# Patient Record
Sex: Female | Born: 1960 | Race: Black or African American | Hispanic: No | Marital: Married | Smoking: Never smoker
Health system: Southern US, Community
[De-identification: ages and names within clinical notes are randomized; demographics above are authoritative.]

## PROBLEM LIST (undated history)

## (undated) DIAGNOSIS — L89309 Pressure ulcer of unspecified buttock, unspecified stage: Secondary | ICD-10-CM

## (undated) DIAGNOSIS — M069 Rheumatoid arthritis, unspecified: Secondary | ICD-10-CM

## (undated) DIAGNOSIS — L8990 Pressure ulcer of unspecified site, unspecified stage: Secondary | ICD-10-CM

## (undated) DIAGNOSIS — A492 Hemophilus influenzae infection, unspecified site: Secondary | ICD-10-CM

## (undated) DIAGNOSIS — D649 Anemia, unspecified: Secondary | ICD-10-CM

## (undated) DIAGNOSIS — K219 Gastro-esophageal reflux disease without esophagitis: Secondary | ICD-10-CM

## (undated) DIAGNOSIS — I7300 Raynaud's syndrome without gangrene: Secondary | ICD-10-CM

## (undated) DIAGNOSIS — I10 Essential (primary) hypertension: Secondary | ICD-10-CM

## (undated) DIAGNOSIS — J189 Pneumonia, unspecified organism: Secondary | ICD-10-CM

## (undated) DIAGNOSIS — F329 Major depressive disorder, single episode, unspecified: Secondary | ICD-10-CM

## (undated) DIAGNOSIS — I739 Peripheral vascular disease, unspecified: Secondary | ICD-10-CM

## (undated) DIAGNOSIS — R569 Unspecified convulsions: Secondary | ICD-10-CM

## (undated) DIAGNOSIS — M1990 Unspecified osteoarthritis, unspecified site: Secondary | ICD-10-CM

## (undated) DIAGNOSIS — M8580 Other specified disorders of bone density and structure, unspecified site: Secondary | ICD-10-CM

## (undated) DIAGNOSIS — J849 Interstitial pulmonary disease, unspecified: Secondary | ICD-10-CM

## (undated) DIAGNOSIS — F32A Depression, unspecified: Secondary | ICD-10-CM

## (undated) DIAGNOSIS — E119 Type 2 diabetes mellitus without complications: Secondary | ICD-10-CM

## (undated) DIAGNOSIS — K5900 Constipation, unspecified: Secondary | ICD-10-CM

## (undated) DIAGNOSIS — M797 Fibromyalgia: Secondary | ICD-10-CM

## (undated) DIAGNOSIS — R51 Headache: Secondary | ICD-10-CM

## (undated) DIAGNOSIS — A419 Sepsis, unspecified organism: Secondary | ICD-10-CM

## (undated) DIAGNOSIS — S31829A Unspecified open wound of left buttock, initial encounter: Secondary | ICD-10-CM

## (undated) DIAGNOSIS — R0602 Shortness of breath: Secondary | ICD-10-CM

## (undated) DIAGNOSIS — M3320 Polymyositis, organ involvement unspecified: Secondary | ICD-10-CM

## (undated) DIAGNOSIS — F419 Anxiety disorder, unspecified: Secondary | ICD-10-CM

## (undated) HISTORY — PX: TUBAL LIGATION: SHX77

## (undated) HISTORY — DX: Unspecified osteoarthritis, unspecified site: M19.90

## (undated) HISTORY — DX: Fibromyalgia: M79.7

## (undated) HISTORY — DX: Other specified disorders of bone density and structure, unspecified site: M85.80

## (undated) HISTORY — PX: HYSTEROSCOPY: SHX211

## (undated) HISTORY — PX: TONSILLECTOMY: SUR1361

## (undated) HISTORY — PX: COLONOSCOPY: SHX174

## (undated) HISTORY — DX: Polymyositis, organ involvement unspecified: M33.20

---

## 1999-01-20 ENCOUNTER — Encounter: Payer: Self-pay | Admitting: Family Medicine

## 1999-01-20 ENCOUNTER — Ambulatory Visit (HOSPITAL_COMMUNITY): Admission: RE | Admit: 1999-01-20 | Discharge: 1999-01-20 | Payer: Self-pay | Admitting: Family Medicine

## 2000-02-03 ENCOUNTER — Encounter: Admission: RE | Admit: 2000-02-03 | Discharge: 2000-02-03 | Payer: Self-pay | Admitting: Rheumatology

## 2000-02-03 ENCOUNTER — Encounter: Payer: Self-pay | Admitting: Rheumatology

## 2000-12-06 ENCOUNTER — Other Ambulatory Visit: Admission: RE | Admit: 2000-12-06 | Discharge: 2000-12-06 | Payer: Self-pay | Admitting: Obstetrics and Gynecology

## 2001-08-08 ENCOUNTER — Ambulatory Visit (HOSPITAL_COMMUNITY): Admission: RE | Admit: 2001-08-08 | Discharge: 2001-08-08 | Payer: Self-pay

## 2003-09-10 ENCOUNTER — Other Ambulatory Visit: Admission: RE | Admit: 2003-09-10 | Discharge: 2003-09-10 | Payer: Self-pay | Admitting: Obstetrics and Gynecology

## 2003-12-12 ENCOUNTER — Ambulatory Visit (HOSPITAL_BASED_OUTPATIENT_CLINIC_OR_DEPARTMENT_OTHER): Admission: RE | Admit: 2003-12-12 | Discharge: 2003-12-12 | Payer: Self-pay | Admitting: General Surgery

## 2003-12-12 ENCOUNTER — Encounter (INDEPENDENT_AMBULATORY_CARE_PROVIDER_SITE_OTHER): Payer: Self-pay | Admitting: Specialist

## 2004-09-13 ENCOUNTER — Other Ambulatory Visit: Admission: RE | Admit: 2004-09-13 | Discharge: 2004-09-13 | Payer: Self-pay | Admitting: Obstetrics and Gynecology

## 2004-09-27 ENCOUNTER — Ambulatory Visit (HOSPITAL_COMMUNITY): Admission: RE | Admit: 2004-09-27 | Discharge: 2004-09-27 | Payer: Self-pay | Admitting: Gynecology

## 2004-10-08 ENCOUNTER — Encounter: Admission: RE | Admit: 2004-10-08 | Discharge: 2004-10-08 | Payer: Self-pay | Admitting: Gynecology

## 2004-11-01 ENCOUNTER — Encounter: Admission: RE | Admit: 2004-11-01 | Discharge: 2004-11-01 | Payer: Self-pay | Admitting: Diagnostic Radiology

## 2004-12-06 ENCOUNTER — Encounter: Admission: RE | Admit: 2004-12-06 | Discharge: 2004-12-06 | Payer: Self-pay | Admitting: Internal Medicine

## 2005-02-01 ENCOUNTER — Encounter: Admission: RE | Admit: 2005-02-01 | Discharge: 2005-02-01 | Payer: Self-pay | Admitting: Internal Medicine

## 2005-04-20 ENCOUNTER — Encounter: Admission: RE | Admit: 2005-04-20 | Discharge: 2005-04-20 | Payer: Self-pay | Admitting: Internal Medicine

## 2005-12-02 ENCOUNTER — Other Ambulatory Visit: Admission: RE | Admit: 2005-12-02 | Discharge: 2005-12-02 | Payer: Self-pay | Admitting: Obstetrics and Gynecology

## 2006-01-23 ENCOUNTER — Ambulatory Visit: Payer: Self-pay | Admitting: Internal Medicine

## 2006-02-06 ENCOUNTER — Encounter (INDEPENDENT_AMBULATORY_CARE_PROVIDER_SITE_OTHER): Payer: Self-pay | Admitting: *Deleted

## 2006-02-06 ENCOUNTER — Ambulatory Visit: Payer: Self-pay | Admitting: Internal Medicine

## 2006-02-17 ENCOUNTER — Encounter: Admission: RE | Admit: 2006-02-17 | Discharge: 2006-02-17 | Payer: Self-pay | Admitting: Internal Medicine

## 2006-04-21 ENCOUNTER — Emergency Department (HOSPITAL_COMMUNITY): Admission: EM | Admit: 2006-04-21 | Discharge: 2006-04-21 | Payer: Self-pay | Admitting: *Deleted

## 2007-02-01 ENCOUNTER — Other Ambulatory Visit: Admission: RE | Admit: 2007-02-01 | Discharge: 2007-02-01 | Payer: Self-pay | Admitting: Obstetrics and Gynecology

## 2007-03-08 ENCOUNTER — Ambulatory Visit (HOSPITAL_BASED_OUTPATIENT_CLINIC_OR_DEPARTMENT_OTHER): Admission: RE | Admit: 2007-03-08 | Discharge: 2007-03-08 | Payer: Self-pay | Admitting: Obstetrics and Gynecology

## 2007-03-08 ENCOUNTER — Encounter (INDEPENDENT_AMBULATORY_CARE_PROVIDER_SITE_OTHER): Payer: Self-pay | Admitting: Specialist

## 2007-08-24 ENCOUNTER — Encounter: Admission: RE | Admit: 2007-08-24 | Discharge: 2007-08-24 | Payer: Self-pay | Admitting: Internal Medicine

## 2007-09-06 ENCOUNTER — Encounter: Admission: RE | Admit: 2007-09-06 | Discharge: 2007-09-06 | Payer: Self-pay | Admitting: Internal Medicine

## 2007-10-08 ENCOUNTER — Encounter: Admission: RE | Admit: 2007-10-08 | Discharge: 2007-10-23 | Payer: Self-pay | Admitting: Internal Medicine

## 2008-05-16 ENCOUNTER — Encounter: Admission: RE | Admit: 2008-05-16 | Discharge: 2008-05-16 | Payer: Self-pay | Admitting: Internal Medicine

## 2008-09-22 ENCOUNTER — Encounter: Admission: RE | Admit: 2008-09-22 | Discharge: 2008-09-22 | Payer: Self-pay | Admitting: Internal Medicine

## 2009-03-16 ENCOUNTER — Emergency Department (HOSPITAL_COMMUNITY): Admission: EM | Admit: 2009-03-16 | Discharge: 2009-03-16 | Payer: Self-pay | Admitting: Emergency Medicine

## 2009-04-09 ENCOUNTER — Encounter: Admission: RE | Admit: 2009-04-09 | Discharge: 2009-04-09 | Payer: Self-pay | Admitting: Internal Medicine

## 2009-12-16 ENCOUNTER — Ambulatory Visit: Payer: Self-pay | Admitting: Women's Health

## 2009-12-16 ENCOUNTER — Other Ambulatory Visit: Admission: RE | Admit: 2009-12-16 | Discharge: 2009-12-16 | Payer: Self-pay | Admitting: Obstetrics and Gynecology

## 2010-08-27 ENCOUNTER — Encounter: Admission: RE | Admit: 2010-08-27 | Discharge: 2010-08-27 | Payer: Self-pay | Admitting: Specialist

## 2010-11-14 ENCOUNTER — Encounter: Payer: Self-pay | Admitting: Internal Medicine

## 2010-11-23 ENCOUNTER — Encounter
Admission: RE | Admit: 2010-11-23 | Discharge: 2010-11-23 | Payer: Self-pay | Source: Home / Self Care | Attending: Internal Medicine | Admitting: Internal Medicine

## 2010-12-30 ENCOUNTER — Other Ambulatory Visit: Payer: Self-pay | Admitting: Women's Health

## 2010-12-30 ENCOUNTER — Encounter (INDEPENDENT_AMBULATORY_CARE_PROVIDER_SITE_OTHER): Payer: BC Managed Care – PPO | Admitting: Women's Health

## 2010-12-30 ENCOUNTER — Other Ambulatory Visit (HOSPITAL_COMMUNITY)
Admission: RE | Admit: 2010-12-30 | Discharge: 2010-12-30 | Disposition: A | Discharge status: Home / Self Care | Payer: BC Managed Care – PPO | Source: Ambulatory Visit | Attending: Obstetrics and Gynecology | Admitting: Obstetrics and Gynecology

## 2010-12-30 DIAGNOSIS — Z124 Encounter for screening for malignant neoplasm of cervix: Secondary | ICD-10-CM | POA: Insufficient documentation

## 2010-12-30 DIAGNOSIS — Z01419 Encounter for gynecological examination (general) (routine) without abnormal findings: Secondary | ICD-10-CM

## 2011-01-12 ENCOUNTER — Emergency Department (HOSPITAL_COMMUNITY)
Admission: EM | Admit: 2011-01-12 | Discharge: 2011-01-12 | Disposition: A | Discharge status: Home / Self Care | Payer: No Typology Code available for payment source | Attending: Emergency Medicine | Admitting: Emergency Medicine

## 2011-01-12 ENCOUNTER — Emergency Department (HOSPITAL_COMMUNITY): Payer: No Typology Code available for payment source

## 2011-01-12 DIAGNOSIS — Y92410 Unspecified street and highway as the place of occurrence of the external cause: Secondary | ICD-10-CM | POA: Insufficient documentation

## 2011-01-12 DIAGNOSIS — M549 Dorsalgia, unspecified: Secondary | ICD-10-CM | POA: Insufficient documentation

## 2011-01-12 DIAGNOSIS — M542 Cervicalgia: Secondary | ICD-10-CM | POA: Insufficient documentation

## 2011-01-12 DIAGNOSIS — E119 Type 2 diabetes mellitus without complications: Secondary | ICD-10-CM | POA: Insufficient documentation

## 2011-01-12 DIAGNOSIS — S139XXA Sprain of joints and ligaments of unspecified parts of neck, initial encounter: Secondary | ICD-10-CM | POA: Insufficient documentation

## 2011-02-01 LAB — BASIC METABOLIC PANEL
BUN: 16 mg/dL (ref 6–23)
Chloride: 97 mEq/L (ref 96–112)
Creatinine, Ser: 1.13 mg/dL (ref 0.4–1.2)
Glucose, Bld: 186 mg/dL — ABNORMAL HIGH (ref 70–99)
Potassium: 4.5 mEq/L (ref 3.5–5.1)

## 2011-02-01 LAB — URINALYSIS, ROUTINE W REFLEX MICROSCOPIC
Glucose, UA: NEGATIVE mg/dL
Hgb urine dipstick: NEGATIVE
Leukocytes, UA: NEGATIVE
Protein, ur: 100 mg/dL — AB
Specific Gravity, Urine: 1.038 — ABNORMAL HIGH (ref 1.005–1.030)
Urobilinogen, UA: 2 mg/dL — ABNORMAL HIGH (ref 0.0–1.0)

## 2011-02-01 LAB — DIFFERENTIAL
Basophils Absolute: 0 10*3/uL (ref 0.0–0.1)
Eosinophils Relative: 0 % (ref 0–5)
Lymphocytes Relative: 2 % — ABNORMAL LOW (ref 12–46)
Neutro Abs: 17.2 10*3/uL — ABNORMAL HIGH (ref 1.7–7.7)
Neutrophils Relative %: 96 % — ABNORMAL HIGH (ref 43–77)

## 2011-02-01 LAB — CBC
Platelets: 223 10*3/uL (ref 150–400)
RDW: 16.5 % — ABNORMAL HIGH (ref 11.5–15.5)

## 2011-02-01 LAB — URINE MICROSCOPIC-ADD ON

## 2011-03-11 NOTE — Op Note (Signed)
NAME:  JUDEEN, GERALDS                            ACCOUNT NO.:  [id number]   MEDICAL RECORD NO.:  [id number]                   PATIENT TYPE:  AMB   LOCATION:  DSC                                  FACILITY:  [facility]   PHYSICIAN:  Gita Kudo, M.D.              DATE OF BIRTH:  [birth year]   DATE OF PROCEDURE:  12/12/2003  DATE OF DISCHARGE:                                 OPERATIVE REPORT   OPERATION PERFORMED:  Right deltoid muscle biopsy.   SURGEON:  Gita Kudo, M.D.   ANESTHESIA:  1% Xylocaine.   INDICATIONS FOR PROCEDURE:  The patient is a 50 year old lady with history  of muscle pains and weakness on prednisone.  Needs muscle biopsy to  establish diagnosis.   OPERATIVE FINDINGS:  The right deltoid muscle looked normal.   DESCRIPTION OF PROCEDURE:  The patient was positioned, prepped and draped in  standard fashion.  1% Xylocaine was infiltrated and after good analgesia, a  vertical incision made, self-retaining retractor placed and the deltoid  fascia incised.  A clamp placed on the deltoid muscle and a 1.5 cm segment  removed.  A very good sample.  Placed in saline and sent for immediate  transport to pathology where it will be sent to [facility] in special medium for  muscle work-up.  The wound was closed in layers with 3-0 Vicryl, 4-0 nylon,  Steri-Strips.  Sterile absorbent dressing applied.   COMPLICATIONS:  None.                                               Gita Kudo, M.D.    MRL/MEDQ  D:  12/12/2003  T:  12/13/2003  Job:  [id number]

## 2011-03-11 NOTE — Op Note (Signed)
NAME:  Allison Beasley, Allison Beasley                  ACCOUNT NO.:  [id number]   MEDICAL RECORD NO.:  [id number]          PATIENT TYPE:  AMB   LOCATION:  [facility]                         FACILITY:  [facility]   PHYSICIAN:  Daniel L. Gottsegen, M.D.DATE OF BIRTH:  [birth year]   DATE OF PROCEDURE:  03/08/2007  DATE OF DISCHARGE:                               OPERATIVE REPORT   POSTOPERATIVE DIAGNOSIS:  Dysfunctional uterine bleeding with  endometrial polyps.   POSTOPERATIVE DIAGNOSIS:  Dysfunctional uterine bleeding with  endometrial polyps.   NAME OF OPERATION:  D&C, hysteroscopy and excision of endometrial  polyps.   SURGEON:  Dr. Eda Paschal.   ANESTHESIA:  General.   INDICATIONS:  The patient is a 50 year old female with severe  menorrhagia associated with anemia who enters the hospital now for [facility],  hysteroscopy.  Saline infusion histogram was done in the office and the  patient had multiple endometrial polyps and these will be removed along  with a D&C.   FINDINGS:  External is normal.  BUS is normal.  Vaginal is normal.  Cervix is clean.  Uterus is normal size and shape.  There is less than  first-degree descensus.  Adnexa failed to reveal masses.  At the time of  hysteroscopy, the patient had a large endometrial polyp coming off the  posterior wall of the fundus and several smaller ones coming off the  anterior wall of the fundus.  The largest one was almost 2 cm.  The  others were approximately 0.5 to 1 cm.  Once they had all been removed,  top of the fundus, tubal ostia, anterior and posterior walls of the  fundus, lower uterine segment, and endocervical canal were all free of  disease.   PROCEDURE:  After adequate general anesthesia the patient was placed in  the dorsal lithotomy position, prepped and draped in the usual sterile  manner.  A single-tooth tenaculum was placed on the anterior lip of the  cervix.  The cervix was dilated to a #31 Pratt dilator.  A hysteroscopic  resectoscope was  introduced using 3% sorbitol to expand the intrauterine  cavity and a camera for magnification.  A  9 degree wire loop with  appropriate settings was used.  The polyps were excised with the wire  loop.  A fractional curettage was done.  All specimens were sent to  pathology for tissue diagnosis.   At the termination of the procedure the patient had a normal  intrauterine cavity.  Blood loss was minimal.  Fluid deficit was less  than 100 mL.  The patient tolerated the procedure well and left the  operating room in satisfactory condition.      Daniel L. Eda Paschal, M.D.     Tonette Bihari  D:  03/08/2007  T:  03/08/2007  Job:  [id number]

## 2011-04-25 ENCOUNTER — Other Ambulatory Visit: Payer: Self-pay | Admitting: Internal Medicine

## 2011-04-25 DIAGNOSIS — R7989 Other specified abnormal findings of blood chemistry: Secondary | ICD-10-CM

## 2011-05-30 ENCOUNTER — Ambulatory Visit
Admission: RE | Admit: 2011-05-30 | Discharge: 2011-05-30 | Disposition: A | Discharge status: Home / Self Care | Payer: No Typology Code available for payment source | Source: Ambulatory Visit | Attending: Internal Medicine | Admitting: Internal Medicine

## 2011-05-30 ENCOUNTER — Ambulatory Visit
Admission: RE | Admit: 2011-05-30 | Discharge: 2011-05-30 | Disposition: A | Discharge status: Home / Self Care | Payer: Medicare Other | Source: Ambulatory Visit | Attending: Internal Medicine | Admitting: Internal Medicine

## 2011-05-30 DIAGNOSIS — R7989 Other specified abnormal findings of blood chemistry: Secondary | ICD-10-CM

## 2011-08-15 DIAGNOSIS — M3320 Polymyositis, organ involvement unspecified: Secondary | ICD-10-CM | POA: Insufficient documentation

## 2011-08-15 DIAGNOSIS — M359 Systemic involvement of connective tissue, unspecified: Secondary | ICD-10-CM | POA: Insufficient documentation

## 2011-10-25 HISTORY — PX: OTHER SURGICAL HISTORY: SHX169

## 2011-12-16 ENCOUNTER — Other Ambulatory Visit (HOSPITAL_COMMUNITY): Payer: Self-pay | Admitting: Internal Medicine

## 2011-12-16 DIAGNOSIS — Z1231 Encounter for screening mammogram for malignant neoplasm of breast: Secondary | ICD-10-CM

## 2011-12-20 ENCOUNTER — Ambulatory Visit (HOSPITAL_COMMUNITY)
Admission: RE | Admit: 2011-12-20 | Discharge: 2011-12-20 | Disposition: A | Discharge status: Home / Self Care | Payer: BC Managed Care – PPO | Source: Ambulatory Visit | Attending: Internal Medicine | Admitting: Internal Medicine

## 2011-12-20 DIAGNOSIS — Z1231 Encounter for screening mammogram for malignant neoplasm of breast: Secondary | ICD-10-CM | POA: Insufficient documentation

## 2011-12-23 ENCOUNTER — Other Ambulatory Visit: Payer: Self-pay | Admitting: Internal Medicine

## 2011-12-23 DIAGNOSIS — R928 Other abnormal and inconclusive findings on diagnostic imaging of breast: Secondary | ICD-10-CM

## 2012-01-04 ENCOUNTER — Ambulatory Visit
Admission: RE | Admit: 2012-01-04 | Discharge: 2012-01-04 | Disposition: A | Discharge status: Home / Self Care | Payer: BC Managed Care – PPO | Source: Ambulatory Visit | Attending: Internal Medicine | Admitting: Internal Medicine

## 2012-01-04 DIAGNOSIS — R928 Other abnormal and inconclusive findings on diagnostic imaging of breast: Secondary | ICD-10-CM

## 2012-03-24 DIAGNOSIS — J189 Pneumonia, unspecified organism: Secondary | ICD-10-CM

## 2012-03-24 HISTORY — DX: Pneumonia, unspecified organism: J18.9

## 2012-04-09 DIAGNOSIS — J189 Pneumonia, unspecified organism: Secondary | ICD-10-CM | POA: Insufficient documentation

## 2012-05-09 ENCOUNTER — Encounter: Payer: Self-pay | Admitting: *Deleted

## 2012-05-11 ENCOUNTER — Encounter: Payer: BC Managed Care – PPO | Admitting: Women's Health

## 2012-06-28 ENCOUNTER — Ambulatory Visit (INDEPENDENT_AMBULATORY_CARE_PROVIDER_SITE_OTHER): Payer: BC Managed Care – PPO | Admitting: Gynecology

## 2012-06-28 ENCOUNTER — Encounter: Payer: Self-pay | Admitting: Gynecology

## 2012-06-28 DIAGNOSIS — N920 Excessive and frequent menstruation with regular cycle: Secondary | ICD-10-CM

## 2012-06-28 DIAGNOSIS — N926 Irregular menstruation, unspecified: Secondary | ICD-10-CM

## 2012-06-28 LAB — CBC WITH DIFFERENTIAL/PLATELET
Basophils Absolute: 0 10*3/uL (ref 0.0–0.1)
HCT: 35.3 % — ABNORMAL LOW (ref 36.0–46.0)
Hemoglobin: 11.7 g/dL — ABNORMAL LOW (ref 12.0–15.0)
Lymphocytes Relative: 13 % (ref 12–46)
Monocytes Absolute: 0.2 10*3/uL (ref 0.1–1.0)
Neutro Abs: 6.7 10*3/uL (ref 1.7–7.7)
RDW: 15.9 % — ABNORMAL HIGH (ref 11.5–15.5)
WBC: 8 10*3/uL (ref 4.0–10.5)

## 2012-06-28 LAB — TSH: TSH: 1.017 u[IU]/mL (ref 0.350–4.500)

## 2012-06-28 NOTE — Patient Instructions (Signed)
Follow up for ultrasound as scheduled 

## 2012-06-28 NOTE — Progress Notes (Signed)
Patient presents complaining of irregular menses and menorrhagia. Patient notes regular menses up until approximately 3 years ago when her periods became more irregular. She would skip up to several months. She did have episodes of hot flushes and night sweats but they have resolved over the past year. Her menses occurred in November and then had a follow up menses in May both of which were heavy and then started last week with heavy bleeding over the past week and a lot of cramping. She feels tired and weak. Bleeding has appeared to diminish it the last day or 2. Is actively being followed by rheumatology for polymyositis fibromyalgia osteoarthritis osteopenia currently on prednisone and Fosamax. Does have history of small myoma on ultrasound and a small endometrial polyp that was removed in 2008.  Past medical history,surgical history, medications, allergies, family history and social history were all reviewed and documented in the EPIC chart. ROS:  Was performed and pertinent positives and negatives are included in the history.  Exam with Sherrilyn Rist assistant Spine straight no CVA tenderness Abdomen soft nontender without masses guarding rebound organomegaly Pelvic external BUS vagina with scant bleeding. Cervix normal scant bleeding. Uterus normal size midline mobile nontender. Adnexa without masses or tenderness.  Assessment and plan: Irregular menses heavy history of endometrial polyp and small myoma. History of prior hot flashes/night sweats but now resolved. We'll check baseline FSH TSH and CBC. Sonohysterogram rule out endometrial polyp/myomas. Various scenarios reviewed with the patient and she'll follow up for her lab work and ultrasound and then we'll go from there. She is overdue for her annual exam and also schedule that for later this month.

## 2012-07-04 ENCOUNTER — Other Ambulatory Visit: Payer: Self-pay | Admitting: Gynecology

## 2012-07-04 ENCOUNTER — Ambulatory Visit (INDEPENDENT_AMBULATORY_CARE_PROVIDER_SITE_OTHER): Payer: BC Managed Care – PPO

## 2012-07-04 ENCOUNTER — Ambulatory Visit (INDEPENDENT_AMBULATORY_CARE_PROVIDER_SITE_OTHER): Payer: BC Managed Care – PPO | Admitting: Gynecology

## 2012-07-04 ENCOUNTER — Encounter: Payer: Self-pay | Admitting: Gynecology

## 2012-07-04 DIAGNOSIS — N950 Postmenopausal bleeding: Secondary | ICD-10-CM

## 2012-07-04 DIAGNOSIS — N8000 Endometriosis of the uterus, unspecified: Secondary | ICD-10-CM

## 2012-07-04 DIAGNOSIS — Q505 Embryonic cyst of broad ligament: Secondary | ICD-10-CM

## 2012-07-04 DIAGNOSIS — N915 Oligomenorrhea, unspecified: Secondary | ICD-10-CM

## 2012-07-04 DIAGNOSIS — N926 Irregular menstruation, unspecified: Secondary | ICD-10-CM

## 2012-07-04 DIAGNOSIS — N83339 Acquired atrophy of ovary and fallopian tube, unspecified side: Secondary | ICD-10-CM

## 2012-07-04 DIAGNOSIS — N920 Excessive and frequent menstruation with regular cycle: Secondary | ICD-10-CM

## 2012-07-04 DIAGNOSIS — Q504 Embryonic cyst of fallopian tube: Secondary | ICD-10-CM

## 2012-07-04 NOTE — Patient Instructions (Signed)
Keep menstrual calendar. As long as less frequent but regular menses we will watch. If you go more than one year without bleeding and then bleed or have prolonged atypical bleeding you need to call the office. Follow up for your annual exam this coming month as you have scheduled.

## 2012-07-04 NOTE — Progress Notes (Signed)
Patient presents for sonohysterogram due to irregular menses and menorrhagia. Patient notes regular menses up until approximately 3 years ago when her periods became more irregular. She would skip up to several months. She did have episodes of hot flushes and night sweats but they have resolved over the past year. Her menses occurred in November and then had a follow up menses in May both of which were heavy and then started last week with heavy bleeding over the past week and a lot of cramping.  Recent lab work shows FSH 46 and hemoglobin 11.7  Ultrasound shows normal uterine size and echotexture. Endometrial echo of 5.5 mm. Right and left ovaries appear atrophic and normal. Cul-de-sac negative. Sonohysterogram performed, sterile technique, easy catheter introduction, good distention with no abnormalities. Endometrial sample taken. Patient tolerated well.  Assessment and plan: Perimenopausal irregular menses. Recommend menstrual calendar. As long as less frequent but "regular" menses we'll monitor. If prolonged or atypical bleeding or she goes more than one year without bleeding and then bleeds she knows to call us.

## 2012-07-06 ENCOUNTER — Encounter: Payer: Self-pay | Admitting: *Deleted

## 2012-07-06 NOTE — Progress Notes (Unsigned)
Patient ID: Allison Beasley, female   DOB: Mar 22, 1961, 51 y.o.   MRN: [id number] Lanice Schwab from [facility] called to verfiy BX site on 07/04/12 informed endometrium BX per office note.

## 2012-07-23 ENCOUNTER — Encounter: Payer: BC Managed Care – PPO | Admitting: Women's Health

## 2012-07-30 DIAGNOSIS — K219 Gastro-esophageal reflux disease without esophagitis: Secondary | ICD-10-CM | POA: Insufficient documentation

## 2012-07-30 DIAGNOSIS — J849 Interstitial pulmonary disease, unspecified: Secondary | ICD-10-CM | POA: Insufficient documentation

## 2012-09-27 ENCOUNTER — Other Ambulatory Visit: Payer: Self-pay | Admitting: Internal Medicine

## 2012-09-27 DIAGNOSIS — L0390 Cellulitis, unspecified: Secondary | ICD-10-CM

## 2012-10-02 ENCOUNTER — Ambulatory Visit
Admission: RE | Admit: 2012-10-02 | Discharge: 2012-10-02 | Disposition: A | Discharge status: Home / Self Care | Payer: BC Managed Care – PPO | Source: Ambulatory Visit | Attending: Internal Medicine | Admitting: Internal Medicine

## 2012-10-02 DIAGNOSIS — L0390 Cellulitis, unspecified: Secondary | ICD-10-CM

## 2012-10-02 MED ORDER — GADOBENATE DIMEGLUMINE 529 MG/ML IV SOLN
13.0000 mL | Freq: Once | INTRAVENOUS | Status: AC | PRN
  Administered 2012-10-02: 13 mL via INTRAVENOUS

## 2012-10-25 DIAGNOSIS — D509 Iron deficiency anemia, unspecified: Secondary | ICD-10-CM | POA: Insufficient documentation

## 2012-11-12 ENCOUNTER — Encounter (HOSPITAL_COMMUNITY): Payer: Self-pay | Admitting: *Deleted

## 2012-11-12 ENCOUNTER — Emergency Department (HOSPITAL_COMMUNITY): Payer: BC Managed Care – PPO

## 2012-11-12 ENCOUNTER — Emergency Department (HOSPITAL_COMMUNITY)
Admission: EM | Admit: 2012-11-12 | Discharge: 2012-11-12 | Disposition: A | Discharge status: Home / Self Care | Payer: BC Managed Care – PPO | Attending: Emergency Medicine | Admitting: Emergency Medicine

## 2012-11-12 DIAGNOSIS — L98499 Non-pressure chronic ulcer of skin of other sites with unspecified severity: Secondary | ICD-10-CM

## 2012-11-12 DIAGNOSIS — E119 Type 2 diabetes mellitus without complications: Secondary | ICD-10-CM | POA: Insufficient documentation

## 2012-11-12 DIAGNOSIS — M79676 Pain in unspecified toe(s): Secondary | ICD-10-CM

## 2012-11-12 DIAGNOSIS — M3320 Polymyositis, organ involvement unspecified: Secondary | ICD-10-CM | POA: Insufficient documentation

## 2012-11-12 DIAGNOSIS — Z79899 Other long term (current) drug therapy: Secondary | ICD-10-CM | POA: Insufficient documentation

## 2012-11-12 DIAGNOSIS — M1990 Unspecified osteoarthritis, unspecified site: Secondary | ICD-10-CM | POA: Insufficient documentation

## 2012-11-12 DIAGNOSIS — M79609 Pain in unspecified limb: Secondary | ICD-10-CM | POA: Insufficient documentation

## 2012-11-12 DIAGNOSIS — Z8679 Personal history of other diseases of the circulatory system: Secondary | ICD-10-CM | POA: Insufficient documentation

## 2012-11-12 DIAGNOSIS — L97509 Non-pressure chronic ulcer of other part of unspecified foot with unspecified severity: Secondary | ICD-10-CM | POA: Insufficient documentation

## 2012-11-12 DIAGNOSIS — Z8739 Personal history of other diseases of the musculoskeletal system and connective tissue: Secondary | ICD-10-CM | POA: Insufficient documentation

## 2012-11-12 DIAGNOSIS — R21 Rash and other nonspecific skin eruption: Secondary | ICD-10-CM | POA: Insufficient documentation

## 2012-11-12 HISTORY — DX: Raynaud's syndrome without gangrene: I73.00

## 2012-11-12 LAB — CBC WITH DIFFERENTIAL/PLATELET
Basophils Absolute: 0 10*3/uL (ref 0.0–0.1)
HCT: 35.2 % — ABNORMAL LOW (ref 36.0–46.0)
Lymphocytes Relative: 10 % — ABNORMAL LOW (ref 12–46)
Neutro Abs: 8.3 10*3/uL — ABNORMAL HIGH (ref 1.7–7.7)
Neutrophils Relative %: 87 % — ABNORMAL HIGH (ref 43–77)
Platelets: 328 10*3/uL (ref 150–400)
RDW: 16.5 % — ABNORMAL HIGH (ref 11.5–15.5)
WBC: 9.6 10*3/uL (ref 4.0–10.5)

## 2012-11-12 LAB — COMPREHENSIVE METABOLIC PANEL
ALT: 58 U/L — ABNORMAL HIGH (ref 0–35)
AST: 113 U/L — ABNORMAL HIGH (ref 0–37)
CO2: 21 mEq/L (ref 19–32)
Chloride: 96 mEq/L (ref 96–112)
GFR calc non Af Amer: >90 mL/min (ref >90)
Potassium: 3.6 mEq/L (ref 3.5–5.1)
Sodium: 133 mEq/L — ABNORMAL LOW (ref 135–145)
Total Bilirubin: 0.5 mg/dL (ref 0.3–1.2)

## 2012-11-12 MED ORDER — HYDROMORPHONE HCL PF 1 MG/ML IJ SOLN
1.0000 mg | Freq: Once | INTRAMUSCULAR | Status: AC
  Administered 2012-11-12: 1 mg via INTRAVENOUS
  Filled 2012-11-12: qty 1

## 2012-11-12 MED ORDER — DIPHENHYDRAMINE HCL 50 MG/ML IJ SOLN
25.0000 mg | Freq: Once | INTRAMUSCULAR | Status: AC
  Administered 2012-11-12: 25 mg via INTRAVENOUS
  Filled 2012-11-12: qty 1

## 2012-11-12 MED ORDER — ONDANSETRON HCL 4 MG/2ML IJ SOLN
4.0000 mg | Freq: Once | INTRAMUSCULAR | Status: AC
  Administered 2012-11-12: 4 mg via INTRAVENOUS
  Filled 2012-11-12: qty 2

## 2012-11-12 NOTE — ED Notes (Signed)
[id number]<NF> Expected date:<BR> Expected time:<BR> Means of arrival:<BR> Comments:<BR> Amputated toe this past week and has pain/ems

## 2012-11-12 NOTE — ED Provider Notes (Signed)
History     CSN: [id number]  Arrival date & time 11/12/12  4782   First MD Initiated Contact with Patient 11/12/12 1002      Chief Complaint  Patient presents with  . Toe Pain    (Consider location/radiation/quality/duration/timing/severity/associated sxs/prior treatment) HPI Allison Beasley is a 52 y.o. female who presents to ED with complaint of toe pain. Pt with complicating past medical hisotry which includes diabetes, polymyositis, calsinosis of unknow etiology, raynauds disease, here from her PCP's office for possible toe ischemia/infection. Pt states toe pain started 4months ago, after she got a pedicure. She was seen by a podiatrist a month later, who  was told it was ingrown, and then infected, and was partially excised. Pt has been on multiple different antibiotics since. States pain and infection worsening. States about a month ago, tip of the toe started turning black, and has been progressing since. She was seen by Dr. Lestine Box, orthopedics, and had her toe nail removed. After symptoms not improving, She was admitted 10 days ago, [facility] where she had multiple studies done (records requested) and treated with IV antibiotics. She was discharged home with diagnosis of chronic non healing toe wound, which they thought is most likely due to vascular problem. She is not on any antibiotics currently. She states toe pain and redness/discoloration is worsening. Today went for recheck at her PCP's office, sent here. Pt also areports rash all over her torso, upper arms, upper legs, that is spreading. Thinks started while hospitalized. Rash is red, not itchy. In addition, pt fell few days ago at home, after her legs "gave out," and injured left upper arm. States pain with palpation and movement.    Past Medical History  Diagnosis Date  . Osteoarthritis   . Polymyositis   . Diabetes mellitus   . Fibromyalgia   . Osteopenia   . Raynaud phenomenon   . Heart disease      Past Surgical History  Procedure Date  . Cesarean section     x3  . Tubal ligation   . Hysteroscopy     w/ D&C with excision of endometrial polyps    Family History  Problem Relation Age of Onset  . Diabetes Mother   . Hypertension Mother   . Diabetes Father   . Heart disease Father   . Hypertension Sister     History  Substance Use Topics  . Smoking status: Never Smoker   . Smokeless tobacco: Never Used  . Alcohol Use: Yes     Comment: occ    OB History    Grav Para Term Preterm Abortions TAB SAB Ect Mult Living   4 3   1     3       Review of Systems  Constitutional: Negative for fever and chills.  Respiratory: Negative.   Cardiovascular: Negative.   Musculoskeletal: Positive for myalgias, joint swelling and arthralgias.       Positive for toe pain and discoloration  Skin: Positive for rash.  Neurological: Positive for weakness. Negative for numbness.  All other systems reviewed and are negative.    Allergies  Review of patient's allergies indicates no known allergies.  Home Medications   Current Outpatient Rx  Name  Route  Sig  Dispense  Refill  . ALENDRONATE SODIUM 70 MG PO TABS   Oral   Take 70 mg by mouth every 7 (seven) days. Take with a full glass of water on an empty stomach.         Marland Kitchen  METHYLPREDNISOLONE 4 MG PO TABS   Oral   Take 4 mg by mouth daily.           BP 129/86  Pulse 78  Temp 98.5 F (36.9 C) (Oral)  Resp 18  SpO2 100%  Physical Exam  Nursing note and vitals reviewed. Constitutional: She appears well-developed and well-nourished.       Uncomfortable appearing  HENT:  Head: Normocephalic.  Eyes: Conjunctivae normal are normal.  Neck: Neck supple.  Cardiovascular: Normal rate, regular rhythm and normal heart sounds.   Pulmonary/Chest: Effort normal and breath sounds normal. No respiratory distress. She has no wheezes. She has no rales.  Abdominal: Soft. Bowel sounds are normal. She exhibits no distension. There  is no tenderness. There is no rebound.  Musculoskeletal:       Left great toe appears black from the tip through the IP joint. Toe nail is removed. The proximal toe is erythematous, warm to the touch. Entire toe is very painful to the touch. No drainage noted. No tenderness at MTP joint. Normal foot.   Neurological: She is alert.  Skin: Skin is warm and dry.       Erythematous, maculopapular, raised rash to the proximal bilateral upper and lower extremities, abdomen, chest, lower back.    ED Course  Procedures (including critical care time)  Pt with left toe worsening pain, discoloration. Recent admission. Awaiting records. Labs penidng.   Results for orders placed during the hospital encounter of 11/12/12  CBC WITH DIFFERENTIAL      Component Value Range   WBC 9.6  4.0 - 10.5 K/uL   RBC 3.93  3.87 - 5.11 MIL/uL   Hemoglobin 12.0  12.0 - 15.0 g/dL   HCT 45.4 (*) 09.8 - 11.9 %   MCV 89.6  78.0 - 100.0 fL   MCH 30.5  26.0 - 34.0 pg   MCHC 34.1  30.0 - 36.0 g/dL   RDW 14.7 (*) 82.9 - 56.2 %   Platelets 328  150 - 400 K/uL   Neutrophils Relative 87 (*) 43 - 77 %   Neutro Abs 8.3 (*) 1.7 - 7.7 K/uL   Lymphocytes Relative 10 (*) 12 - 46 %   Lymphs Abs 1.0  0.7 - 4.0 K/uL   Monocytes Relative 3  3 - 12 %   Monocytes Absolute 0.3  0.1 - 1.0 K/uL   Eosinophils Relative 0  0 - 5 %   Eosinophils Absolute 0.0  0.0 - 0.7 K/uL   Basophils Relative 0  0 - 1 %   Basophils Absolute 0.0  0.0 - 0.1 K/uL  COMPREHENSIVE METABOLIC PANEL      Component Value Range   Sodium 133 (*) 135 - 145 mEq/L   Potassium 3.6  3.5 - 5.1 mEq/L   Chloride 96  96 - 112 mEq/L   CO2 21  19 - 32 mEq/L   Glucose, Bld 98  70 - 99 mg/dL   BUN 7  6 - 23 mg/dL   Creatinine, Ser 1.30  0.50 - 1.10 mg/dL   Calcium 8.7  8.4 - 86.5 mg/dL   Total Protein 8.4 (*) 6.0 - 8.3 g/dL   Albumin 3.0 (*) 3.5 - 5.2 g/dL   AST 784 (*) 0 - 37 U/L   ALT 58 (*) 0 - 35 U/L   Alkaline Phosphatase 180 (*) 39 - 117 U/L   Total Bilirubin  0.5  0.3 - 1.2 mg/dL   GFR calc non Af Amer >90  >  90 mL/min   GFR calc Af Amer >90  >90 mL/min  GLUCOSE, CAPILLARY      Component Value Range   Glucose-Capillary 91  70 - 99 mg/dL   Dg Humerus Left  7/82/9562  *RADIOLOGY REPORT*  Clinical Data: Fall with distal humerus pain.  LEFT HUMERUS - 2+ VIEW  Comparison: None.  Findings: No acute osseous abnormality.  Degenerative changes are seen in the left acromioclavicular joint.  Scattered soft tissue calcifications are noted.  IMPRESSION: No acute osseous abnormality.   Original Report Authenticated By: Leanna Battles, M.D.    Dg Toe Great Left  11/12/2012  *RADIOLOGY REPORT*  Clinical Data: Toe pain  LEFT GREAT TOE  Comparison: None.  Findings: Three views of the left great toe submitted.  No acute fracture or subluxation.  No periosteal reaction or bony erosion. Atherosclerotic vascular calcifications are noted.  IMPRESSION: No acute fracture or subluxation.   Original Report Authenticated By: Natasha Mead, M.D.       Records reviewd including an MRI and vascular ABI studies, as well as angiograph. Pt with decreased blood supply and pressure to the toe which most likely is the cause of  Her non healing ulceration. Vascular surgeon did not think any revascularization was possible during angiography and in his report suggested wound care and still a change of toe healing. Will speak with Dr. Lestine Box.    4:10 PM spoke with Dr. Shon Baton. Pt has an apt with Dr. Lestine Box at 10am tomorrow morning.    1. Nonhealing skin ulcer   2. Pain in toe   3. Rash       MDM  Pt with non healing left toe ischemic skin changes. Recent work up revealed poor vascularization as the cause. She does not appear to septic here. Doubt toe infected. No elevated WBC. X-ray negative. Spoke with Dr. Shon Baton, pt to follow up with Dr. Lestine Box tomorrow at 10 for further evaluation. She has pain medications at home, will continue. Rash I believe is allergic, continue benadryl.     Filed Vitals:   11/12/12 0944  BP: 129/86  Pulse: 78  Temp: 98.5 F (36.9 C)  Resp: [address] [place], [place] 11/12/12 2023

## 2012-11-12 NOTE — ED Notes (Addendum)
Pt comes in to the ER from her PCP office. Pt states that she has been having toe pain. Pt has been treated at various different facilities and recently had her toe nail removed at [facility]. Pt still complaining of pain and possible infection.

## 2012-11-12 NOTE — ED Notes (Signed)
Oxycodone prior to seeing her PCP today and has taken Toradol at the PCP office today.

## 2012-11-13 NOTE — ED Provider Notes (Signed)
Medical screening examination/treatment/procedure(s) were conducted as a shared visit with non-physician practitioner(s) and myself.  I personally evaluated the patient during the encounter On my exam this patient w MMP, recent removal of L great toe nail was in pain, but not distress.  The foot had appreciable proximal pulses, and good motor capacity.  The toe was not overtly infected, and with no fever or leukocytosis and a documented Hx of PVD, the pain / superficial changes are likely 2/2 poor flow.  Follow up was arranged with her orthopedist the next morning.  Gerhard Munch, MD 11/13/12 [phone]

## 2012-11-14 ENCOUNTER — Encounter (HOSPITAL_BASED_OUTPATIENT_CLINIC_OR_DEPARTMENT_OTHER): Payer: BC Managed Care – PPO

## 2012-11-16 ENCOUNTER — Encounter: Payer: Self-pay | Admitting: Cardiothoracic Surgery

## 2012-11-16 ENCOUNTER — Encounter: Payer: Self-pay | Admitting: Nurse Practitioner

## 2012-11-24 ENCOUNTER — Encounter: Payer: Self-pay | Admitting: Cardiothoracic Surgery

## 2012-11-24 ENCOUNTER — Encounter: Payer: Self-pay | Admitting: Nurse Practitioner

## 2012-12-20 ENCOUNTER — Other Ambulatory Visit: Payer: Self-pay | Admitting: *Deleted

## 2012-12-20 ENCOUNTER — Ambulatory Visit (INDEPENDENT_AMBULATORY_CARE_PROVIDER_SITE_OTHER): Payer: BC Managed Care – PPO | Admitting: Vascular Surgery

## 2012-12-20 ENCOUNTER — Encounter (HOSPITAL_COMMUNITY): Payer: Self-pay | Admitting: Pharmacy Technician

## 2012-12-20 ENCOUNTER — Encounter: Payer: Self-pay | Admitting: Vascular Surgery

## 2012-12-20 VITALS — BP 118/70 | HR 118 | Resp 20 | Ht 61.0 in | Wt 125.0 lb

## 2012-12-20 DIAGNOSIS — I739 Peripheral vascular disease, unspecified: Secondary | ICD-10-CM

## 2012-12-21 ENCOUNTER — Encounter (HOSPITAL_COMMUNITY): Payer: Self-pay | Admitting: *Deleted

## 2012-12-21 NOTE — Progress Notes (Signed)
[facility] HISTORY AND PHYSICAL   History of Present Illness:  Patient is a 52 y.o. year old female who presents for evaluation of gangrene left first toe.  The patient developed an infection in her left first toe after having a pedicure in September 2013. She had this debrided by podiatrist in October. However the toe began to get worse. It has now become gangrenous. She had a lower extremity arteriogram performed Dr. Fleming in [place] in January. This showed patent left lower extremity arterial tree by report. The report was reviewed today but the images were not available for review. She states that the toe is chronically painful. She takes Vicodin for the pain. She was referred to someone in [place] for amputation of the toe. However she decided to come to [place] for an additional opinion. The patient has a long-standing history of multiple rheumatologic disorders. These include Raynaud's, arthritis, polymyositis. She also has a history of diabetes. All these are currently controlled. Other medical problems include fibromyalgia.  Past Medical History  Diagnosis Date  . Osteoarthritis   . Polymyositis   . Diabetes mellitus   . Fibromyalgia   . Osteopenia   . Raynaud phenomenon   . Heart disease   . Polymyositis     Past Surgical History  Procedure Laterality Date  . Cesarean section      x3  . Tubal ligation    . Hysteroscopy      w/ D&C with excision of endometrial polyps  . Left great toenail removal  Left 2013    done by [place] [facility]  . Tonsillectomy Bilateral      Social History History  Substance Use Topics  . Smoking status: Never Smoker   . Smokeless tobacco: Never Used  . Alcohol Use: Yes     Comment: occasional use only    Family History Family History  Problem Relation Age of Onset  . Diabetes Mother   . Hypertension Mother   . Diabetes Father   . Heart disease Father   . Hypertension Sister   . Heart  disease Sister     Allergies  No Known Allergies   Current Outpatient Prescriptions  Medication Sig Dispense Refill  . alendronate (FOSAMAX) 70 MG tablet Take 70 mg by mouth every 7 (seven) days. Take with a full glass of water on an empty stomach. Takes on Monday.      . buPROPion (WELLBUTRIN XL) 150 MG 24 hr tablet Take 150 mg by mouth daily.      . methylPREDNISolone (MEDROL) 4 MG tablet Take 4 mg by mouth daily.      . mupirocin ointment (BACTROBAN) 2 % Apply 1 application topically daily.      . omeprazole (PRILOSEC) 40 MG capsule Take 40 mg by mouth every morning.       . oxyCODONE (OXY IR/ROXICODONE) 5 MG immediate release tablet Take 5-10 mg by mouth every 4 (four) hours as needed. For pain      . aspirin EC 325 MG tablet Take 325 mg by mouth daily.       No current facility-administered medications for this visit.    ROS:   General:  No weight loss, Fever, chills  HEENT: No recent headaches, no nasal bleeding, no visual changes, no sore throat  Neurologic: No dizziness, blackouts, seizures. No recent symptoms of stroke or mini- stroke. No recent episodes of slurred speech, or temporary blindness.  Cardiac: No recent episodes of chest pain/pressure,   no shortness of breath at rest.  No shortness of breath with exertion.  Denies history of atrial fibrillation or irregular heartbeat  Vascular: No history of rest pain in feet.  No history of claudication.  No history of non-healing ulcer, No history of DVT   Pulmonary: No home oxygen, no productive cough, no hemoptysis,  No asthma or wheezing  Musculoskeletal:  [x ] Arthritis, [ ] Low back pain,  [ ] Joint pain  Hematologic:No history of hypercoagulable state.  No history of easy bleeding.  No history of anemia  Gastrointestinal: No hematochezia or melena,  No gastroesophageal reflux, no trouble swallowing  Urinary: [ ] chronic Kidney disease, [ ] on HD - [ ] MWF or [ ] TTHS, [ ] Burning with urination, [ ] Frequent  urination, [ ] Difficulty urinating;   Skin: No rashes  Psychological: No history of anxiety,  No history of depression   Physical Examination  Filed Vitals:   12/20/12 1215  BP: 118/70  Pulse: 118  Resp: 20  Height: 5' 1" (1.549 m)  Weight: 125 lb (56.7 kg)  SpO2: 100%    Body mass index is 23.63 kg/(m^2).  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 I will review these on Monday. If she appears to have adequate perfusion to the left foot. We will proceed with left first toe amputation. Risks benefits possible complications and procedure details were explained the patient today. She understands and agrees to proceed.  Charles Fields, MD [facility] Office: [phone] Pager: [phone]  

## 2012-12-23 MED ORDER — DEXTROSE 5 % IV SOLN
1.5000 g | INTRAVENOUS | Status: AC
  Administered 2012-12-24: 1.5 g via INTRAVENOUS
  Filled 2012-12-23: qty 1.5

## 2012-12-24 ENCOUNTER — Inpatient Hospital Stay (HOSPITAL_COMMUNITY)
Admission: RE | Admit: 2012-12-24 | Discharge: 2012-12-26 | DRG: 114 | Disposition: A | Discharge status: Home / Self Care | Payer: BC Managed Care – PPO | Source: Ambulatory Visit | Attending: Vascular Surgery | Admitting: Vascular Surgery

## 2012-12-24 ENCOUNTER — Encounter (HOSPITAL_COMMUNITY): Payer: Self-pay | Admitting: *Deleted

## 2012-12-24 ENCOUNTER — Encounter (HOSPITAL_COMMUNITY): Payer: Self-pay | Admitting: Certified Registered"

## 2012-12-24 ENCOUNTER — Encounter (HOSPITAL_COMMUNITY)
Admission: RE | Disposition: A | Discharge status: Home / Self Care | Payer: Self-pay | Source: Ambulatory Visit | Attending: Vascular Surgery

## 2012-12-24 ENCOUNTER — Ambulatory Visit (HOSPITAL_COMMUNITY): Payer: BC Managed Care – PPO | Admitting: Certified Registered"

## 2012-12-24 DIAGNOSIS — Z7982 Long term (current) use of aspirin: Secondary | ICD-10-CM

## 2012-12-24 DIAGNOSIS — K5900 Constipation, unspecified: Secondary | ICD-10-CM | POA: Diagnosis present

## 2012-12-24 DIAGNOSIS — E119 Type 2 diabetes mellitus without complications: Secondary | ICD-10-CM | POA: Diagnosis present

## 2012-12-24 DIAGNOSIS — M3320 Polymyositis, organ involvement unspecified: Secondary | ICD-10-CM | POA: Diagnosis present

## 2012-12-24 DIAGNOSIS — I7300 Raynaud's syndrome without gangrene: Secondary | ICD-10-CM | POA: Diagnosis present

## 2012-12-24 DIAGNOSIS — M949 Disorder of cartilage, unspecified: Secondary | ICD-10-CM | POA: Diagnosis present

## 2012-12-24 DIAGNOSIS — M899 Disorder of bone, unspecified: Secondary | ICD-10-CM | POA: Diagnosis present

## 2012-12-24 DIAGNOSIS — M1990 Unspecified osteoarthritis, unspecified site: Secondary | ICD-10-CM | POA: Diagnosis present

## 2012-12-24 DIAGNOSIS — I739 Peripheral vascular disease, unspecified: Secondary | ICD-10-CM | POA: Diagnosis present

## 2012-12-24 DIAGNOSIS — Z79899 Other long term (current) drug therapy: Secondary | ICD-10-CM

## 2012-12-24 DIAGNOSIS — Z9089 Acquired absence of other organs: Secondary | ICD-10-CM

## 2012-12-24 DIAGNOSIS — IMO0001 Reserved for inherently not codable concepts without codable children: Secondary | ICD-10-CM | POA: Diagnosis present

## 2012-12-24 DIAGNOSIS — I70269 Atherosclerosis of native arteries of extremities with gangrene, unspecified extremity: Secondary | ICD-10-CM

## 2012-12-24 DIAGNOSIS — I519 Heart disease, unspecified: Secondary | ICD-10-CM | POA: Diagnosis present

## 2012-12-24 DIAGNOSIS — I96 Gangrene, not elsewhere classified: Principal | ICD-10-CM | POA: Diagnosis present

## 2012-12-24 HISTORY — DX: Constipation, unspecified: K59.00

## 2012-12-24 HISTORY — PX: AMPUTATION: SHX166

## 2012-12-24 HISTORY — DX: Unspecified convulsions: R56.9

## 2012-12-24 LAB — COMPREHENSIVE METABOLIC PANEL
ALT: 82 U/L — ABNORMAL HIGH (ref 0–35)
AST: 155 U/L — ABNORMAL HIGH (ref 0–37)
CO2: 26 mEq/L (ref 19–32)
Calcium: 8.4 mg/dL (ref 8.4–10.5)
Sodium: 139 mEq/L (ref 135–145)
Total Protein: 8.4 g/dL — ABNORMAL HIGH (ref 6.0–8.3)

## 2012-12-24 LAB — URINALYSIS, ROUTINE W REFLEX MICROSCOPIC
Bilirubin Urine: NEGATIVE
Specific Gravity, Urine: 1.024 (ref 1.005–1.030)
pH: 6 (ref 5.0–8.0)

## 2012-12-24 LAB — SURGICAL PCR SCREEN
MRSA, PCR: NEGATIVE
Staphylococcus aureus: NEGATIVE

## 2012-12-24 LAB — CBC
MCH: 28.6 pg (ref 26.0–34.0)
MCHC: 31.2 g/dL (ref 30.0–36.0)
Platelets: 245 10*3/uL (ref 150–400)
RBC: 3.77 MIL/uL — ABNORMAL LOW (ref 3.87–5.11)

## 2012-12-24 LAB — URINE MICROSCOPIC-ADD ON

## 2012-12-24 SURGERY — AMPUTATION DIGIT
Anesthesia: General | Site: Toe | Laterality: Left | Wound class: Contaminated

## 2012-12-24 MED ORDER — PHENOL 1.4 % MT LIQD: 1.0000 | OROMUCOSAL | Status: DC | PRN

## 2012-12-24 MED ORDER — MUPIROCIN 2 % EX OINT
1.0000 | TOPICAL_OINTMENT | Freq: Every day | CUTANEOUS | Status: DC
Start: 2012-12-24 — End: 2012-12-26
  Administered 2012-12-24 – 2012-12-25 (×2): 1 via TOPICAL
  Filled 2012-12-24: qty 22

## 2012-12-24 MED ORDER — METOPROLOL TARTRATE 1 MG/ML IV SOLN
2.0000 mg | INTRAVENOUS | Status: DC | PRN
  Filled 2012-12-24: qty 5

## 2012-12-24 MED ORDER — MEPERIDINE HCL 25 MG/ML IJ SOLN: 6.2500 mg | INTRAMUSCULAR | Status: DC | PRN

## 2012-12-24 MED ORDER — HYDROMORPHONE HCL PF 1 MG/ML IJ SOLN
0.2500 mg | INTRAMUSCULAR | Status: DC | PRN
  Administered 2012-12-24: 0.5 mg via INTRAVENOUS

## 2012-12-24 MED ORDER — ONDANSETRON HCL 4 MG/2ML IJ SOLN
INTRAMUSCULAR | Status: DC | PRN
  Administered 2012-12-24: 4 mg via INTRAVENOUS

## 2012-12-24 MED ORDER — PROPOFOL 10 MG/ML IV BOLUS
INTRAVENOUS | Status: DC | PRN
  Administered 2012-12-24: 140 mg via INTRAVENOUS
  Administered 2012-12-24: 60 mg via INTRAVENOUS

## 2012-12-24 MED ORDER — LABETALOL HCL 5 MG/ML IV SOLN
10.0000 mg | INTRAVENOUS | Status: DC | PRN
  Filled 2012-12-24: qty 4

## 2012-12-24 MED ORDER — POTASSIUM CHLORIDE CRYS ER 20 MEQ PO TBCR
20.0000 meq | EXTENDED_RELEASE_TABLET | Freq: Once | ORAL | Status: AC | PRN
  Filled 2012-12-24: qty 2

## 2012-12-24 MED ORDER — ENOXAPARIN SODIUM 30 MG/0.3ML ~~LOC~~ SOLN
40.0000 mg | SUBCUTANEOUS | Status: DC
  Administered 2012-12-25: 40 mg via SUBCUTANEOUS
  Filled 2012-12-24 (×2): qty 0.4

## 2012-12-24 MED ORDER — HYDRALAZINE HCL 20 MG/ML IJ SOLN
10.0000 mg | INTRAMUSCULAR | Status: DC | PRN
  Filled 2012-12-24: qty 0.5

## 2012-12-24 MED ORDER — ONDANSETRON HCL 4 MG/2ML IJ SOLN
4.0000 mg | Freq: Four times a day (QID) | INTRAMUSCULAR | Status: DC | PRN
  Administered 2012-12-25: 4 mg via INTRAVENOUS
  Filled 2012-12-24: qty 2

## 2012-12-24 MED ORDER — SENNOSIDES-DOCUSATE SODIUM 8.6-50 MG PO TABS: 1.0000 | ORAL_TABLET | Freq: Every evening | ORAL | Status: DC | PRN

## 2012-12-24 MED ORDER — OXYCODONE HCL 5 MG/5ML PO SOLN: 5.0000 mg | Freq: Once | ORAL | Status: DC | PRN

## 2012-12-24 MED ORDER — LIDOCAINE HCL (CARDIAC) 20 MG/ML IV SOLN
INTRAVENOUS | Status: DC | PRN
  Administered 2012-12-24: 100 mg via INTRAVENOUS

## 2012-12-24 MED ORDER — PANTOPRAZOLE SODIUM 40 MG PO TBEC
40.0000 mg | DELAYED_RELEASE_TABLET | Freq: Every day | ORAL | Status: DC
  Administered 2012-12-25 – 2012-12-26 (×2): 40 mg via ORAL
  Filled 2012-12-24 (×2): qty 1

## 2012-12-24 MED ORDER — GUAIFENESIN-DM 100-10 MG/5ML PO SYRP
15.0000 mL | ORAL_SOLUTION | ORAL | Status: DC | PRN
  Filled 2012-12-24: qty 15

## 2012-12-24 MED ORDER — ALUM & MAG HYDROXIDE-SIMETH 200-200-20 MG/5ML PO SUSP: 15.0000 mL | ORAL | Status: DC | PRN

## 2012-12-24 MED ORDER — OXYCODONE HCL 5 MG PO TABS: 5.0000 mg | ORAL_TABLET | Freq: Once | ORAL | Status: DC | PRN

## 2012-12-24 MED ORDER — PHENYLEPHRINE HCL 10 MG/ML IJ SOLN
INTRAMUSCULAR | Status: DC | PRN
  Administered 2012-12-24 (×2): 80 ug via INTRAVENOUS

## 2012-12-24 MED ORDER — 0.9 % SODIUM CHLORIDE (POUR BTL) OPTIME
TOPICAL | Status: DC | PRN
  Administered 2012-12-24: 1000 mL

## 2012-12-24 MED ORDER — FENTANYL CITRATE 0.05 MG/ML IJ SOLN
INTRAMUSCULAR | Status: DC | PRN
  Administered 2012-12-24 (×3): 25 ug via INTRAVENOUS
  Administered 2012-12-24: 50 ug via INTRAVENOUS
  Administered 2012-12-24: 100 ug via INTRAVENOUS
  Administered 2012-12-24: 25 ug via INTRAVENOUS

## 2012-12-24 MED ORDER — HYDROMORPHONE HCL PF 1 MG/ML IJ SOLN
INTRAMUSCULAR | Status: AC
  Administered 2012-12-24: 0.5 mg via INTRAVENOUS
  Filled 2012-12-24: qty 1

## 2012-12-24 MED ORDER — MORPHINE SULFATE 2 MG/ML IJ SOLN
2.0000 mg | INTRAMUSCULAR | Status: DC | PRN
  Administered 2012-12-24 (×2): 2 mg via INTRAVENOUS
  Administered 2012-12-25: 5 mg via INTRAVENOUS
  Administered 2012-12-25 (×2): 4 mg via INTRAVENOUS
  Administered 2012-12-25: 2 mg via INTRAVENOUS
  Administered 2012-12-25: 4 mg via INTRAVENOUS
  Administered 2012-12-25: 2 mg via INTRAVENOUS
  Administered 2012-12-25 – 2012-12-26 (×5): 4 mg via INTRAVENOUS
  Filled 2012-12-24 (×2): qty 2
  Filled 2012-12-24: qty 1
  Filled 2012-12-24 (×3): qty 2
  Filled 2012-12-24: qty 3
  Filled 2012-12-24: qty 2
  Filled 2012-12-24: qty 1
  Filled 2012-12-24: qty 2
  Filled 2012-12-24: qty 1
  Filled 2012-12-24: qty 2
  Filled 2012-12-24: qty 1

## 2012-12-24 MED ORDER — MUPIROCIN 2 % EX OINT
TOPICAL_OINTMENT | CUTANEOUS | Status: AC
  Administered 2012-12-24: 1 via NASAL
  Filled 2012-12-24: qty 22

## 2012-12-24 MED ORDER — ACETAMINOPHEN 325 MG PO TABS
325.0000 mg | ORAL_TABLET | ORAL | Status: DC | PRN
  Administered 2012-12-24: 650 mg via ORAL
  Filled 2012-12-24: qty 2

## 2012-12-24 MED ORDER — DOCUSATE SODIUM 100 MG PO CAPS
100.0000 mg | ORAL_CAPSULE | Freq: Every day | ORAL | Status: DC
  Administered 2012-12-25 – 2012-12-26 (×2): 100 mg via ORAL
  Filled 2012-12-24 (×2): qty 1

## 2012-12-24 MED ORDER — HYDROCORTISONE SOD SUCCINATE 100 MG IJ SOLR
INTRAMUSCULAR | Status: DC | PRN
  Administered 2012-12-24: 100 mg via INTRAVENOUS

## 2012-12-24 MED ORDER — DEXTROSE 5 % IV SOLN
1.5000 g | Freq: Two times a day (BID) | INTRAVENOUS | Status: AC
  Administered 2012-12-24 – 2012-12-25 (×2): 1.5 g via INTRAVENOUS
  Filled 2012-12-24 (×2): qty 1.5

## 2012-12-24 MED ORDER — BUPROPION HCL ER (XL) 150 MG PO TB24
150.0000 mg | ORAL_TABLET | Freq: Every day | ORAL | Status: DC
  Administered 2012-12-24 – 2012-12-26 (×3): 150 mg via ORAL
  Filled 2012-12-24 (×3): qty 1

## 2012-12-24 MED ORDER — LACTATED RINGERS IV SOLN
INTRAVENOUS | Status: DC | PRN
  Administered 2012-12-24: 07:00:00 via INTRAVENOUS

## 2012-12-24 MED ORDER — OXYCODONE HCL 5 MG PO TABS
5.0000 mg | ORAL_TABLET | ORAL | Status: DC | PRN
Start: 2012-12-24 — End: 2012-12-25
  Administered 2012-12-24 – 2012-12-25 (×4): 10 mg via ORAL
  Filled 2012-12-24 (×4): qty 2

## 2012-12-24 MED ORDER — SODIUM CHLORIDE 0.9 % IV SOLN: INTRAVENOUS | Status: DC

## 2012-12-24 MED ORDER — MIDAZOLAM HCL 5 MG/5ML IJ SOLN
INTRAMUSCULAR | Status: DC | PRN
  Administered 2012-12-24: 2 mg via INTRAVENOUS

## 2012-12-24 MED ORDER — ASPIRIN EC 325 MG PO TBEC
325.0000 mg | DELAYED_RELEASE_TABLET | Freq: Every day | ORAL | Status: DC
  Administered 2012-12-25 – 2012-12-26 (×2): 325 mg via ORAL
  Filled 2012-12-24 (×3): qty 1

## 2012-12-24 MED ORDER — ACETAMINOPHEN 650 MG RE SUPP: 325.0000 mg | RECTAL | Status: DC | PRN

## 2012-12-24 MED ORDER — ONDANSETRON HCL 4 MG/2ML IJ SOLN: 4.0000 mg | Freq: Once | INTRAMUSCULAR | Status: DC | PRN

## 2012-12-24 MED ORDER — METHYLPREDNISOLONE 4 MG PO TABS
4.0000 mg | ORAL_TABLET | Freq: Every day | ORAL | Status: DC
  Administered 2012-12-25 – 2012-12-26 (×2): 4 mg via ORAL
  Filled 2012-12-24 (×2): qty 1

## 2012-12-24 MED ORDER — SODIUM CHLORIDE 0.9 % IV SOLN
INTRAVENOUS | Status: DC
  Administered 2012-12-24 – 2012-12-25 (×2): via INTRAVENOUS

## 2012-12-24 SURGICAL SUPPLY — 33 items
BANDAGE ELASTIC 4 VELCRO ST LF (GAUZE/BANDAGES/DRESSINGS) ×2 IMPLANT
BANDAGE GAUZE ELAST BULKY 4 IN (GAUZE/BANDAGES/DRESSINGS) ×2 IMPLANT
CANISTER SUCTION 2500CC (MISCELLANEOUS) ×2 IMPLANT
CLOTH BEACON ORANGE TIMEOUT ST (SAFETY) ×2 IMPLANT
COVER SURGICAL LIGHT HANDLE (MISCELLANEOUS) ×2 IMPLANT
DRAPE EXTREMITY T 121X128X90 (DRAPE) ×2 IMPLANT
DRSG EMULSION OIL 3X3 NADH (GAUZE/BANDAGES/DRESSINGS) ×2 IMPLANT
ELECT REM PT RETURN 9FT ADLT (ELECTROSURGICAL) ×2
ELECTRODE REM PT RTRN 9FT ADLT (ELECTROSURGICAL) ×1 IMPLANT
GLOVE BIO SURGEON STRL SZ7.5 (GLOVE) ×2 IMPLANT
GLOVE BIOGEL PI IND STRL 6.5 (GLOVE) IMPLANT
GLOVE BIOGEL PI IND STRL 7.5 (GLOVE) IMPLANT
GLOVE BIOGEL PI INDICATOR 6.5 (GLOVE) ×1
GLOVE BIOGEL PI INDICATOR 7.5 (GLOVE) ×2
GLOVE SURG SS PI 7.5 STRL IVOR (GLOVE) ×2 IMPLANT
GOWN PREVENTION PLUS XLARGE (GOWN DISPOSABLE) ×4 IMPLANT
GOWN STRL NON-REIN LRG LVL3 (GOWN DISPOSABLE) ×2 IMPLANT
KIT BASIN OR (CUSTOM PROCEDURE TRAY) ×2 IMPLANT
KIT ROOM TURNOVER OR (KITS) ×2 IMPLANT
NS IRRIG 1000ML POUR BTL (IV SOLUTION) ×2 IMPLANT
PACK GENERAL/GYN (CUSTOM PROCEDURE TRAY) ×2 IMPLANT
PAD ARMBOARD 7.5X6 YLW CONV (MISCELLANEOUS) ×4 IMPLANT
SPECIMEN JAR SMALL (MISCELLANEOUS) ×2 IMPLANT
SPONGE GAUZE 4X4 12PLY (GAUZE/BANDAGES/DRESSINGS) ×2 IMPLANT
SUT ETHILON 3 0 PS 1 (SUTURE) ×3 IMPLANT
SUT VIC AB 3-0 SH 27 (SUTURE)
SUT VIC AB 3-0 SH 27X BRD (SUTURE) IMPLANT
SWAB COLLECTION DEVICE MRSA (MISCELLANEOUS) IMPLANT
TOWEL OR 17X24 6PK STRL BLUE (TOWEL DISPOSABLE) ×2 IMPLANT
TOWEL OR 17X26 10 PK STRL BLUE (TOWEL DISPOSABLE) ×2 IMPLANT
TUBE ANAEROBIC SPECIMEN COL (MISCELLANEOUS) IMPLANT
UNDERPAD 30X30 INCONTINENT (UNDERPADS AND DIAPERS) ×2 IMPLANT
WATER STERILE IRR 1000ML POUR (IV SOLUTION) ×2 IMPLANT

## 2012-12-24 NOTE — Interval H&P Note (Signed)
History and Physical Interval Note:  12/24/2012 7:21 AM  Allison Beasley  has presented today for surgery, with the diagnosis of GANGRENE TOE  The various methods of treatment have been discussed with the patient and family. After consideration of risks, benefits and other options for treatment, the patient has consented to  Procedure(s) with comments: AMPUTATION DIGIT (Left) - GREAT as a surgical intervention .  The patient's history has been reviewed, patient examined, no change in status, stable for surgery.  I have reviewed the patient's chart and labs.  Questions were answered to the patient's satisfaction.     Ronell Boldin E

## 2012-12-24 NOTE — Anesthesia Procedure Notes (Signed)
Procedure Name: LMA Insertion Date/Time: 12/24/2012 7:45 AM Performed by: Jerilee Hoh Pre-anesthesia Checklist: Patient identified, Emergency Drugs available, Suction available and Patient being monitored Patient Re-evaluated:Patient Re-evaluated prior to inductionOxygen Delivery Method: Circle system utilized Preoxygenation: Pre-oxygenation with 100% oxygen Intubation Type: IV induction LMA: LMA inserted LMA Size: 4.0 Tube type: Oral Number of attempts: 1 Airway Equipment and Method: Stylet Placement Confirmation: positive ETCO2 and breath sounds checked- equal and bilateral Tube secured with: Tape Dental Injury: Teeth and Oropharynx as per pre-operative assessment

## 2012-12-24 NOTE — H&P (View-Only) (Signed)
[facility] [place] HISTORY AND PHYSICAL   History of Present Illness:  Patient is a 52 y.o. year old female who presents for evaluation of gangrene left first toe.  The patient developed an infection in her left first toe after having a pedicure in September 2013. She had this debrided by podiatrist in October. However the toe began to get worse. It has now become gangrenous. She had a lower extremity arteriogram performed Dr. Meredeth Ide in [place] in January. This showed patent left lower extremity arterial tree by report. The report was reviewed today but the images were not available for review. She states that the toe is chronically painful. She takes Vicodin for the pain. She was referred to someone in [place] for amputation of the toe. However she decided to come to [facility] for an additional opinion. The patient has a long-standing history of multiple rheumatologic disorders. These include Raynaud's, arthritis, polymyositis. She also has a history of diabetes. All these are currently controlled. Other medical problems include fibromyalgia.  Past Medical History  Diagnosis Date  . Osteoarthritis   . Polymyositis   . Diabetes mellitus   . Fibromyalgia   . Osteopenia   . Raynaud phenomenon   . Heart disease   . Polymyositis     Past Surgical History  Procedure Laterality Date  . Cesarean section      x3  . Tubal ligation    . Hysteroscopy      w/ D&C with excision of endometrial polyps  . Left great toenail removal  Left 2013    done by [place] [facility]  . Tonsillectomy Bilateral      Social History History  Substance Use Topics  . Smoking status: Never Smoker   . Smokeless tobacco: Never Used  . Alcohol Use: Yes     Comment: occasional use only    Family History Family History  Problem Relation Age of Onset  . Diabetes Mother   . Hypertension Mother   . Diabetes Father   . Heart disease Father   . Hypertension Sister   . Heart  disease Sister     Allergies  No Known Allergies   Current Outpatient Prescriptions  Medication Sig Dispense Refill  . alendronate (FOSAMAX) 70 MG tablet Take 70 mg by mouth every 7 (seven) days. Take with a full glass of water on an empty stomach. Takes on Monday.      Marland Kitchen buPROPion (WELLBUTRIN XL) 150 MG 24 hr tablet Take 150 mg by mouth daily.      . methylPREDNISolone (MEDROL) 4 MG tablet Take 4 mg by mouth daily.      . mupirocin ointment (BACTROBAN) 2 % Apply 1 application topically daily.      Marland Kitchen omeprazole (PRILOSEC) 40 MG capsule Take 40 mg by mouth every morning.       Marland Kitchen oxyCODONE (OXY IR/ROXICODONE) 5 MG immediate release tablet Take 5-10 mg by mouth every 4 (four) hours as needed. For pain      . aspirin EC 325 MG tablet Take 325 mg by mouth daily.       No current facility-administered medications for this visit.    ROS:   General:  No weight loss, Fever, chills  HEENT: No recent headaches, no nasal bleeding, no visual changes, no sore throat  Neurologic: No dizziness, blackouts, seizures. No recent symptoms of stroke or mini- stroke. No recent episodes of slurred speech, or temporary blindness.  Cardiac: No recent episodes of chest pain/pressure,  no shortness of breath at rest.  No shortness of breath with exertion.  Denies history of atrial fibrillation or irregular heartbeat  Vascular: No history of rest pain in feet.  No history of claudication.  No history of non-healing ulcer, No history of DVT   Pulmonary: No home oxygen, no productive cough, no hemoptysis,  No asthma or wheezing  Musculoskeletal:  [x ] Arthritis, [ ]  Low back pain,  [ ]  Joint pain  Hematologic:No history of hypercoagulable state.  No history of easy bleeding.  No history of anemia  Gastrointestinal: No hematochezia or melena,  No gastroesophageal reflux, no trouble swallowing  Urinary: [ ]  chronic Kidney disease, [ ]  on HD - [ ]  MWF or [ ]  TTHS, [ ]  Burning with urination, [ ]  Frequent  urination, [ ]  Difficulty urinating;   Skin: No rashes  Psychological: No history of anxiety,  No history of depression   Physical Examination  Filed Vitals:   12/20/12 1215  BP: 118/70  Pulse: 118  Resp: 20  Height: 5\' 1"  (1.549 m)  Weight: 125 lb (56.7 kg)  SpO2: 100%    Body mass index is 23.63 kg/(m^2).  General:  Alert and oriented, no acute distress HEENT: Normal Neck: No bruit or JVD Pulmonary: Clear to auscultation bilaterally Cardiac: Regular Rate and Rhythm without murmur Abdomen: Soft, non-tender, non-distended, no mass, no scars Skin: No rash, dry gangrene left first toe extending all the way to the proximal phalanx Extremity Pulses:  2+ radial, brachial, femoral, dorsalis pedis, posterior tibial pulses bilaterally Musculoskeletal: No deformity or edema  Neurologic: Upper and lower extremity motor 5/5 and symmetric   ASSESSMENT: Dry gangrene left first toe with pain with no significant large vessel arterial occlusive disease   PLAN:  We will obtain the arteriogram images from [facility]. I will review these on Monday. If she appears to have adequate perfusion to the left foot. We will proceed with left first toe amputation. Risks benefits possible complications and procedure details were explained the patient today. She understands and agrees to proceed.  Fabienne Bruns, MD [facility] [place] Office: [phone] Pager: [phone]

## 2012-12-24 NOTE — Progress Notes (Signed)
Left lower extremity arteriogram from [facility] hospital reviewed.  Aorto iliac system patent, femoral popliteal system patent, all three tibial vessels patent but irregular.  Diffuse irregularity and blunting of vessels in left foot.  Small vessel disease but should have reasonable perfusion to heal first toe amputation.  Will proceed.  Fabienne Bruns, MD [facility] [place] Office: [phone] Pager: [phone]

## 2012-12-24 NOTE — Progress Notes (Signed)
Arrived to 6n07 from PACU, a/ox4, family at bedside, moved self from stretcher to bed without difficulty,denies nausea/pain at this time.

## 2012-12-24 NOTE — Progress Notes (Signed)
Orthopedic Tech Progress Note Patient Details:  Allison Beasley March 24, 1961 [id number]  Ortho Devices Type of Ortho Device: Postop shoe/boot Ortho Device/Splint Location: Darco shoe Ortho Device/Splint Interventions: Application   [name], [name] 12/24/2012, 11:39 AM

## 2012-12-24 NOTE — Preoperative (Signed)
Beta Blockers   Reason not to administer Beta Blockers:Not Applicable 

## 2012-12-24 NOTE — Anesthesia Preprocedure Evaluation (Addendum)
Anesthesia Evaluation  Patient identified by MRN, date of birth, ID band Patient awake    Reviewed: Allergy & Precautions, H&P , NPO status , Patient's Chart, lab work & pertinent test results, reviewed documented beta blocker date and time   Airway Mallampati: II TM Distance: >3 FB Neck ROM: Full    Dental  (+) Teeth Intact and Dental Advisory Given   Pulmonary          Cardiovascular + Peripheral Vascular Disease Rhythm:Regular     Neuro/Psych    GI/Hepatic   Endo/Other  neg diabetes  Renal/GU      Musculoskeletal  (+) Fibromyalgia -  Abdominal   Peds  Hematology   Anesthesia Other Findings   Reproductive/Obstetrics                           Anesthesia Physical Anesthesia Plan  ASA: III  Anesthesia Plan: General   Post-op Pain Management:    Induction: Intravenous  Airway Management Planned: Oral ETT  Additional Equipment:   Intra-op Plan:   Post-operative Plan: Extubation in OR  Informed Consent: I have reviewed the patients History and Physical, chart, labs and discussed the procedure including the risks, benefits and alternatives for the proposed anesthesia with the patient or authorized representative who has indicated his/her understanding and acceptance.     Plan Discussed with: CRNA and Surgeon  Anesthesia Plan Comments:        Anesthesia Quick Evaluation

## 2012-12-24 NOTE — Anesthesia Postprocedure Evaluation (Signed)
Anesthesia Post Note  Patient: Allison Beasley  Procedure(s) Performed: Procedure(s) (LRB): AMPUTATION DIGIT left great toe (Left)  Anesthesia type: general  Patient location: PACU  Post pain: Pain level controlled  Post assessment: Patient's Cardiovascular Status Stable  Last Vitals:  Filed Vitals:   12/24/12 0930  BP: 118/82  Pulse: 96  Temp:   Resp: 12    Post vital signs: Reviewed and stable  Level of consciousness: sedated  Complications: No apparent anesthesia complications

## 2012-12-24 NOTE — Transfer of Care (Signed)
Immediate Anesthesia Transfer of Care Note  Patient: Allison Beasley  Procedure(s) Performed: Procedure(s) with comments: AMPUTATION DIGIT left great toe (Left) - GREAT  Patient Location: PACU  Anesthesia Type:General  Level of Consciousness: awake, alert , oriented and patient cooperative  Airway & Oxygen Therapy: Patient Spontanous Breathing and Patient connected to nasal cannula oxygen  Post-op Assessment: Report given to PACU RN, Post -op Vital signs reviewed and stable and Patient moving all extremities  Post vital signs: Reviewed and stable  Complications: No apparent anesthesia complications

## 2012-12-24 NOTE — Op Note (Signed)
Procedure: Amputation left first toe with resection of metatarsal head  Preoperative diagnosis: Gangrene left first toe  Postoperative diagnosis: Same  Anesthesia: Gen.  Specimens: Left first toe  Assistant: Nurse  Operative details: After obtaining informed consent, the patient was taken to the operating room. The patient was placed in supine position on the operating room table. After induction of general anesthesia the patient's entire left foot was prepped and draped in usual sterile fashion. A circumferential incision was made at the base of the left first toe. The incision was carried all the way down to the level of the bone. There was some skin edge bleeding. This was controlled with cautery. The bone was transected with a bone cutter in the midportion of the proximal phalanx. The remainder of the proximal phalanx was debrided away with rongeurs. The metatarsal head was removed with rongeurs. The wound was thoroughly irrigated with normal saline solution. Hemostasis was obtained. Skin edges were reapproximated using interrupted 3-0 vertical mattress and simple nylon sutures. A dry sterile dressing was applied. The patient tolerated procedure well and there were no complications. Instrument sponge and needle counts were correct at the end of the case. The patient was taken to recovery in stable condition.   Fabienne Bruns, MD  [facility] [place]  Office: [phone]  Pager: [phone]

## 2012-12-24 NOTE — Evaluation (Signed)
Physical Therapy Evaluation Patient Details Name: Allison Beasley MRN: [id number] DOB: 14-Aug-1961 Today's Date: 12/24/2012 Time: 4401-0272 PT Time Calculation (min): 20 min  PT Assessment / Plan / Recommendation Clinical Impression  Pt. is a 52 y/o female s/p L great toe amputation.  Mobility limited by pain at this time.     PT Assessment  Patient needs continued PT services    Follow Up Recommendations  No PT follow up;Supervision/Assistance - 24 hour    Does the patient have the potential to tolerate intense rehabilitation      Barriers to Discharge None      Equipment Recommendations  None recommended by PT;Other (comment) (pt. has RW at home)    Recommendations for Other Services     Frequency Min 5X/week    Precautions / Restrictions Precautions Precautions: None Required Braces or Orthoses: Other Brace/Splint (L DARCO shoe) Other Brace/Splint: L DARCO shoe Restrictions Weight Bearing Restrictions: Yes LLE Weight Bearing: Touchdown weight bearing (through heel only)   Pertinent Vitals/Pain L foot 10/10 pain; RN notified and pain medication requested      Mobility  Bed Mobility Bed Mobility: Supine to Sit;Sit to Supine Supine to Sit: 6: Modified independent (Device/Increase time);With rails;HOB elevated Sit to Supine: 6: Modified independent (Device/Increase time);With rail;HOB elevated Transfers Transfers: Sit to Stand;Stand to Sit;Stand Pivot Transfers Sit to Stand: 4: Min guard;From bed;With upper extremity assist Stand to Sit: 4: Min guard;To bed;To chair/3-in-1;With upper extremity assist Stand Pivot Transfers: 4: Min guard;Other (comment) (with RW) Details for Transfer Assistance: Min cues for technique, pt. maintained NWB Ambulation/Gait Ambulation/Gait Assistance: 4: Min guard Ambulation Distance (Feet): 2 Feet Assistive device: Rolling walker Ambulation/Gait Assistance Details: pivitol steps to [facility] Gait Pattern: Step-to pattern Stairs: No Wheelchair  Mobility Wheelchair Mobility: No    Exercises     PT Diagnosis: Difficulty walking;Abnormality of gait;Acute pain  PT Problem List: Decreased activity tolerance;Decreased balance;Decreased mobility;Decreased knowledge of use of DME;Decreased knowledge of precautions;Pain PT Treatment Interventions: DME instruction;Gait training;Stair training;Functional mobility training;Therapeutic activities;Therapeutic exercise;Balance training;Neuromuscular re-education;Patient/family education   PT Goals Acute Rehab PT Goals PT Goal Formulation: With patient Time For Goal Achievement: 12/31/12 Potential to Achieve Goals: Good Pt will go Supine/Side to Sit: Independently;with HOB 0 degrees PT Goal: Supine/Side to Sit - Progress: Goal set today Pt will go Sit to Supine/Side: Independently;with HOB 0 degrees PT Goal: Sit to Supine/Side - Progress: Goal set today Pt will go Sit to Stand: with modified independence;with upper extremity assist PT Goal: Sit to Stand - Progress: Goal set today Pt will go Stand to Sit: with modified independence;with upper extremity assist PT Goal: Stand to Sit - Progress: Goal set today Pt will Ambulate: 51 - 150 feet;with modified independence;with rolling walker PT Goal: Ambulate - Progress: Goal set today Pt will Go Up / Down Stairs: 3-5 stairs;with rolling walker;with min assist PT Goal: Up/Down Stairs - Progress: Goal set today  Visit Information  Last PT Received On: 12/24/12 Assistance Needed: +1    Subjective Data  Subjective: "I'm supposed to stay off it." Patient Stated Goal: to decrease pain and return home   Prior Functioning  Home Living Lives With: Daughter Available Help at Discharge: Family;Available 24 hours/day (daughter) Type of Home: House Home Access: Stairs to enter [employer] of Steps: 5 Entrance Stairs-Rails: Left;Right Home Layout: One level;Able to live on main level with bedroom/bathroom;Full bath on main level Home  Adaptive Equipment: Walker - rolling Prior Function Level of Independence: Independent Able to Take Stairs?: Yes  Driving: Yes Communication Communication: No difficulties    Cognition  Cognition Overall Cognitive Status: Appears within functional limits for tasks assessed/performed Arousal/Alertness: Awake/alert Orientation Level: Appears intact for tasks assessed Behavior During Session: [facility] for tasks performed    Extremity/Trunk Assessment Right Lower Extremity Assessment RLE ROM/Strength/Tone: Within functional levels RLE Sensation: WFL - Light Touch RLE Coordination: WFL - gross/fine motor Left Lower Extremity Assessment LLE ROM/Strength/Tone: Due to precautions;Unable to fully assess Trunk Assessment Trunk Assessment: Normal   Balance Balance Balance Assessed: No  End of Session PT - End of Session Equipment Utilized During Treatment: Gait belt;Other (comment) (DARCO shoe) Activity Tolerance: Patient limited by pain Patient left: in bed;with call bell/phone within reach;with family/visitor present Nurse Communication: Mobility status;Patient requests pain meds;Weight bearing status  GP     Feltis, Nicki Reaper 12/24/2012, 3:48 PM  Nicki Reaper. Feltis, PT, DPT [phone]

## 2012-12-25 ENCOUNTER — Encounter (HOSPITAL_COMMUNITY): Payer: Self-pay | Admitting: Vascular Surgery

## 2012-12-25 ENCOUNTER — Telehealth: Payer: Self-pay | Admitting: Vascular Surgery

## 2012-12-25 LAB — BASIC METABOLIC PANEL
BUN: 11 mg/dL (ref 6–23)
Creatinine, Ser: 0.61 mg/dL (ref 0.50–1.10)
GFR calc Af Amer: >90 mL/min (ref >90)
GFR calc non Af Amer: >90 mL/min (ref >90)
Glucose, Bld: 91 mg/dL (ref 70–99)

## 2012-12-25 LAB — CBC
HCT: 31.1 % — ABNORMAL LOW (ref 36.0–46.0)
MCH: 28.9 pg (ref 26.0–34.0)
MCHC: 31.8 g/dL (ref 30.0–36.0)
RDW: 16.6 % — ABNORMAL HIGH (ref 11.5–15.5)

## 2012-12-25 MED ORDER — HYDROCODONE-ACETAMINOPHEN 5-325 MG PO TABS
1.0000 | ORAL_TABLET | Freq: Four times a day (QID) | ORAL | Status: DC | PRN
  Administered 2012-12-25 – 2012-12-26 (×5): 2 via ORAL
  Filled 2012-12-25 (×6): qty 2

## 2012-12-25 MED ORDER — OXYCODONE-ACETAMINOPHEN 5-325 MG PO TABS
1.0000 | ORAL_TABLET | ORAL | Status: DC | PRN
  Administered 2012-12-25: 2 via ORAL
  Filled 2012-12-25: qty 2

## 2012-12-25 MED ORDER — OXYCODONE-ACETAMINOPHEN 5-325 MG PO TABS: 1.0000 | ORAL_TABLET | ORAL | Status: DC | PRN

## 2012-12-25 MED ORDER — CYCLOBENZAPRINE HCL 10 MG PO TABS
5.0000 mg | ORAL_TABLET | Freq: Three times a day (TID) | ORAL | Status: DC | PRN
  Administered 2012-12-25: 5 mg via ORAL
  Filled 2012-12-25: qty 1

## 2012-12-25 MED ORDER — ENOXAPARIN SODIUM 40 MG/0.4ML ~~LOC~~ SOLN
40.0000 mg | SUBCUTANEOUS | Status: DC
  Administered 2012-12-26: 40 mg via SUBCUTANEOUS
  Filled 2012-12-25 (×2): qty 0.4

## 2012-12-25 NOTE — Progress Notes (Signed)
Pt awake and alert, a lot of pain in right foot last night  Filed Vitals:   12/24/12 1900 12/24/12 2120 12/25/12 0130 12/25/12 0523  BP: 118/69 123/82 124/67 117/66  Pulse: 90 85 93 97  Temp: 97.3 F (36.3 C) 98 F (36.7 C) 98.8 F (37.1 C) 98.6 F (37 C)  TempSrc: Oral   Oral  Resp: 16 16 16 18   Weight:      SpO2: 98% 100% 98% 96%   Right foot- dressing dry minimal swelling  Assessement: s/p right first toe amputation post op day one Plan: Try to ambulate today Will switch PO pain med to percocet Will need to stay another day if pain not controlled with PO pain meds Will change dressing tomorrow  Fabienne Bruns, MD [facility] [place] Office: [phone] Pager: [phone]

## 2012-12-25 NOTE — Telephone Encounter (Addendum)
Message copied by Shari Prows on Tue Dec 25, 2012  4:14 PM ------      Message from: Marlowe Shores      Created: Tue Dec 25, 2012 11:48 AM       4 weeks f/u toe amp- fields ------  I scheduled an appt for the above pt on 01/24/13 at 11am w. CE[facility]. I spoke to the pt regarding this appt/mailed an appt letter.awt

## 2012-12-25 NOTE — Progress Notes (Signed)
UR completed 

## 2012-12-25 NOTE — Progress Notes (Signed)
Physical Therapy Treatment Patient Details Name: Allison Beasley MRN: [id number] DOB: 26-Apr-1961 Today's Date: 12/25/2012 Time: 0215-0243 PT Time Calculation (min): 28 min  PT Assessment / Plan / Recommendation Comments on Treatment Session  Pt progressing with PT goals & mobility at this date.  Ambulated ~15' with RW.  Educated on TDWing but she performs with NWBing instead.  Pt reports sharp, shooting pain in Lt foot.   Pt will need to practice stairs before d/cing home (possibly tomorrow)    Follow Up Recommendations  No PT follow up;Supervision/Assistance - 24 hour     Does the patient have the potential to tolerate intense rehabilitation     Barriers to Discharge        Equipment Recommendations  None recommended by PT;Other (comment)    Recommendations for Other Services    Frequency Min 5X/week   Plan Discharge plan remains appropriate    Precautions / Restrictions Precautions Precautions: None Required Braces or Orthoses: Other Brace/Splint Other Brace/Splint: L DARCO shoe Restrictions Weight Bearing Restrictions: Yes LLE Weight Bearing: Touchdown weight bearing (through heel only)   Pertinent Vitals/Pain 6/10 Lt foot.      Mobility  Bed Mobility Bed Mobility: Supine to Sit;Sitting - Scoot to Edge of Bed;Sit to Supine Supine to Sit: 6: Modified independent (Device/Increase time);HOB flat Sitting - Scoot to Edge of Bed: 6: Modified independent (Device/Increase time) Sit to Supine: 6: Modified independent (Device/Increase time);HOB flat Transfers Transfers: Sit to Stand;Stand to Sit;Stand Pivot Transfers Sit to Stand: 4: Min guard;With upper extremity assist;From bed Stand to Sit: 4: Min guard;With upper extremity assist;With armrests;To bed;To chair/3-in-1 Stand Pivot Transfers: 4: Min guard Details for Transfer Assistance: Cues for hand placement & L LE positioning before sitting.  Guarding for safety.   Pt maintaining NWBing rather than PWBing.   Ambulation/Gait Ambulation/Gait Assistance: 4: Min guard Ambulation Distance (Feet): 15 Feet Assistive device: Rolling walker Ambulation/Gait Assistance Details: Cues for technique.  Pt maintaining NWBing Lt LE Gait Pattern: Step-to pattern General Gait Details: Pt has difficulty adjusting to use of darco shoe.  Maintains NWBing.   Stairs: No Wheelchair Mobility Wheelchair Mobility: No    Exercises General Exercises - Lower Extremity Ankle Circles/Pumps: AROM;Both;10 reps Gluteal Sets: AROM;10 reps Heel Slides: Both;10 reps Straight Leg Raises: Both;10 reps     PT Goals Acute Rehab PT Goals Time For Goal Achievement: 12/31/12 Potential to Achieve Goals: Good Pt will go Supine/Side to Sit: Independently;with HOB 0 degrees PT Goal: Supine/Side to Sit - Progress: Progressing toward goal Pt will go Sit to Supine/Side: Independently;with HOB 0 degrees PT Goal: Sit to Supine/Side - Progress: Progressing toward goal Pt will go Sit to Stand: with modified independence;with upper extremity assist PT Goal: Sit to Stand - Progress: Progressing toward goal Pt will go Stand to Sit: with modified independence;with upper extremity assist PT Goal: Stand to Sit - Progress: Progressing toward goal Pt will Ambulate: 51 - 150 feet;with modified independence;with rolling walker PT Goal: Ambulate - Progress: Progressing toward goal Pt will Go Up / Down Stairs: 3-5 stairs;with rolling walker;with min assist  Visit Information  Last PT Received On: 12/25/12 Assistance Needed: +1    Subjective Data      Cognition  Cognition Overall Cognitive Status: Appears within functional limits for tasks assessed/performed Arousal/Alertness: Awake/alert Orientation Level: Appears intact for tasks assessed Behavior During Session: [facility] for tasks performed    Balance  Balance Balance Assessed: No  End of Session PT - End of Session Equipment Utilized During  Treatment: Gait belt;Other (comment) (darco  shoe) Activity Tolerance: Patient tolerated treatment well;Patient limited by pain Patient left: in chair;with call bell/phone within reach     [facility], [place] [phone] 12/25/2012

## 2012-12-25 NOTE — Progress Notes (Signed)
RN called stating pt's pain is not well controlled.  Pt is requesting flexiril as she has taken this in the past.  Also states  She would rather have hydrocodone instead of percocet.  Flexiril ordered, percocet discontinued and vicodin 5/325 1-2 tabs every 6 hrs ordered.  Doreatha Massed 12/25/2012 3:50 PM

## 2012-12-26 LAB — CBC
HCT: 30 % — ABNORMAL LOW (ref 36.0–46.0)
Hemoglobin: 9.7 g/dL — ABNORMAL LOW (ref 12.0–15.0)
MCH: 29 pg (ref 26.0–34.0)
MCHC: 32.3 g/dL (ref 30.0–36.0)
MCV: 89.8 fL (ref 78.0–100.0)
Platelets: 240 10*3/uL (ref 150–400)
RBC: 3.34 MIL/uL — ABNORMAL LOW (ref 3.87–5.11)
RDW: 16.9 % — ABNORMAL HIGH (ref 11.5–15.5)
WBC: 5.3 10*3/uL (ref 4.0–10.5)

## 2012-12-26 LAB — BASIC METABOLIC PANEL
BUN: 7 mg/dL (ref 6–23)
Calcium: 8.1 mg/dL — ABNORMAL LOW (ref 8.4–10.5)
GFR calc Af Amer: >90 mL/min (ref >90)
GFR calc non Af Amer: >90 mL/min (ref >90)
Glucose, Bld: 82 mg/dL (ref 70–99)
Potassium: 3.8 mEq/L (ref 3.5–5.1)
Sodium: 141 mEq/L (ref 135–145)

## 2012-12-26 MED ORDER — CYCLOBENZAPRINE HCL 5 MG PO TABS: 5.0000 mg | ORAL_TABLET | Freq: Three times a day (TID) | ORAL | Status: DC | PRN

## 2012-12-26 MED ORDER — HYDROCODONE-ACETAMINOPHEN 5-325 MG PO TABS: 1.0000 | ORAL_TABLET | Freq: Four times a day (QID) | ORAL | Status: DC | PRN

## 2012-12-26 NOTE — Discharge Summary (Signed)
[facility] Discharge Summary   Patient ID:  Allison Beasley MRN: [id number] DOB/AGE: Apr 05, 1961 52 y.o.  Admit date: 12/24/2012 Discharge date: 12/26/2012 Date of Surgery: 12/24/2012 Surgeon: Surgeon(s): Sherren Kerns, MD  Admission Diagnosis: GANGRENE TOE  Discharge Diagnoses:  GANGRENE TOE  Secondary Diagnoses: Past Medical History  Diagnosis Date  . Osteoarthritis   . Polymyositis   . Fibromyalgia   . Osteopenia   . Raynaud phenomenon   . Polymyositis   . Constipation     Procedure(s): AMPUTATION DIGIT left great toe  Discharged Condition: good  HPI: Patient is a 52 y.o. year old female who presents for evaluation of gangrene left first toe. The patient developed an infection in her left first toe after having a pedicure in September 2013. She had this debrided by podiatrist in October. However the toe began to get worse. It has now become gangrenous. She had a lower extremity arteriogram performed Dr. Meredeth Ide in [place] in January. This showed patent left lower extremity arterial tree by report. The report was reviewed today but the images were not available for review. She states that the toe is chronically painful. She takes Vicodin for the pain. She was referred to someone in [place] for amputation of the toe. However she decided to come to [facility] for an additional opinion. The patient has a long-standing history of multiple rheumatologic disorders. These include Raynaud's, arthritis, polymyositis. She also has a history of diabetes. All these are currently controlled. Other medical problems include fibromyalgia    Hospital Course:  Allison Beasley is a 52 y.o. female is S/P Right Procedure(s): AMPUTATION DIGIT left great toe Extubated: POD # 0 Physical exam: wound healing well, no active bleeding no sign of infection. Post-op wounds healing well Pt. Ambulating, voiding and taking PO diet without difficulty. Pt pain controlled with PO  pain meds. Labs as below Complications:Pain management issues resolved with PO hydrocodone prior to leaving the hospital.  Consults:     Significant Diagnostic Studies: CBC Lab Results  Component Value Date   WBC 5.3 12/26/2012   HGB 9.7* 12/26/2012   HCT 30.0* 12/26/2012   MCV 89.8 12/26/2012   PLT 240 12/26/2012    BMET    Component Value Date/Time   NA 141 12/26/2012 0525   K 3.8 12/26/2012 0525   CL 106 12/26/2012 0525   CO2 28 12/26/2012 0525   GLUCOSE 82 12/26/2012 0525   BUN 7 12/26/2012 0525   CREATININE 0.61 12/26/2012 0525   CALCIUM 8.1* 12/26/2012 0525   GFRNONAA >90 12/26/2012 0525   GFRAA >90 12/26/2012 0525   COAG No results found for this basename: INR, PROTIME     Disposition:  Discharge to :Home Discharge Orders   Future Appointments Provider Department Dept Phone   01/24/2013 11:15 AM Sherren Kerns, MD [facility] [phone]   Future Orders Complete By Expires     Call MD for:  redness, tenderness, or signs of infection (pain, swelling, bleeding, redness, odor or green/yellow discharge around incision site)  As directed     Call MD for:  severe or increased pain, loss or decreased feeling  in affected limb(s)  As directed     Call MD for:  temperature >100.5  As directed     Change dressing (specify)  As directed     Comments:      Dressing change: daily as needed.    Discharge patient  As directed  Comments:      Discharge pt to home    Driving Restrictions  As directed     Comments:      No driving    Increase activity slowly  As directed     Comments:      Walk with assistance use walker or cane as needed    May shower   As directed     Scheduling Instructions:      Wednesday    Remove dressing in 24 hours  As directed     Resume previous diet  As directed         Medication List    STOP taking these medications       oxyCODONE 5 MG immediate release tablet  Commonly known as:  Oxy IR/ROXICODONE      TAKE these  medications       aspirin EC 325 MG tablet  Take 325 mg by mouth daily.     buPROPion 150 MG 24 hr tablet  Commonly known as:  WELLBUTRIN XL  Take 150 mg by mouth daily.     cyclobenzaprine 5 MG tablet  Commonly known as:  FLEXERIL  Take 1 tablet (5 mg total) by mouth 3 (three) times daily as needed for muscle spasms.     FOSAMAX 70 MG tablet  Generic drug:  alendronate  Take 70 mg by mouth every 7 (seven) days. Take with a full glass of water on an empty stomach. Takes on Monday.     HYDROcodone-acetaminophen 5-325 MG per tablet  Commonly known as:  NORCO/VICODIN  Take 1-2 tablets by mouth every 6 (six) hours as needed.     methylPREDNISolone 4 MG tablet  Commonly known as:  MEDROL  Take 4 mg by mouth daily.     mupirocin ointment 2 %  Commonly known as:  BACTROBAN  Apply 1 application topically daily.     omeprazole 40 MG capsule  Commonly known as:  PRILOSEC  Take 40 mg by mouth every morning.     oxyCODONE-acetaminophen 5-325 MG per tablet  Commonly known as:  PERCOCET/ROXICET  Take 1-2 tablets by mouth every 4 (four) hours as needed.       Verbal and written Discharge instructions given to the patient. Wound care per Discharge AVS     Follow-up Information   Follow up with Sherren Kerns, MD In 1 month. (office will arrange-sent)    Contact information:   [address] [place] [address] [phone]      Percocet D/C'd and given Norco 5/325 #30 no refills prior to D/C home   Signed: Clinton Gallant [facility] 12/26/2012, 1:24 PM

## 2012-12-26 NOTE — Progress Notes (Signed)
HHPT/OT arranged with [facility] per pt choice and insurance network options. Pt declined the tub bench as it would not be covered by [facility] and would be paid for out of pocket.  Address and phone number in EPIC are correct.

## 2012-12-26 NOTE — Progress Notes (Signed)
Physical Therapy Treatment Patient Details Name: Allison Beasley MRN: [id number] DOB: 03-15-1961 Today's Date: 12/26/2012 Time:  -     PT Assessment / Plan / Recommendation Comments on Treatment Session  Pt moves fairly well despite pain.  Able to perform stair training-- Required min guard.   Pt does not tolerate TDWing LLE therefore maintains NWBing with mobility.      Follow Up Recommendations  Home health PT;Supervision/Assistance - 24 hour     Does the patient have the potential to tolerate intense rehabilitation     Barriers to Discharge        Equipment Recommendations       Recommendations for Other Services    Frequency Min 5X/week   Plan Discharge plan remains appropriate    Precautions / Restrictions Precautions Precautions: None Required Braces or Orthoses: Other Brace/Splint Other Brace/Splint: L DARCO shoe Restrictions Weight Bearing Restrictions: Yes LLE Weight Bearing: Touchdown weight bearing   Pertinent Vitals/Pain 6/10 Lt foot.  Premedicated.      Mobility  Bed Mobility Bed Mobility: Not assessed Transfers Transfers: Sit to Stand;Stand to Sit Sit to Stand: 4: Min guard;With upper extremity assist;With armrests;From chair/3-in-1 Stand to Sit: 4: Min guard;With upper extremity assist;With armrests;To chair/3-in-1 Details for Transfer Assistance: Cues for safest hand placement;  Pt requires increased time to extend Lt LE due to pain-- keeps hip/knee flexed Ambulation/Gait Ambulation/Gait Assistance: 4: Min guard Ambulation Distance (Feet): 30 Feet Assistive device: Rolling walker Ambulation/Gait Assistance Details: Cues for technique.  Pt still unable to tolerate TWBing therefore maintains NWBing Gait Pattern: Step-to pattern Stairs: Yes Stairs Assistance: 4: Min guard Stairs Assistance Details (indicate cue type and reason): Guarding for safety.  Pt performed going up backwards with bil rails to allow more leverage with UE's to hop up step.   Stair  Management Technique: Two rails;Backwards;Step to pattern Number of Stairs: 3 (2x's) Wheelchair Mobility Wheelchair Mobility: No     PT Goals Acute Rehab PT Goals Time For Goal Achievement: 12/31/12 Potential to Achieve Goals: Good Pt will go Supine/Side to Sit: Independently;with HOB 0 degrees Pt will go Sit to Supine/Side: Independently;with HOB 0 degrees Pt will go Sit to Stand: with modified independence;with upper extremity assist PT Goal: Sit to Stand - Progress: Progressing toward goal Pt will go Stand to Sit: with modified independence;with upper extremity assist PT Goal: Stand to Sit - Progress: Progressing toward goal Pt will Ambulate: 51 - 150 feet;with modified independence;with rolling walker PT Goal: Ambulate - Progress: Progressing toward goal Pt will Go Up / Down Stairs: 3-5 stairs;with rolling walker;with min assist PT Goal: Up/Down Stairs - Progress: Progressing toward goal  Visit Information  Last PT Received On: 12/26/12 Assistance Needed: +1    Subjective Data      Cognition  Cognition Overall Cognitive Status: Appears within functional limits for tasks assessed/performed Arousal/Alertness: Awake/alert Orientation Level: Appears intact for tasks assessed Behavior During Session: [facility] for tasks performed Cognition - Other Comments: intact    Balance  Balance Balance Assessed: No  End of Session PT - End of Session Equipment Utilized During Treatment: Gait belt;Other (comment) (darco shoe) Activity Tolerance: Patient tolerated treatment well;Patient limited by pain Patient left: in chair;with call bell/phone within reach (OT present) Nurse Communication: Mobility status     Verdell Face, [place] [phone] 12/26/2012

## 2012-12-26 NOTE — Progress Notes (Signed)
Physical Therapy Treatment Patient Details Name: Allison Beasley MRN: [id number] DOB: Feb 28, 1961 Today's Date: 12/26/2012 Time:  -     PT Assessment / Plan / Recommendation Comments on Treatment Session  Pt moves fairly well despite pain.  Able to perform stair training-- Required min guard.   Pt does not tolerate TDWing LLE therefore maintains NWBing with mobility.      Follow Up Recommendations  Home health PT;Supervision/Assistance - 24 hour     Does the patient have the potential to tolerate intense rehabilitation     Barriers to Discharge        Equipment Recommendations       Recommendations for Other Services    Frequency Min 5X/week   Plan Discharge plan remains appropriate    Precautions / Restrictions Precautions Precautions: None Required Braces or Orthoses: Other Brace/Splint Other Brace/Splint: L DARCO shoe Restrictions Weight Bearing Restrictions: Yes LLE Weight Bearing: Touchdown weight bearing   Pertinent Vitals/Pain 6/10 Lt foot.  Premedicated.      Mobility  Bed Mobility Bed Mobility: Not assessed Transfers Transfers: Sit to Stand;Stand to Sit Sit to Stand: 4: Min guard;With upper extremity assist;With armrests;From chair/3-in-1 Stand to Sit: 4: Min guard;With upper extremity assist;With armrests;To chair/3-in-1 Details for Transfer Assistance: Cues for safest hand placement;  Pt requires increased time to extend Lt LE due to pain-- keeps hip/knee flexed Ambulation/Gait Ambulation/Gait Assistance: 4: Min guard Ambulation Distance (Feet): 30 Feet Assistive device: Rolling walker Ambulation/Gait Assistance Details: Cues for technique.  Pt still unable to tolerate TWBing therefore maintains NWBing Gait Pattern: Step-to pattern Stairs: Yes Stairs Assistance: 4: Min guard Stairs Assistance Details (indicate cue type and reason): Guarding for safety.  Pt performed going up backwards with bil rails to allow more leverage with UE's to hop up step.   Stair  Management Technique: Two rails;Backwards;Step to pattern Number of Stairs: 3 (2x's) Wheelchair Mobility Wheelchair Mobility: No     PT Goals Acute Rehab PT Goals Time For Goal Achievement: 12/31/12 Potential to Achieve Goals: Good Pt will go Supine/Side to Sit: Independently;with HOB 0 degrees Pt will go Sit to Supine/Side: Independently;with HOB 0 degrees Pt will go Sit to Stand: with modified independence;with upper extremity assist PT Goal: Sit to Stand - Progress: Progressing toward goal Pt will go Stand to Sit: with modified independence;with upper extremity assist PT Goal: Stand to Sit - Progress: Progressing toward goal Pt will Ambulate: 51 - 150 feet;with modified independence;with rolling walker PT Goal: Ambulate - Progress: Progressing toward goal Pt will Go Up / Down Stairs: 3-5 stairs;with rolling walker;with min assist PT Goal: Up/Down Stairs - Progress: Progressing toward goal  Visit Information  Last PT Received On: 12/26/12 Assistance Needed: +1    Subjective Data      Cognition  Cognition Overall Cognitive Status: Appears within functional limits for tasks assessed/performed Arousal/Alertness: Awake/alert Orientation Level: Appears intact for tasks assessed Behavior During Session: [facility] for tasks performed Cognition - Other Comments: intact    Balance  Balance Balance Assessed: No  End of Session PT - End of Session Equipment Utilized During Treatment: Gait belt;Other (comment) (darco shoe) Activity Tolerance: Patient tolerated treatment well;Patient limited by pain Patient left: in chair;with call bell/phone within reach (OT present) Nurse Communication: Mobility status     Verdell Face, [place] [phone] 12/26/2012  Agree with above assessment and recommendations.  Lewis Shock, PT, DPT Pager #: [phone] Office #: [phone]

## 2012-12-26 NOTE — Progress Notes (Signed)
[facility] [place]  Subjective  -  Right foot pain is better with hydrocodone.  Objective 109/66 89 99 F (37.2 C) (Oral) 18 100%  Intake/Output Summary (Last 24 hours) at 12/26/12 1321 Last data filed at 12/26/12 0900  Gross per 24 hour  Intake 1266.25 ml  Output      4 ml  Net 1262.25 ml    Dressing changed. Wound is clean and dry, no active bleeding. No sign of infection, no erythema or edema.  Assessment/Planning: POD #2  Weight bearing as tolerates in darco shoe, elevate when at rest. D/C home F/U in 4 weeks with Dr. Ivar Drape, EMMA [facility] 12/26/2012 1:21 PM --  Laboratory Lab Results:  Recent Labs  12/25/12 0455 12/26/12 0525  WBC 4.7 5.3  HGB 9.9* 9.7*  HCT 31.1* 30.0*  PLT 243 240   BMET  Recent Labs  12/25/12 0455 12/26/12 0525  NA 139 141  K 3.5 3.8  CL 104 106  CO2 26 28  GLUCOSE 91 82  BUN 11 7  CREATININE 0.61 0.61  CALCIUM 8.2* 8.1*    COAG No results found for this basename: INR, PROTIME   No results found for this basename: PTT

## 2012-12-26 NOTE — Evaluation (Signed)
Occupational Therapy Evaluation Patient Details Name: Allison Beasley MRN: [id number] DOB: Sep 12, 1961 Today's Date: 12/26/2012 Time: 0930-1000 OT Time Calculation (min): 30 min  OT Assessment / Plan / Recommendation Clinical Impression  Pt is a 52 yo female admitted for toe amputation whose greatest limitation is pain.  Pt with deficits listed below and would benefit from cont OT to reach Mod I level of care before returning home.    OT Assessment  Patient needs continued OT Services    Follow Up Recommendations  Home health OT;Other (comment);Supervision/Assistance - 24 hour (vs nothing if continues to progress.)    Barriers to Discharge None daughter and husband to assist at home.   Equipment Recommendations  Tub/shower bench    Recommendations for Other Services    Frequency  Min 2X/week    Precautions / Restrictions Precautions Precautions: None Required Braces or Orthoses: Other Brace/Splint Other Brace/Splint: L DARCO shoe Restrictions Weight Bearing Restrictions: Yes LLE Weight Bearing: Touchdown weight bearing   Pertinent Vitals/Pain Pt with 6/10 pain.    ADL  Eating/Feeding: Performed;Independent Where Assessed - Eating/Feeding: Chair Grooming: Performed;Wash/dry hands;Supervision/safety Where Assessed - Grooming: Supported standing Upper Body Bathing: Performed;Set up Where Assessed - Upper Body Bathing: Unsupported sitting Lower Body Bathing: Performed;Supervision/safety Where Assessed - Lower Body Bathing: Supported sit to stand Upper Body Dressing: Performed;Set up Where Assessed - Upper Body Dressing: Supported sitting Lower Body Dressing: Performed;Minimal assistance Where Assessed - Lower Body Dressing: Supported sit to stand Toilet Transfer: Research scientist (life sciences) Method: Sit to Barista: Comfort height toilet;Grab bars Toileting - Architect and Hygiene: Performed;Supervision/safety Where Assessed  - Engineer, mining and Hygiene: Sit on 3-in-1 or toilet Tub/Shower Transfer: Performed;Min guard Tub/Shower Transfer Method: Stand pivot;Other (comment) (w tub bench) Tub/Shower Transfer Equipment: Counsellor Used: Rolling walker Transfers/Ambulation Related to ADLs: Pt has difficult time standing at first due to pain.  After she stands for 1-2 minutes the pain improves and she can tolerate standing.  pt cannot tolerate putting weight through the heels at this point. ADL Comments: Pt has difficulty with LE dressing more due to pain. Pt with terrible pain in foot at this point.      OT Diagnosis: Generalized weakness;Acute pain  OT Problem List: Impaired balance (sitting and/or standing);Pain OT Treatment Interventions: Self-care/ADL training;Therapeutic activities   OT Goals Acute Rehab OT Goals OT Goal Formulation: With patient Time For Goal Achievement: 01/02/13 Potential to Achieve Goals: Good ADL Goals Pt Will Perform Lower Body Dressing: with modified independence;Sit to stand from chair ADL Goal: Lower Body Dressing - Progress: Goal set today Pt Will Perform Tub/Shower Transfer: with modified independence;Transfer tub bench ADL Goal: Tub/Shower Transfer - Progress: Goal set today Additional ADL Goal #1: Pt will complete all aspects of toileting on comfort height commode without rails with mod I. ADL Goal: Additional Goal #1 - Progress: Goal set today  Visit Information  Last OT Received On: 12/26/12 Assistance Needed: +1 PT/OT Co-Evaluation/Treatment: Yes    Subjective Data  Subjective: "It just hurts so badly when I first stand up.  I cannot put weigh on it at all yet." Patient Stated Goal: to go home today.   Prior Functioning     Home Living Lives With: Daughter Available Help at Discharge: Family;Available 24 hours/day Type of Home: House Home Access: Stairs to enter [employer] of Steps: 5 Entrance Stairs-Rails:  Left;Right Home Layout: One level;Able to live on main level with bedroom/bathroom;Full bath on main level Bathroom Shower/Tub:  Tub/shower unit;Curtain Teacher, early years/pre: Yes How Accessible: Accessible via walker Home Adaptive Equipment: Walker - rolling;Other (comment) (raised toilet seat w/o rails.) Additional Comments: needs tub bench Prior Function Level of Independence: Independent Able to Take Stairs?: Yes Driving: Yes Communication Communication: No difficulties Dominant Hand: Right         Vision/Perception     Cognition  Cognition Overall Cognitive Status: Appears within functional limits for tasks assessed/performed Arousal/Alertness: Awake/alert Orientation Level: Appears intact for tasks assessed Behavior During Session: [facility] for tasks performed Cognition - Other Comments: intact    Extremity/Trunk Assessment Right Upper Extremity Assessment RUE ROM/Strength/Tone: Within functional levels RUE Sensation: WFL - Light Touch RUE Coordination: WFL - gross/fine motor Left Upper Extremity Assessment LUE ROM/Strength/Tone: Within functional levels LUE Sensation: WFL - Light Touch LUE Coordination: WFL - gross/fine motor Trunk Assessment Trunk Assessment: Normal     Mobility Transfers Transfers: Sit to Stand;Stand to Sit Sit to Stand: 4: Min guard;With upper extremity assist;From bed Stand to Sit: 4: Min guard;With upper extremity assist;With armrests;To bed;To chair/3-in-1 Details for Transfer Assistance: Cues for hand placement & L LE positioning before sitting.  Guarding for safety.   Pt maintaining NWBing rather than PWBing.      Exercise     Balance Balance Balance Assessed: No   End of Session OT - End of Session Activity Tolerance: Patient tolerated treatment well Patient left: with nursing in room;Other (comment) (on commode) Nurse Communication: Mobility status  GO     Hope Budds 12/26/2012, 10:32  AM [phone]

## 2013-01-01 ENCOUNTER — Telehealth: Payer: Self-pay | Admitting: *Deleted

## 2013-01-01 NOTE — Telephone Encounter (Signed)
Called patient after receiving call from [facility] Physical Therapist from [facility],requesting a verbal order for skilled nurse visit to monitor medications and to check toe wound. Patient states that she is fine with her meds, she takes them all at one time. Her daughter will be back in town Saturday and she monitors this.  She states her toe incision site is good, no pain at all, no drainage or warmth. I instructed patient to call if any changes were noted

## 2013-01-03 ENCOUNTER — Telehealth: Payer: Self-pay | Admitting: *Deleted

## 2013-01-03 ENCOUNTER — Other Ambulatory Visit: Payer: Self-pay | Admitting: *Deleted

## 2013-01-03 NOTE — Telephone Encounter (Signed)
Patient called requesting refill on oxycodone. I denied request after [facility] pharmacy explained that 12/21/12 patient had received #60 from another [facility].

## 2013-01-13 ENCOUNTER — Inpatient Hospital Stay (HOSPITAL_COMMUNITY)
Admission: AD | Admit: 2013-01-13 | Discharge: 2013-01-17 | DRG: 099 | Disposition: A | Discharge status: Home / Self Care | Payer: BC Managed Care – PPO | Source: Other Acute Inpatient Hospital | Attending: Family Medicine | Admitting: Family Medicine

## 2013-01-13 ENCOUNTER — Encounter (HOSPITAL_COMMUNITY): Payer: Self-pay

## 2013-01-13 DIAGNOSIS — Z79899 Other long term (current) drug therapy: Secondary | ICD-10-CM

## 2013-01-13 DIAGNOSIS — R778 Other specified abnormalities of plasma proteins: Secondary | ICD-10-CM | POA: Diagnosis present

## 2013-01-13 DIAGNOSIS — M3320 Polymyositis, organ involvement unspecified: Secondary | ICD-10-CM

## 2013-01-13 DIAGNOSIS — R7401 Elevation of levels of liver transaminase levels: Secondary | ICD-10-CM | POA: Diagnosis present

## 2013-01-13 DIAGNOSIS — R7402 Elevation of levels of lactic acid dehydrogenase (LDH): Secondary | ICD-10-CM | POA: Diagnosis present

## 2013-01-13 DIAGNOSIS — R0989 Other specified symptoms and signs involving the circulatory and respiratory systems: Principal | ICD-10-CM | POA: Diagnosis present

## 2013-01-13 DIAGNOSIS — M899 Disorder of bone, unspecified: Secondary | ICD-10-CM | POA: Diagnosis present

## 2013-01-13 DIAGNOSIS — M949 Disorder of cartilage, unspecified: Secondary | ICD-10-CM | POA: Diagnosis present

## 2013-01-13 DIAGNOSIS — IMO0002 Reserved for concepts with insufficient information to code with codable children: Secondary | ICD-10-CM

## 2013-01-13 DIAGNOSIS — M1990 Unspecified osteoarthritis, unspecified site: Secondary | ICD-10-CM | POA: Diagnosis present

## 2013-01-13 DIAGNOSIS — R0609 Other forms of dyspnea: Principal | ICD-10-CM | POA: Diagnosis present

## 2013-01-13 DIAGNOSIS — M797 Fibromyalgia: Secondary | ICD-10-CM

## 2013-01-13 DIAGNOSIS — R7989 Other specified abnormal findings of blood chemistry: Secondary | ICD-10-CM

## 2013-01-13 DIAGNOSIS — Z9089 Acquired absence of other organs: Secondary | ICD-10-CM

## 2013-01-13 DIAGNOSIS — S98119A Complete traumatic amputation of unspecified great toe, initial encounter: Secondary | ICD-10-CM

## 2013-01-13 DIAGNOSIS — IMO0001 Reserved for inherently not codable concepts without codable children: Secondary | ICD-10-CM | POA: Diagnosis present

## 2013-01-13 DIAGNOSIS — F329 Major depressive disorder, single episode, unspecified: Secondary | ICD-10-CM | POA: Diagnosis present

## 2013-01-13 DIAGNOSIS — R0600 Dyspnea, unspecified: Secondary | ICD-10-CM | POA: Diagnosis present

## 2013-01-13 DIAGNOSIS — I7300 Raynaud's syndrome without gangrene: Secondary | ICD-10-CM

## 2013-01-13 DIAGNOSIS — F3289 Other specified depressive episodes: Secondary | ICD-10-CM | POA: Diagnosis present

## 2013-01-13 DIAGNOSIS — Z7982 Long term (current) use of aspirin: Secondary | ICD-10-CM

## 2013-01-13 DIAGNOSIS — R748 Abnormal levels of other serum enzymes: Secondary | ICD-10-CM | POA: Diagnosis present

## 2013-01-13 HISTORY — DX: Gastro-esophageal reflux disease without esophagitis: K21.9

## 2013-01-13 HISTORY — DX: Shortness of breath: R06.02

## 2013-01-13 HISTORY — DX: Headache: R51

## 2013-01-13 HISTORY — DX: Anemia, unspecified: D64.9

## 2013-01-13 LAB — MRSA PCR SCREENING: MRSA by PCR: NEGATIVE

## 2013-01-13 LAB — TROPONIN I: Troponin I: <0.3 ng/mL (ref <0.30)

## 2013-01-13 LAB — CBC WITH DIFFERENTIAL/PLATELET
Basophils Absolute: 0 10*3/uL (ref 0.0–0.1)
Basophils Relative: 0 % (ref 0–1)
Eosinophils Absolute: 0 10*3/uL (ref 0.0–0.7)
Eosinophils Relative: 0 % (ref 0–5)
HCT: 36.2 % (ref 36.0–46.0)
Hemoglobin: 11.9 g/dL — ABNORMAL LOW (ref 12.0–15.0)
Lymphocytes Relative: 16 % (ref 12–46)
Lymphs Abs: 1.2 10*3/uL (ref 0.7–4.0)
MCH: 29.4 pg (ref 26.0–34.0)
MCHC: 32.9 g/dL (ref 30.0–36.0)
MCV: 89.4 fL (ref 78.0–100.0)
Monocytes Absolute: 0.3 10*3/uL (ref 0.1–1.0)
Monocytes Relative: 4 % (ref 3–12)
Neutro Abs: 5.8 10*3/uL (ref 1.7–7.7)
Neutrophils Relative %: 80 % — ABNORMAL HIGH (ref 43–77)
Platelets: 233 10*3/uL (ref 150–400)
RBC: 4.05 MIL/uL (ref 3.87–5.11)
RDW: 16.1 % — ABNORMAL HIGH (ref 11.5–15.5)
WBC: 7.3 10*3/uL (ref 4.0–10.5)

## 2013-01-13 LAB — COMPREHENSIVE METABOLIC PANEL
ALT: 86 U/L — ABNORMAL HIGH (ref 0–35)
AST: 158 U/L — ABNORMAL HIGH (ref 0–37)
Albumin: 2.9 g/dL — ABNORMAL LOW (ref 3.5–5.2)
Alkaline Phosphatase: 94 U/L (ref 39–117)
BUN: 13 mg/dL (ref 6–23)
CO2: 24 mEq/L (ref 19–32)
Calcium: 8.8 mg/dL (ref 8.4–10.5)
Chloride: 103 mEq/L (ref 96–112)
Creatinine, Ser: 0.7 mg/dL (ref 0.50–1.10)
GFR calc Af Amer: >90 mL/min (ref >90)
GFR calc non Af Amer: >90 mL/min (ref >90)
Glucose, Bld: 86 mg/dL (ref 70–99)
Potassium: 4.5 mEq/L (ref 3.5–5.1)
Sodium: 139 mEq/L (ref 135–145)
Total Bilirubin: 0.3 mg/dL (ref 0.3–1.2)
Total Protein: 8.5 g/dL — ABNORMAL HIGH (ref 6.0–8.3)

## 2013-01-13 LAB — PRO B NATRIURETIC PEPTIDE: Pro B Natriuretic peptide (BNP): 44.6 pg/mL (ref 0–125)

## 2013-01-13 LAB — PROTIME-INR
INR: 1.02 (ref 0.00–1.49)
Prothrombin Time: 13.3 seconds (ref 11.6–15.2)

## 2013-01-13 LAB — SEDIMENTATION RATE: Sed Rate: 134 mm/hr — ABNORMAL HIGH (ref 0–22)

## 2013-01-13 LAB — CK TOTAL AND CKMB (NOT AT ARMC)
CK, MB: 26.8 ng/mL (ref 0.3–4.0)
Relative Index: 1.6 (ref 0.0–2.5)
Total CK: 1690 U/L — ABNORMAL HIGH (ref 7–177)

## 2013-01-13 LAB — MAGNESIUM: Magnesium: 2.3 mg/dL (ref 1.5–2.5)

## 2013-01-13 LAB — APTT: aPTT: 127 seconds — ABNORMAL HIGH (ref 24–37)

## 2013-01-13 MED ORDER — NITROGLYCERIN 0.4 MG SL SUBL: 0.4000 mg | SUBLINGUAL_TABLET | SUBLINGUAL | Status: DC | PRN

## 2013-01-13 MED ORDER — NEBIVOLOL HCL 5 MG PO TABS
5.0000 mg | ORAL_TABLET | Freq: Every day | ORAL | Status: DC
  Administered 2013-01-13 – 2013-01-14 (×2): 5 mg via ORAL
  Filled 2013-01-13 (×2): qty 1

## 2013-01-13 MED ORDER — FERROUS SULFATE 325 (65 FE) MG PO TBEC
325.0000 mg | DELAYED_RELEASE_TABLET | Freq: Every day | ORAL | Status: DC
  Administered 2013-01-14 – 2013-01-17 (×3): 325 mg via ORAL
  Filled 2013-01-13 (×5): qty 1

## 2013-01-13 MED ORDER — ASPIRIN EC 325 MG PO TBEC
325.0000 mg | DELAYED_RELEASE_TABLET | Freq: Every day | ORAL | Status: DC
  Administered 2013-01-13 – 2013-01-17 (×5): 325 mg via ORAL
  Filled 2013-01-13 (×6): qty 1

## 2013-01-13 MED ORDER — ALENDRONATE SODIUM 70 MG PO TABS: 70.0000 mg | ORAL_TABLET | ORAL | Status: DC

## 2013-01-13 MED ORDER — ACETAMINOPHEN 325 MG PO TABS
650.0000 mg | ORAL_TABLET | ORAL | Status: DC | PRN
  Filled 2013-01-13: qty 2

## 2013-01-13 MED ORDER — METOPROLOL TARTRATE 25 MG PO TABS: 25.0000 mg | ORAL_TABLET | Freq: Two times a day (BID) | ORAL | Status: DC

## 2013-01-13 MED ORDER — PANTOPRAZOLE SODIUM 40 MG PO TBEC
40.0000 mg | DELAYED_RELEASE_TABLET | Freq: Every day | ORAL | Status: DC
  Administered 2013-01-13 – 2013-01-17 (×5): 40 mg via ORAL
  Filled 2013-01-13 (×5): qty 1

## 2013-01-13 MED ORDER — HYDROCODONE-ACETAMINOPHEN 5-325 MG PO TABS
1.0000 | ORAL_TABLET | Freq: Four times a day (QID) | ORAL | Status: DC | PRN
  Administered 2013-01-13 (×2): 1 via ORAL
  Administered 2013-01-14 – 2013-01-17 (×8): 2 via ORAL
  Filled 2013-01-13 (×7): qty 2
  Filled 2013-01-13: qty 1
  Filled 2013-01-13: qty 2
  Filled 2013-01-13: qty 1

## 2013-01-13 MED ORDER — ZOLPIDEM TARTRATE 5 MG PO TABS: 5.0000 mg | ORAL_TABLET | Freq: Every evening | ORAL | Status: DC | PRN

## 2013-01-13 MED ORDER — ENOXAPARIN SODIUM 40 MG/0.4ML ~~LOC~~ SOLN
40.0000 mg | Freq: Every day | SUBCUTANEOUS | Status: DC
  Administered 2013-01-13 – 2013-01-16 (×4): 40 mg via SUBCUTANEOUS
  Filled 2013-01-13 (×6): qty 0.4

## 2013-01-13 MED ORDER — DOCUSATE SODIUM 100 MG PO CAPS
100.0000 mg | ORAL_CAPSULE | Freq: Every day | ORAL | Status: DC
  Administered 2013-01-13 – 2013-01-15 (×3): 100 mg via ORAL
  Filled 2013-01-13 (×4): qty 1

## 2013-01-13 MED ORDER — ALPRAZOLAM 0.25 MG PO TABS
0.2500 mg | ORAL_TABLET | Freq: Three times a day (TID) | ORAL | Status: DC | PRN
  Administered 2013-01-17: 0.25 mg via ORAL
  Filled 2013-01-13: qty 1

## 2013-01-13 MED ORDER — METHYLPREDNISOLONE 4 MG PO TABS
4.0000 mg | ORAL_TABLET | Freq: Every day | ORAL | Status: DC
  Administered 2013-01-14 – 2013-01-16 (×3): 4 mg via ORAL
  Filled 2013-01-13 (×4): qty 1

## 2013-01-13 MED ORDER — ALUM & MAG HYDROXIDE-SIMETH 200-200-20 MG/5ML PO SUSP
30.0000 mL | ORAL | Status: DC | PRN
Start: 2013-01-13 — End: 2013-01-17
  Administered 2013-01-13 – 2013-01-14 (×2): 30 mL via ORAL
  Filled 2013-01-13 (×2): qty 30

## 2013-01-13 MED ORDER — NIFEDIPINE ER 30 MG PO TB24
30.0000 mg | ORAL_TABLET | Freq: Every day | ORAL | Status: DC
  Administered 2013-01-13 – 2013-01-17 (×5): 30 mg via ORAL
  Filled 2013-01-13 (×5): qty 1

## 2013-01-13 MED ORDER — MUPIROCIN 2 % EX OINT: 1.0000 "application " | TOPICAL_OINTMENT | Freq: Two times a day (BID) | CUTANEOUS | Status: DC | PRN

## 2013-01-13 MED ORDER — ONDANSETRON HCL 4 MG/2ML IJ SOLN
4.0000 mg | Freq: Four times a day (QID) | INTRAMUSCULAR | Status: DC | PRN
  Administered 2013-01-15 – 2013-01-17 (×2): 4 mg via INTRAVENOUS
  Filled 2013-01-13 (×3): qty 2

## 2013-01-13 MED ORDER — BUPROPION HCL ER (XL) 150 MG PO TB24
150.0000 mg | ORAL_TABLET | Freq: Every day | ORAL | Status: DC
  Administered 2013-01-13 – 2013-01-17 (×5): 150 mg via ORAL
  Filled 2013-01-13 (×5): qty 1

## 2013-01-13 NOTE — Progress Notes (Signed)
PHARMACIST - PHYSICIAN COMMUNICATION  CONCERNING: P&T Medication Policy Regarding Oral Bisphosphonates  RECOMMENDATION: Your order for alendronate (Fosamax), ibandronate (Boniva), or risedronate (Actonel) has been discontinued at this time.  If the patient's post-hospital medical condition warrants safe use of this class of drugs, please resume the pre-hospital regimen upon discharge.  DESCRIPTION:  Alendronate (Fosamax), ibandronate (Boniva), and risedronate (Actonel) can cause severe esophageal erosions in patients who are unable to remain upright at least 30 minutes after taking this medication.   Since brief interruptions in therapy are thought to have minimal impact on bone mineral density, the Pharmacy & Therapeutics Committee has established that bisphosphonate orders should be routinely discontinued during hospitalization.   To override this safety policy and permit administration of Boniva, Fosamax, or Actonel in the hospital, prescribers must write "DO NOT HOLD" in the comments section when placing the order for this class of medications.    Lavonn Maxcy D. Laney Potash, PharmD, BCPS Pager:  [phone] 01/13/2013, 6:19 PM

## 2013-01-13 NOTE — H&P (Signed)
Patient ID: Allison Beasley MRN: [id number], DOB/AGE: January 17, 1961   Admit date: 01/13/2013   Primary Physician: Juline Patch, MD Primary Cardiologist: Dr Tresa Endo  HPI:  52 y/o with a history of Raynauds, polymyositis, and fibromyalgia. 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 Today she went to an Urgent Care because of sinus issues. She reported significant SOB when walking from her car to the Urgent Care. In the Urgent Care they problems getting an O2 sat (Raynauds). She was sent to [facility] ER.  CT at [facility] was negative. Her Troponin was slightly elevated at 0.2, Her CK was 1800 with an MB of 32.  There are no EKG changes.    Problem List: Past Medical History  Diagnosis Date  . Osteoarthritis   . Polymyositis   . Fibromyalgia   . Osteopenia   . Raynaud phenomenon   . Polymyositis   . Constipation     Past Surgical History  Procedure Laterality Date  . Cesarean section      x3  . Tubal ligation    . Hysteroscopy      w/ D&C with excision of endometrial polyps  . Left great toenail removal  Left 2013    done by [place] [facility]  . Tonsillectomy Bilateral   . Amputation Left 12/24/2012    Procedure: AMPUTATION DIGIT left great toe;  Surgeon: Sherren Kerns, MD;  Location: [facility] OR;  Service: Vascular;  Laterality: Left;  GREAT     Allergies: No Known Allergies   Home Medications Prescriptions prior to admission  Medication Sig Dispense Refill  . HYDROcodone-acetaminophen (NORCO/VICODIN) 5-325 MG per tablet Take 1-2 tablets by mouth every 6 (six) hours as needed.  30 tablet  0  . alendronate (FOSAMAX) 70 MG tablet Take 70 mg by mouth every 7 (seven) days. Take with a full glass of water on an empty stomach. Takes on Monday.      Marland Kitchen aspirin EC 325 MG tablet Take 325 mg by mouth daily.      Marland Kitchen buPROPion (WELLBUTRIN XL) 150 MG 24 hr tablet Take 150 mg by mouth daily.      . cyclobenzaprine  (FLEXERIL) 5 MG tablet Take 1 tablet (5 mg total) by mouth 3 (three) times daily as needed for muscle spasms.  30 tablet  0  . methylPREDNISolone (MEDROL) 4 MG tablet Take 4 mg by mouth daily.      . mupirocin ointment (BACTROBAN) 2 % Apply 1 application topically daily.      Marland Kitchen omeprazole (PRILOSEC) 40 MG capsule Take 40 mg by mouth every morning.       Marland Kitchen oxyCODONE-acetaminophen (PERCOCET/ROXICET) 5-325 MG per tablet Take 1-2 tablets by mouth every 4 (four) hours as needed.  30 tablet  0     Family History  Problem Relation Age of Onset  . Diabetes Mother   . Hypertension Mother   . Diabetes Father   . Heart disease Father   . Hypertension Sister   . Heart disease Sister      History   Social History  . Marital Status: Married    Spouse Name: N/A    Number of Children: N/A  . Years of Education: N/A   Occupational History  . Not on file.   Social History Main Topics  . Smoking status: Never Smoker   . Smokeless tobacco: Never Used  . Alcohol Use: No     Comment: occasional  use only  . Drug Use: No  . Sexually Active: Not on file   Other Topics Concern  . Not on file   Social History Narrative  . No narrative on file     Review of Systems: General: negative for chills, fever, night sweats or weight changes.  Dermatological: negative for rash Respiratory: negative for cough or wheezing Urologic: negative for hematuria Abdominal: negative for nausea, vomiting, diarrhea, bright red blood per rectum, melena, or hematemesis Neurologic: negative for visual changes, syncope, or dizziness All other systems reviewed and are otherwise negative except as noted above.  Physical Exam: Last menstrual period 06/20/2012.   General appearance: alert, cooperative and no distress [place]/AT Neck: no carotid bruit and no JVD Lungs: clear to auscultation bilaterally Heart: tachycardic at 105, 1/6 SEM Abdomen: soft, non-tender; bowel sounds normal; no masses,  no  organomegaly Extremities: L great toe amputation; Heberden's nodes Pulses: diminnished Skin: cool and dry Neurologic: Grossly normal    Labs:  No results found for this or any previous visit (from the past 24 hour(s)).   Radiology/Studies: No results found.  EKG:NST, ST, no acute changes  ASSESSMENT AND PLAN:  Principal Problem:   Dyspnea Active Problems:   Elevated troponin   Raynaud's disease   Polymyositis   Fibromyalgia  Plan- Stop Heparin, check follow up Troponin, check echo, add beta blocker.  Deland Pretty, PA-C 01/13/2013, 5:43 PM   Patient seen and examined. Agree with assessment and plan. Very pleasant 52 yo AAF who has a history polymyositis, Raynaud's, fibromyalgia who is admitted from [facility] ER after she presented today with shortness of breath. Trop 0.26 and CPK 1875 with MB 32. Relative index however, is negative at .017. By history patient also reports probable calcinosis and at times has had dry mouth and difficulty swallowing. ? Crest syndrome. She is s/p left great toe secondary to gangrene ? Digital ischemia in etiology. ECG reveals ST at 109. Doubt MI. Suspect increased CPK due to polymyositis; will check CPK, ESR, aldolase and urine for myoglobin. Check echo, TSH, T4. Will give sq lovenox for DVT prophylaxis. Ca channel blockade. Will add bystolic as beta blocker with nitric oxide vasodilation benefit.    Lennette Bihari, MD, [facility] 01/13/2013 5:56 PM

## 2013-01-14 LAB — TROPONIN I: Troponin I: <0.3 ng/mL (ref <0.30)

## 2013-01-14 LAB — TSH: TSH: 1.551 u[IU]/mL (ref 0.350–4.500)

## 2013-01-14 MED ORDER — NEBIVOLOL HCL 2.5 MG PO TABS
2.5000 mg | ORAL_TABLET | Freq: Every day | ORAL | Status: DC
  Administered 2013-01-15 – 2013-01-17 (×3): 2.5 mg via ORAL
  Filled 2013-01-14 (×3): qty 1

## 2013-01-14 MED FILL — Heparin Sodium (Porcine) 100 Unt/ML in Sodium Chloride 0.45%: INTRAMUSCULAR | Qty: 250 | Status: AC

## 2013-01-14 NOTE — Progress Notes (Signed)
[facility]  Subjective: Denies CP. Hasn't had further SOB, because she has been bed-bound.  Objective: Vital signs in last 24 hours: Temp:  [98.5 F (36.9 C)-99.3 F (37.4 C)] 99.3 F (37.4 C) (03/24 0725) Pulse Rate:  [88-108] 99 (03/24 0700) Resp:  [10-20] 15 (03/24 0700) BP: (101-135)/(66-89) 101/67 mmHg (03/24 0700) SpO2:  [97 %-100 %] 100 % (03/24 0700) Weight:  [114 lb 6.7 oz (51.9 kg)] 114 lb 6.7 oz (51.9 kg) (03/23 1735) Last BM Date: 01/11/13  Intake/Output from previous day: 03/23 0701 - 03/24 0700 In: 60 [P.O.:60] Out: 125 [Urine:125] Intake/Output this shift:    Medications Current Facility-Administered Medications  Medication Dose Route Frequency Provider Last Rate Last Dose  . acetaminophen (TYLENOL) tablet 650 mg  650 mg Oral Q4H PRN Abelino Derrick, PA-C      . ALPRAZolam Prudy Feeler) tablet 0.25 mg  0.25 mg Oral TID PRN Abelino Derrick, PA-C      . alum & mag hydroxide-simeth (MAALOX/MYLANTA) 200-200-20 MG/5ML suspension 30 mL  30 mL Oral PRN Lennette Bihari, MD   30 mL at 01/14/13 0753  . aspirin EC tablet 325 mg  325 mg Oral Daily Abelino Derrick, [place]   325 mg at 01/13/13 2110  . buPROPion (WELLBUTRIN XL) 24 hr tablet 150 mg  150 mg Oral Daily Abelino Derrick, PA-C   150 mg at 01/13/13 2109  . docusate sodium (COLACE) capsule 100 mg  100 mg Oral Daily Abelino Derrick, PA-C   100 mg at 01/13/13 2111  . enoxaparin (LOVENOX) injection 40 mg  40 mg Subcutaneous QHS Eda Paschal Kilroy, PA-C   40 mg at 01/13/13 2340  . ferrous sulfate EC tablet 325 mg  325 mg Oral Q breakfast Abelino Derrick, PA-C   325 mg at 01/14/13 0754  . HYDROcodone-acetaminophen (NORCO/VICODIN) 5-325 MG per tablet 1-2 tablet  1-2 tablet Oral Q6H PRN Abelino Derrick, PA-C   2 tablet at 01/14/13 0753  . methylPREDNISolone (MEDROL) tablet 4 mg  4 mg Oral Q breakfast Abelino Derrick, PA-C   4 mg at 01/14/13 0754  . mupirocin ointment (BACTROBAN) 2 % 1 application  1 application Topical BID  PRN Abelino Derrick, PA-C      . nebivolol (BYSTOLIC) tablet 5 mg  5 mg Oral Daily Eda Paschal [place], PA-C   5 mg at 01/13/13 2112  . NIFEdipine (PROCARDIA-XL/ADALAT CC) 24 hr tablet 30 mg  30 mg Oral Daily Abelino Derrick, PA-C   30 mg at 01/13/13 2111  . nitroGLYCERIN (NITROSTAT) SL tablet 0.4 mg  0.4 mg Sublingual Q5 Min x 3 PRN Luke K Kilroy, PA-C      . ondansetron [facility]) injection 4 mg  4 mg Intravenous Q6H PRN Abelino Derrick, PA-C      . pantoprazole (PROTONIX) EC tablet 40 mg  40 mg Oral Q1200 Abelino Derrick, PA-C   40 mg at 01/13/13 2114  . zolpidem (AMBIEN) tablet 5 mg  5 mg Oral QHS PRN Abelino Derrick, PA-C        PE: General appearance: alert, cooperative and no distress Lungs: clear to auscultation bilaterally Heart: regular rate and rhythm Extremities: no LEE Pulses: 2+ radials, 1+ DP on the left. 2+ DP on the right Skin: warm and dry Neurologic: Grossly normal  Lab Results:   Recent Labs  01/13/13 1820  WBC 7.3  HGB 11.9*  HCT 36.2  PLT 233  BMET  Recent Labs  01/13/13 1820  NA 139  K 4.5  CL 103  CO2 24  GLUCOSE 86  BUN 13  CREATININE 0.70  CALCIUM 8.8   PT/INR  Recent Labs  01/13/13 1820  LABPROT 13.3  INR 1.02    Cardiac Enzymes Cardiac Panel (last 3 results)  Recent Labs  01/13/13 1820 01/14/13 0525  CKTOTAL 1690*  --   CKMB 26.8*  --   TROPONINI <0.30 <0.30  RELINDX 1.6  --     Assessment/Plan  Principal Problem:   Dyspnea Active Problems:   Elevated troponin   Raynaud's disease   Polymyositis   Fibromyalgia  Plan: No further SOB. Denies CP. Troponin's negative x 2. BNP yesterday was 44.6. Symptoms are unlikely cardiac related. Likely due to polymyositis.  ESR elevated at 134. Myoglobin and aldolase are pending. TSH was WNL. Bystolic was added yesterday. HR and BP both stable. Plan for 2D echo today. Will continue to monitor.     LOS: 1 day    Allison Beasley 01/14/2013 9:05 AM  I have seen and evaluated the  patient this AM along with Boyce Medici, PA. I agree with her findings, examination as well as impression recommendations.  With lack of elevated proBNP & Troponin, the Dx of coronary ischemia is highly unlikely.   BP is much lower on Bystolic + Nifidipine.   Will need to assess Sx with OOB & ambulation.   Echo pending along with Rhematalogic work-up.  If Echo does not reveal CHF, will ask to transfer to [facility] Med service.  Can transfer out to tele.  Marykay Lex, M.D., M.S. [facility] [address] [place], [place]  [address]  [phone] Pager # [phone] 01/14/2013 9:53 AM

## 2013-01-14 NOTE — Progress Notes (Signed)
Echocardiogram 2D Echocardiogram has been performed.  Lukah Goswami 01/14/2013, 12:31 PM

## 2013-01-15 LAB — ALDOLASE: Aldolase: 15.6 U/L — ABNORMAL HIGH (ref <=8.1)

## 2013-01-15 NOTE — Progress Notes (Signed)
[facility]  Patient Status: Active (receiving services up to time of hospitalization)  [facility] is providing the following services: PT.  Home physical therapist is asking for RN to be added to home care orders for med management. Thanks.  If patient discharges after hours, please call [phone].   Jodene Nam 01/15/2013, 5:58 PM

## 2013-01-15 NOTE — Progress Notes (Signed)
[facility]  DAILY PROGRESS NOTE   Subjective:  Weak, dyspneic with light exertion, sore spot in back. Low grade fever. Echo shows normal systolic and diastolic function and no significant valvulopathy.  Objective:  Temp:  [97.2 F (36.2 C)-100.3 F (37.9 C)] 100.3 F (37.9 C) (03/25 0730) Pulse Rate:  [94-102] 102 (03/25 0730) Resp:  [14-20] 20 (03/25 0730) BP: (92-128)/(56-88) 98/65 mmHg (03/25 0730) SpO2:  [97 %-99 %] 99 % (03/25 0730) Weight change:   Intake/Output from previous day: 03/24 0701 - 03/25 0700 In: 720 [P.O.:720] Out: 750 [Urine:750]  Intake/Output from this shift:    Medications: Current Facility-Administered Medications  Medication Dose Route Frequency Provider Last Rate Last Dose  . acetaminophen (TYLENOL) tablet 650 mg  650 mg Oral Q4H PRN Abelino Derrick, PA-C      . ALPRAZolam Prudy Feeler) tablet 0.25 mg  0.25 mg Oral TID PRN Abelino Derrick, PA-C      . alum & mag hydroxide-simeth (MAALOX/MYLANTA) 200-200-20 MG/5ML suspension 30 mL  30 mL Oral PRN Lennette Bihari, MD   30 mL at 01/14/13 0753  . aspirin EC tablet 325 mg  325 mg Oral Daily Abelino Derrick, PA-C   325 mg at 01/14/13 0900  . buPROPion (WELLBUTRIN XL) 24 hr tablet 150 mg  150 mg Oral Daily Abelino Derrick, PA-C   150 mg at 01/14/13 1013  . docusate sodium (COLACE) capsule 100 mg  100 mg Oral Daily Abelino Derrick, PA-C   100 mg at 01/14/13 1014  . enoxaparin (LOVENOX) injection 40 mg  40 mg Subcutaneous QHS Abelino Derrick, PA-C   40 mg at 01/14/13 2155  . ferrous sulfate EC tablet 325 mg  325 mg Oral Q breakfast Abelino Derrick, PA-C   325 mg at 01/14/13 0754  . HYDROcodone-acetaminophen (NORCO/VICODIN) 5-325 MG per tablet 1-2 tablet  1-2 tablet Oral Q6H PRN Abelino Derrick, PA-C   2 tablet at 01/14/13 2005  . methylPREDNISolone (MEDROL) tablet 4 mg  4 mg Oral Q breakfast Abelino Derrick, PA-C   4 mg at 01/14/13 0754  . mupirocin ointment (BACTROBAN) 2 % 1 application  1 application  Topical BID PRN Abelino Derrick, PA-C      . nebivolol (BYSTOLIC) tablet 2.5 mg  2.5 mg Oral Daily Marykay Lex, MD      . NIFEdipine (PROCARDIA-XL/ADALAT CC) 24 hr tablet 30 mg  30 mg Oral Daily Abelino Derrick, PA-C   30 mg at 01/14/13 1013  . nitroGLYCERIN (NITROSTAT) SL tablet 0.4 mg  0.4 mg Sublingual Q5 Min x 3 PRN Abelino Derrick, PA-C      . ondansetron [facility]) injection 4 mg  4 mg Intravenous Q6H PRN Abelino Derrick, PA-C   4 mg at 01/15/13 0606  . pantoprazole (PROTONIX) EC tablet 40 mg  40 mg Oral Q1200 Eda Paschal Kilroy, PA-C   40 mg at 01/14/13 1014  . zolpidem (AMBIEN) tablet 5 mg  5 mg Oral QHS PRN Abelino Derrick, PA-C        Physical Exam: General appearance: alert, appears stated age, mild distress and a little melodramatic Neck: no adenopathy, no carotid bruit, no JVD, supple, symmetrical, trachea midline and thyroid not enlarged, symmetric, no tenderness/mass/nodules Lungs: clear to auscultation bilaterally Heart: regular rate and rhythm, S1, S2 normal, no murmur, click, rub or gallop Abdomen: soft, non-tender; bowel sounds normal; no masses,  no organomegaly Extremities: extremities normal, atraumatic,  no cyanosis or edema Pulses: 2+ and symmetric Skin: Skin color, texture, turgor normal. No rashes or lesions Neurologic: Grossly normal Proximal muscle weakness noted.  Lab Results: Results for orders placed during the hospital encounter of 01/13/13 (from the past 48 hour(s))  MRSA PCR SCREENING     Status: None   Collection Time    01/13/13  5:42 PM      Result Value Range   MRSA by PCR NEGATIVE  NEGATIVE   Comment:            The GeneXpert MRSA Assay (FDA     approved for NASAL specimens     only), is one component of a     comprehensive MRSA colonization     surveillance program. It is not     intended to diagnose MRSA     infection nor to guide or     monitor treatment for     MRSA infections.  TROPONIN I     Status: None   Collection Time    01/13/13  6:20 PM       Result Value Range   Troponin I <0.30  <0.30 ng/mL   Comment:            Due to the release kinetics of cTnI,     a negative result within the first hours     of the onset of symptoms does not rule out     myocardial infarction with certainty.     If myocardial infarction is still suspected,     repeat the test at appropriate intervals.  PROTIME-INR     Status: None   Collection Time    01/13/13  6:20 PM      Result Value Range   Prothrombin Time 13.3  11.6 - 15.2 seconds   INR 1.02  0.00 - 1.49  APTT     Status: Abnormal   Collection Time    01/13/13  6:20 PM      Result Value Range   aPTT 127 (*) 24 - 37 seconds   Comment:            IF BASELINE aPTT IS ELEVATED,     SUGGEST PATIENT RISK ASSESSMENT     BE USED TO DETERMINE APPROPRIATE     ANTICOAGULANT THERAPY.  CBC WITH DIFFERENTIAL     Status: Abnormal   Collection Time    01/13/13  6:20 PM      Result Value Range   WBC 7.3  4.0 - 10.5 K/uL   RBC 4.05  3.87 - 5.11 MIL/uL   Hemoglobin 11.9 (*) 12.0 - 15.0 g/dL   HCT 47.8  29.5 - 62.1 %   MCV 89.4  78.0 - 100.0 fL   MCH 29.4  26.0 - 34.0 pg   MCHC 32.9  30.0 - 36.0 g/dL   RDW 30.8 (*) 65.7 - 84.6 %   Platelets 233  150 - 400 K/uL   Neutrophils Relative 80 (*) 43 - 77 %   Neutro Abs 5.8  1.7 - 7.7 K/uL   Lymphocytes Relative 16  12 - 46 %   Lymphs Abs 1.2  0.7 - 4.0 K/uL   Monocytes Relative 4  3 - 12 %   Monocytes Absolute 0.3  0.1 - 1.0 K/uL   Eosinophils Relative 0  0 - 5 %   Eosinophils Absolute 0.0  0.0 - 0.7 K/uL   Basophils Relative 0  0 - 1 %   Basophils Absolute  0.0  0.0 - 0.1 K/uL  TSH     Status: None   Collection Time    01/13/13  6:20 PM      Result Value Range   TSH 1.551  0.350 - 4.500 uIU/mL  COMPREHENSIVE METABOLIC PANEL     Status: Abnormal   Collection Time    01/13/13  6:20 PM      Result Value Range   Sodium 139  135 - 145 mEq/L   Potassium 4.5  3.5 - 5.1 mEq/L   Chloride 103  96 - 112 mEq/L   CO2 24  19 - 32 mEq/L   Glucose, Bld 86   70 - 99 mg/dL   BUN 13  6 - 23 mg/dL   Creatinine, Ser 1.02  0.50 - 1.10 mg/dL   Calcium 8.8  8.4 - 72.5 mg/dL   Total Protein 8.5 (*) 6.0 - 8.3 g/dL   Albumin 2.9 (*) 3.5 - 5.2 g/dL   AST 366 (*) 0 - 37 U/L   ALT 86 (*) 0 - 35 U/L   Alkaline Phosphatase 94  39 - 117 U/L   Total Bilirubin 0.3  0.3 - 1.2 mg/dL   GFR calc non Af Amer >90  >90 mL/min   GFR calc Af Amer >90  >90 mL/min   Comment:            The eGFR has been calculated     using the CKD EPI equation.     This calculation has not been     validated in all clinical     situations.     eGFR's persistently     <90 mL/min signify     possible Chronic Kidney Disease.  MAGNESIUM     Status: None   Collection Time    01/13/13  6:20 PM      Result Value Range   Magnesium 2.3  1.5 - 2.5 mg/dL  PRO B NATRIURETIC PEPTIDE     Status: None   Collection Time    01/13/13  6:20 PM      Result Value Range   Pro B Natriuretic peptide (BNP) 44.6  0 - 125 pg/mL  SEDIMENTATION RATE     Status: Abnormal   Collection Time    01/13/13  6:20 PM      Result Value Range   Sed Rate 134 (*) 0 - 22 mm/hr  CK TOTAL AND CKMB     Status: Abnormal   Collection Time    01/13/13  6:20 PM      Result Value Range   Total CK 1690 (*) 7 - 177 U/L   CK, MB 26.8 (*) 0.3 - 4.0 ng/mL   Comment: CRITICAL RESULT CALLED TO, READ BACK BY AND VERIFIED WITH:     ROBERTS,J RN 01/13/13 1938 WOOTEN,K   Relative Index 1.6  0.0 - 2.5  TROPONIN I     Status: None   Collection Time    01/14/13  5:25 AM      Result Value Range   Troponin I <0.30  <0.30 ng/mL   Comment:            Due to the release kinetics of cTnI,     a negative result within the first hours     of the onset of symptoms does not rule out     myocardial infarction with certainty.     If myocardial infarction is still suspected,     repeat the test at  appropriate intervals.    Imaging: No results found.  Assessment:  1. Principal Problem: 2.   Dyspnea 3. Active Problems: 4.    Elevated troponin borderline -- no true elevation, resolved. 5.   Raynaud's disease 6.   Polymyositis 7.   Fibromyalgia 8.   Plan:  1. No evidence of CHF by clinical exam, CXR/chest CT, echo or biomarkers. Likewise no true evidence of coronary insufficiency by ECG, echo or biomarkers and free of angina. Elevated CK is musculoskeletal in origin.  2. She does have low grade fever, generalized weakness, mild sinus tachycardia and an elevated ESR suggestive of a immunological flare-up. No overt/localized findings of infection. 3. Her clinical presentation is attributable to polymyositis and the complications of its therapy (steroids for >20 years). Her Rheumatologist is Dr. Carolan Clines in [place]. 4. Will ask hospitalist service to assume care.  Time Spent Directly with Patient:  30 minutes  Length of Stay:  LOS: 2 days    Baudelio Karnes 01/15/2013, 8:29 AM

## 2013-01-16 DIAGNOSIS — R7989 Other specified abnormal findings of blood chemistry: Secondary | ICD-10-CM

## 2013-01-16 LAB — MYOGLOBIN, URINE: Myoglobin, Ur: <27 mcg/L (ref <28)

## 2013-01-16 MED ORDER — BISACODYL 10 MG RE SUPP
10.0000 mg | Freq: Once | RECTAL | Status: AC
  Administered 2013-01-16: 10 mg via RECTAL
  Filled 2013-01-16: qty 1

## 2013-01-16 MED ORDER — FUROSEMIDE 20 MG PO TABS
20.0000 mg | ORAL_TABLET | Freq: Every day | ORAL | Status: DC
  Administered 2013-01-16 – 2013-01-17 (×2): 20 mg via ORAL
  Filled 2013-01-16 (×2): qty 1

## 2013-01-16 MED ORDER — DOCUSATE SODIUM 100 MG PO CAPS
100.0000 mg | ORAL_CAPSULE | Freq: Every day | ORAL | Status: DC
  Administered 2013-01-16 – 2013-01-17 (×2): 100 mg via ORAL
  Filled 2013-01-16: qty 1

## 2013-01-16 MED ORDER — PREDNISONE 50 MG PO TABS
50.0000 mg | ORAL_TABLET | Freq: Every day | ORAL | Status: DC
Start: 2013-01-17 — End: 2013-01-17
  Administered 2013-01-17: 50 mg via ORAL
  Filled 2013-01-16 (×3): qty 1

## 2013-01-16 MED ORDER — SENNOSIDES-DOCUSATE SODIUM 8.6-50 MG PO TABS
1.0000 | ORAL_TABLET | Freq: Every day | ORAL | Status: DC
  Administered 2013-01-16: 1 via ORAL
  Filled 2013-01-16 (×2): qty 1

## 2013-01-16 NOTE — Consult Note (Addendum)
PULMONARY  / CRITICAL CARE MEDICINE  Name: Allison Beasley MRN: [id number] DOB: 02/12/1961    ADMISSION DATE:  01/13/2013 CONSULTATION DATE:  01/16/13   REFERRING MD :  Sharl Ma, [facility]  CHIEF COMPLAINT:  dyspnea  BRIEF PATIENT DESCRIPTION: 52 yo, never smoker, hx polymyositis + fibromyalgia + Raynaud's on chronic corticosteroids. Admitted 3/23 w dyspnea, possible NSTEMI.   SIGNIFICANT EVENTS / STUDIES:  TTE 3/24 >> low normal LV fxn 50-55%, mild calcification anterior MV leaflet, normal RV fxn, normal PAP's  LINES / TUBES:  CULTURES: MRSA screen 3/23 >> negative  ANTIBIOTICS: none  HISTORY OF PRESENT ILLNESS:  52 yo, never smoker, hx polymyositis + fibromyalgia + Raynaud's on chronic corticosteroids. Evaluated 3/23 with HA, foul taste in mouth, ? Sinus drainage. Also described DOE worse over 2 weeks.  She underwent CT scan chest at [facility] that is negative for PE, shows some apical and basilar interstitial disease with honeycomb changes. She had slightly elevated cardiac enzymes and was admitted for evaluation of cardiac status in setting exertional dyspnea. No ECG changes, reassuring TTE. PCCM consulted regarding her dyspnea and interstitial infiltrates  PAST MEDICAL HISTORY :  Past Medical History  Diagnosis Date  . Osteoarthritis   . Polymyositis   . Fibromyalgia   . Osteopenia   . Raynaud phenomenon   . Polymyositis   . Constipation   . Shortness of breath   . Diabetes mellitus without complication   . GERD (gastroesophageal reflux disease)   . Headache   . Anemia    Past Surgical History  Procedure Laterality Date  . Cesarean section      x3  . Tubal ligation    . Hysteroscopy      w/ D&C with excision of endometrial polyps  . Left great toenail removal  Left 2013    done by [place] [facility]  . Tonsillectomy Bilateral   . Amputation Left 12/24/2012    Procedure: AMPUTATION DIGIT left great toe;  Surgeon: Sherren Kerns, MD;  Location: [facility] OR;  Service: Vascular;   Laterality: Left;  GREAT   Prior to Admission medications   Medication Sig Start Date End Date Taking? Authorizing Provider  aspirin EC 325 MG tablet Take 325 mg by mouth daily.   Yes Historical Provider, MD  buPROPion (WELLBUTRIN XL) 150 MG 24 hr tablet Take 150 mg by mouth daily.   Yes Historical Provider, MD  docusate sodium (COLACE) 100 MG capsule Take 100 mg by mouth daily.   Yes Historical Provider, MD  ferrous sulfate 325 (65 FE) MG EC tablet Take 325 mg by mouth daily with breakfast.   Yes Historical Provider, MD  HYDROcodone-acetaminophen (NORCO/VICODIN) 5-325 MG per tablet Take 1-2 tablets by mouth every 6 (six) hours as needed for pain.   Yes Historical Provider, MD  methylPREDNISolone (MEDROL) 4 MG tablet Take 4 mg by mouth daily.   Yes Historical Provider, MD  mupirocin ointment (BACTROBAN) 2 % Apply 1 application topically 2 (two) times daily as needed (for bed sores).    Yes Historical Provider, MD  NIFEdipine (PROCARDIA-XL/ADALAT CC) 30 MG 24 hr tablet Take 30 mg by mouth daily. 12/14/12  Yes Historical Provider, MD  omeprazole (PRILOSEC) 40 MG capsule Take 40 mg by mouth every morning.    Yes Historical Provider, MD  alendronate (FOSAMAX) 70 MG tablet Take 70 mg by mouth every 7 (seven) days. Take with a full glass of water on an empty stomach. Takes on Monday.    Historical Provider, MD  Allergies  Allergen Reactions  . Tape Itching    Plastic tape causes itching    FAMILY HISTORY:  Family History  Problem Relation Age of Onset  . Diabetes Mother   . Hypertension Mother   . Diabetes Father   . Heart disease Father   . Hypertension Sister   . Heart disease Sister    SOCIAL HISTORY:  reports that she has never smoked. She has never used smokeless tobacco. She reports that she does not drink alcohol or use illicit drugs.  REVIEW OF SYSTEMS:   Constitutional: Negative for fever, chills, weight loss, malaise/fatigue and diaphoresis.  HENT: Negative for hearing loss,  ear pain, nosebleeds, congestion, sore throat, neck pain, tinnitus and ear discharge.   Eyes: Negative for blurred vision, double vision, photophobia, pain, discharge and redness.  Respiratory: Negative for cough, hemoptysis, sputum production, shortness of breath, wheezing and stridor.   Cardiovascular: Negative for chest pain, palpitations, orthopnea, claudication, leg swelling and PND.  Gastrointestinal: Negative for heartburn, nausea, vomiting, abdominal pain, diarrhea, constipation, blood in stool and melena.  Genitourinary: Negative for dysuria, urgency, frequency, hematuria and flank pain.  Musculoskeletal: Negative for myalgias, back pain, joint pain and falls.  Skin: Negative for itching and rash.  Neurological: Negative for dizziness, tingling, tremors, sensory change, speech change, focal weakness, seizures, loss of consciousness, weakness and headaches.  Endo/Heme/Allergies: Negative for environmental allergies and polydipsia. Does not bruise/bleed easily.  SUBJECTIVE:   VITAL SIGNS: Temp:  [98.6 F (37 C)-98.8 F (37.1 C)] 98.6 F (37 C) (03/26 1324) Pulse Rate:  [85-90] 85 (03/26 1324) Resp:  [18] 18 (03/26 1324) BP: (104-108)/(67-75) 106/75 mmHg (03/26 1324) SpO2:  [100 %] 100 % (03/26 1324)  PHYSICAL EXAMINATION: General:  Well appearing, NAD on RA Neuro:  Awake, alert, non-focal HEENT:  OP clear, no sinus tenderness Neck:  No stridor Cardiovascular:  Regular, distant, no M Lungs:  Bibasilar crackles, R > L, no wheeze Abdomen:  benign Musculoskeletal:  Significant calcinosis in groin and buttocks Skin:  No rash   Recent Labs Lab 01/13/13 1820  NA 139  K 4.5  CL 103  CO2 24  BUN 13  CREATININE 0.70  GLUCOSE 86    Recent Labs Lab 01/13/13 1820  HGB 11.9*  HCT 36.2  WBC 7.3  PLT 233   No results found.  ASSESSMENT / PLAN: 1) Dyspnea - etiology unclear. Primary cardiac contribution seems unlikely given workup so far. DDx includes auto-immune  mediated interstitial lung disease, opportunistic infxn in a pt on chronic steroids. TTE does not support PAH. 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 She is already being followed by Dr Marvis Moeller at [facility]. Tells me that she has had PFT before. She is on RA, and suspect that her w/u can be undertaken as an outpt with Dr Marvis Moeller - she states that she would prefer this.  - unclear whether we should leave her on Prednisone. Her CT-PA from 3/23 does not show any significant ground glass infiltrate. Not clear what we are treating here. I would probably stop the steroids and plan for her to follow in her own pulm clinic unless discussions with Dr Marvis Moeller favor leaving her on prednisone.  - I will defer ordering PFT, high res CT scan since she already has a w/u underway in [place] - please call if we can assist you or if the patient does not want to do this as an  outpt  Levy Pupa, MD, PhD 01/16/2013, 3:18 PM [facility] [phone] or if no answer [phone]

## 2013-01-16 NOTE — Progress Notes (Signed)
[facility] PROGRESS NOTE  Allison Beasley AVW:[id number] DOB: 1961-09-28 DOA: 01/13/2013 PCP: Juline Patch, MD  Interval history 52 y/o with a history of Raynauds, polymyositis, and fibromyalgia. 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 she went to an Urgent Care because of sinus issues. She reported significant SOB when walking from her car to the Urgent Care. In the Urgent Care they problems getting an O2 sat (Raynauds). She was sent to [facility] ER. CT at [facility] was negative. Her Troponin was slightly elevated at 0.2, Her CK was 1800 with an MB of 32. There were  no EKG changes. Patient was initially admitted under cardiology service and as all the work up was essentially negative for cardiac cause, she has been transferred to [facility] service.   Assessment/Plan: Dyspnea-  ? Cause, cardiac work up is negative. Echo shows no abnormality, pulmonary artery pressure is normal. Will consult with pulmonary service to help with management. Will start low dose lasix for mild pulmonary vascular congestion. Also start her on prednisone 50 mg po daily  Polymyositis-patient has ben on methylpredisolone at home, will continue on steroids.  S/p left big toe amputation Stable  Transaminitis ? Cause Will repeat LFt's in am.  Depression Continue Wellbutrin  Raynauds disease Continue nifedipine and bystolic  Code Status: Full Code Family Communication: spoke to the patient  Disposition Plan: Home when stable   Consultants:  Pulmonary  Procedures:  None  Antibiotics:    HPI/Subjective: Patient seen, still gets short of breath on walking.  Objective: Filed Vitals:   01/15/13 1113 01/15/13 1942 01/16/13 0440 01/16/13 1016  BP: 99/67 108/72 105/67 104/74  Pulse: 92 85 90 89  Temp: 98.7 F (37.1 C) 98.8 F (37.1 C) 98.6 F (37 C)   TempSrc: Oral Oral Oral   Resp: 18 18 18    Height:       Weight:      SpO2: 97% 100% 100%     Intake/Output Summary (Last 24 hours) at 01/16/13 1234 Last data filed at 01/16/13 0500  Gross per 24 hour  Intake    360 ml  Output      0 ml  Net    360 ml   Filed Weights   01/13/13 1735  Weight: 51.9 kg (114 lb 6.7 oz)    Exam:   General:  Appear in no acute distress  Cardiovascular: s1s2 RRR  Respiratory: faint bibasilar crackles  Abdomen: Soft, nontender  Musculoskeletal: No edema in lower extremities  Data Reviewed: Basic Metabolic Panel:  Recent Labs Lab 01/13/13 1820  NA 139  K 4.5  CL 103  CO2 24  GLUCOSE 86  BUN 13  CREATININE 0.70  CALCIUM 8.8  MG 2.3   Liver Function Tests:  Recent Labs Lab 01/13/13 1820  AST 158*  ALT 86*  ALKPHOS 94  BILITOT 0.3  PROT 8.5*  ALBUMIN 2.9*   No results found for this basename: LIPASE, AMYLASE,  in the last 168 hours No results found for this basename: AMMONIA,  in the last 168 hours CBC:  Recent Labs Lab 01/13/13 1820  WBC 7.3  NEUTROABS 5.8  HGB 11.9*  HCT 36.2  MCV 89.4  PLT 233   Cardiac Enzymes:  Recent Labs Lab 01/13/13 1820 01/14/13 0525  CKTOTAL 1690*  --   CKMB 26.8*  --   TROPONINI <0.30 <0.30   BNP (last 3 results)  Recent Labs  01/13/13  1820  PROBNP 44.6   CBG: No results found for this basename: GLUCAP,  in the last 168 hours  Recent Results (from the past 240 hour(s))  MRSA PCR SCREENING     Status: None   Collection Time    01/13/13  5:42 PM      Result Value Range Status   MRSA by PCR NEGATIVE  NEGATIVE Final   Comment:            The GeneXpert MRSA Assay (FDA     approved for NASAL specimens     only), is one component of a     comprehensive MRSA colonization     surveillance program. It is not     intended to diagnose MRSA     infection nor to guide or     monitor treatment for     MRSA infections.     Studies: No results found.  Scheduled Meds: . aspirin EC  325 mg Oral Daily  . bisacodyl  10 mg  Rectal Once  . buPROPion  150 mg Oral Daily  . docusate sodium  100 mg Oral Daily  . enoxaparin (LOVENOX) injection  40 mg Subcutaneous QHS  . ferrous sulfate  325 mg Oral Q breakfast  . furosemide  20 mg Oral Daily  . nebivolol  2.5 mg Oral Daily  . NIFEdipine  30 mg Oral Daily  . pantoprazole  40 mg Oral Q1200  . [START ON 01/17/2013] predniSONE  50 mg Oral Q breakfast  . senna-docusate  1 tablet Oral QHS   Continuous Infusions:   Principal Problem:   Dyspnea Active Problems:   Elevated troponin borderline -- no true elevation, resolved.   Raynaud's disease   Polymyositis   Fibromyalgia    Time spent:     [facility]  [facility] Pager [phone]. If 7PM-7AM, please contact night-coverage at [email or web address], password [facility] 01/16/2013, 12:34 PM  LOS: 3 days

## 2013-01-17 DIAGNOSIS — M3320 Polymyositis, organ involvement unspecified: Secondary | ICD-10-CM

## 2013-01-17 DIAGNOSIS — R0609 Other forms of dyspnea: Principal | ICD-10-CM

## 2013-01-17 DIAGNOSIS — I7300 Raynaud's syndrome without gangrene: Secondary | ICD-10-CM

## 2013-01-17 DIAGNOSIS — IMO0001 Reserved for inherently not codable concepts without codable children: Secondary | ICD-10-CM

## 2013-01-17 DIAGNOSIS — R0989 Other specified symptoms and signs involving the circulatory and respiratory systems: Principal | ICD-10-CM

## 2013-01-17 LAB — COMPREHENSIVE METABOLIC PANEL
Albumin: 2.8 g/dL — ABNORMAL LOW (ref 3.5–5.2)
BUN: 19 mg/dL (ref 6–23)
CO2: 24 mEq/L (ref 19–32)
Chloride: 98 mEq/L (ref 96–112)
Creatinine, Ser: 0.67 mg/dL (ref 0.50–1.10)
GFR calc Af Amer: >90 mL/min (ref >90)
GFR calc non Af Amer: >90 mL/min (ref >90)
Glucose, Bld: 94 mg/dL (ref 70–99)
Total Bilirubin: 0.3 mg/dL (ref 0.3–1.2)

## 2013-01-17 MED ORDER — FUROSEMIDE 20 MG PO TABS: 20.0000 mg | ORAL_TABLET | Freq: Every day | ORAL | Status: DC

## 2013-01-17 MED ORDER — NEBIVOLOL HCL 2.5 MG PO TABS: 2.5000 mg | ORAL_TABLET | Freq: Every day | ORAL | Status: DC

## 2013-01-17 NOTE — Progress Notes (Signed)
Pt/family given discharge instructions, medication lists, follow up appointments, and when to call the doctor.  Pt/family verbalizes understanding. Alysson Geist McClintock    

## 2013-01-17 NOTE — Discharge Summary (Signed)
Physician Discharge Summary  Allison Beasley ZOX:[id number] DOB: November 19, 1960 DOA: 01/13/2013  PCP: Juline Patch, MD  Admit date: 01/13/2013 Discharge date: 01/17/2013  Time spent:  Recommendations for Outpatient Follow-up:  1. Follow up PCP in one week to check the BMP as she is on lasix  2. Will need repeat LFT's and further work up for mild transaminitis  Discharge Diagnoses:  Principal Problem:   Dyspnea Active Problems:   Elevated troponin borderline -- no true elevation, resolved.   Raynaud's disease   Polymyositis   Fibromyalgia   Discharge Condition: Stable  Diet recommendation: low salt diet  Filed Weights   01/13/13 1735  Weight: 51.9 kg (114 lb 6.7 oz)    History of present illness:  52 y/o with a history of Raynauds, polymyositis, and fibromyalgia. 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 she went to an Urgent Care because of sinus issues. She reported significant SOB when walking from her car to the Urgent Care. In the Urgent Care they problems getting an O2 sat (Raynauds). She was sent to [facility] ER. CT at [facility] was negative. Her Troponin was slightly elevated at 0.2, Her CK was 1800 with an MB of 32. There were no EKG changes. Patient was initially admitted under cardiology service and as all the work up was essentially negative for cardiac cause, she has been transferred to [facility] service.   Hospital Course:  Dyspnea-  ? Cause, cardiac work up is negative. Echo shows no abnormality, pulmonary artery pressure is normal.  pulmonary service was consulted  to help with management. As per Pulmonary ,She is already being followed by Dr Marvis Moeller at [facility]. Tells me that she has had PFT before. She is on RA, and suspect that her w/u can be undertaken as an outpt with Dr Marvis Moeller - she states that she would prefer this.   Was started on  low dose lasix for mild  pulmonary vascular congestion. Has improved somewhat, and will discharge on Po Lasix 20 mg po daily  For seven days. Will need repeat BMP in one week  Polymyositis-patient has ben on methylpredisolone at home, will continue on home dose  S/p left big toe amputation  Stable   Transaminitis  ? Cause  Has been asymptomatic. Will need further work up as outpatient.  Depression  Continue Wellbutrin   Raynauds disease  Continue nifedipine and bystolic      Procedures:  2 D echocardiogram  Consultations:  Cardiology  Pulmonary  Discharge Exam: Filed Vitals:   01/16/13 1016 01/16/13 1324 01/16/13 1956 01/17/13 0547  BP: 104/74 106/75 109/69 114/69  Pulse: 89 85 91 98  Temp:  98.6 F (37 C) 98.7 F (37.1 C) 99 F (37.2 C)  TempSrc:  Oral Oral Oral  Resp:  18 18 18   Height:      Weight:      SpO2:  100% 99% 99%    General: Appear in no acute distress Cardiovascular: s1s2 RRR Respiratory: Clear bilaterally Ext : No edema  Discharge Instructions  Discharge Orders   Future Appointments Provider Department Dept Phone   01/24/2013 11:15 AM Sherren Kerns, MD [facility] [phone]   Future Orders Complete By Expires     Diet - low sodium heart healthy  As directed     Increase activity slowly  As directed         Medication List  TAKE these medications       aspirin EC 325 MG tablet  Take 325 mg by mouth daily.     buPROPion 150 MG 24 hr tablet  Commonly known as:  WELLBUTRIN XL  Take 150 mg by mouth daily.     docusate sodium 100 MG capsule  Commonly known as:  COLACE  Take 100 mg by mouth daily.     ferrous sulfate 325 (65 FE) MG EC tablet  Take 325 mg by mouth daily with breakfast.     FOSAMAX 70 MG tablet  Generic drug:  alendronate  Take 70 mg by mouth every 7 (seven) days. Take with a full glass of water on an empty stomach. Takes on Monday.     furosemide 20 MG tablet  Commonly known as:  LASIX  Take 1  tablet (20 mg total) by mouth daily.     HYDROcodone-acetaminophen 5-325 MG per tablet  Commonly known as:  NORCO/VICODIN  Take 1-2 tablets by mouth every 6 (six) hours as needed for pain.     methylPREDNISolone 4 MG tablet  Commonly known as:  MEDROL  Take 4 mg by mouth daily.     mupirocin ointment 2 %  Commonly known as:  BACTROBAN  Apply 1 application topically 2 (two) times daily as needed (for bed sores).     nebivolol 2.5 MG tablet  Commonly known as:  BYSTOLIC  Take 1 tablet (2.5 mg total) by mouth daily.     NIFEdipine 30 MG 24 hr tablet  Commonly known as:  PROCARDIA-XL/ADALAT CC  Take 30 mg by mouth daily.     omeprazole 40 MG capsule  Commonly known as:  PRILOSEC  Take 40 mg by mouth every morning.           Follow-up Information   Follow up with PANG,RICHARD, MD In 1 week. (Follow up for lab work BMP in one week)    Contact information:   [address] [place] [address], [address] [place] [address] [phone]        The results of significant diagnostics from this hospitalization (including imaging, microbiology, ancillary and laboratory) are listed below for reference.    Significant Diagnostic Studies: No results found.  Microbiology: Recent Results (from the past 240 hour(s))  MRSA PCR SCREENING     Status: None   Collection Time    01/13/13  5:42 PM      Result Value Range Status   MRSA by PCR NEGATIVE  NEGATIVE Final   Comment:            The GeneXpert MRSA Assay (FDA     approved for NASAL specimens     only), is one component of a     comprehensive MRSA colonization     surveillance program. It is not     intended to diagnose MRSA     infection nor to guide or     monitor treatment for     MRSA infections.     Labs: Basic Metabolic Panel:  Recent Labs Lab 01/13/13 1820 01/17/13 0625  NA 139 134*  K 4.5 4.6  CL 103 98  CO2 24 24  GLUCOSE 86 94  BUN 13 19  CREATININE 0.70 0.67  CALCIUM 8.8 8.8  MG 2.3  --    Liver Function  Tests:  Recent Labs Lab 01/13/13 1820 01/17/13 0625  AST 158* 169*  ALT 86* 88*  ALKPHOS 94 102  BILITOT 0.3 0.3  PROT 8.5* 8.7*  ALBUMIN 2.9* 2.8*   No results found for this basename: LIPASE, AMYLASE,  in the last 168 hours No results found for this basename: AMMONIA,  in the last 168 hours CBC:  Recent Labs Lab 01/13/13 1820  WBC 7.3  NEUTROABS 5.8  HGB 11.9*  HCT 36.2  MCV 89.4  PLT 233   Cardiac Enzymes:  Recent Labs Lab 01/13/13 1820 01/14/13 0525  CKTOTAL 1690*  --   CKMB 26.8*  --   TROPONINI <0.30 <0.30   BNP: BNP (last 3 results)  Recent Labs  01/13/13 1820  PROBNP 44.6   CBG: No results found for this basename: GLUCAP,  in the last 168 hours     Signed:  Dontrail Blackwell S  [facility] 01/17/2013, 1:16 PM

## 2013-01-17 NOTE — Care Management Note (Signed)
    Page 1 of 2   01/17/2013     4:35:04 PM   CARE MANAGEMENT NOTE 01/17/2013  Patient:  THAMARA, LEGER   Account Number:  [id number]  Date Initiated:  01/14/2013  Documentation initiated by:  [facility]  Subjective/Objective Assessment:   Admitted with elevated troponins -  Lives at home with spouse.  has had [facility] HH for PT/OT for toe amputation the first of March.     Action/Plan:   Anticipated DC Date:  01/16/2013   Anticipated DC Plan:  HOME W HOME HEALTH SERVICES      DC Planning Services  CM consult      [facility] Choice  Resumption Of Svcs/PTA Provider   Choice offered to / List presented to:  C-1 Patient        HH arranged  HH-1 RN  HH-2 PT  HH-3 OT      [facility] agency  [facility]   Status of service:  Completed, signed off Medicare Important Message given?   (If response is "NO", the following Medicare IM given date fields will be blank) Date Medicare IM given:   Date Additional Medicare IM given:    Discharge Disposition:  HOME W HOME HEALTH SERVICES  Per UR Regulation:  Reviewed for med. necessity/level of care/duration of stay  If discussed at Long Length of Stay Meetings, dates discussed:    Comments:  ContactGerldine, Suleiman Spouse [phone]   [phone]                 Gregory,Crystal Daughter [phone]   01/17/13 Jeryl Wilbourn,RN,BSN [phone] PT FOR DC HOME TODAY.  TO RESUME HH SERVICES WITH [facility]. WILL ADD HHRN TO HHPT AND OT.  START OF CARE 24-48H POST DC DATE.

## 2013-01-23 ENCOUNTER — Encounter: Payer: Self-pay | Admitting: Vascular Surgery

## 2013-01-24 ENCOUNTER — Ambulatory Visit (INDEPENDENT_AMBULATORY_CARE_PROVIDER_SITE_OTHER): Payer: BC Managed Care – PPO | Admitting: Vascular Surgery

## 2013-01-24 ENCOUNTER — Encounter: Payer: Self-pay | Admitting: Vascular Surgery

## 2013-01-24 VITALS — BP 106/82 | HR 105 | Temp 98.6°F | Ht 61.0 in | Wt 116.0 lb

## 2013-01-24 DIAGNOSIS — I7025 Atherosclerosis of native arteries of other extremities with ulceration: Secondary | ICD-10-CM | POA: Insufficient documentation

## 2013-01-24 DIAGNOSIS — I739 Peripheral vascular disease, unspecified: Secondary | ICD-10-CM | POA: Insufficient documentation

## 2013-01-24 DIAGNOSIS — I70269 Atherosclerosis of native arteries of extremities with gangrene, unspecified extremity: Secondary | ICD-10-CM | POA: Insufficient documentation

## 2013-01-24 NOTE — Progress Notes (Signed)
The patient is status post amputation of her left first toe in March 3. She returns today for followup. She was doing well until a few days ago began to have some pain in her foot. She also has several rheumatologic problems and due to her decreased steroid dose she has had some overall aches and pains recently.  Physical exam:  Filed Vitals:   01/24/13 1126  BP: 106/82  Pulse: 105  Temp: 98.6 F (37 C)  TempSrc: Oral  Height: 5\' 1"  (1.549 m)  Weight: 116 lb (52.617 kg)  SpO2: 78%   Right first toe: Sutures intact no areas of opening small amount of dried what appears to be old blood surrounding the incision. No fluctuance no erythema  Assessment: Healing first toe amputation sutures removed today  Plan: Followup 3-4 weeks to make sure there is completion wound healing. If at the time of her followup in the wound is completely healed we will consider sending her for prosthetic shoe insert.  Fabienne Bruns, MD [facility] [place] Office: [phone] Pager: [phone]

## 2013-02-13 ENCOUNTER — Encounter: Payer: Self-pay | Admitting: Vascular Surgery

## 2013-02-14 ENCOUNTER — Ambulatory Visit (INDEPENDENT_AMBULATORY_CARE_PROVIDER_SITE_OTHER): Payer: BC Managed Care – PPO | Admitting: Vascular Surgery

## 2013-02-14 VITALS — BP 105/66 | HR 116 | Temp 97.8°F | Resp 18 | Ht 61.0 in | Wt 115.0 lb

## 2013-02-14 DIAGNOSIS — Z48812 Encounter for surgical aftercare following surgery on the circulatory system: Secondary | ICD-10-CM | POA: Insufficient documentation

## 2013-02-14 DIAGNOSIS — I70269 Atherosclerosis of native arteries of extremities with gangrene, unspecified extremity: Secondary | ICD-10-CM

## 2013-02-14 NOTE — Progress Notes (Signed)
Patient is a 52 year old female who is status post right amputation of the left first toe on 12/24/2012. She returns today for followup. He still has some soreness in the toe but is walking some. She denies any drainage from the incision.  Physical exam:  Filed Vitals:   02/14/13 0845  BP: 105/66  Pulse: 116  Temp: 97.8 F (36.6 C)  TempSrc: Oral  Resp: 18  Height: 5\' 1"  (1.549 m)  Weight: 115 lb (52.164 kg)  SpO2: 95%   Left lower extremity: Toe amputation essentially healed 1 cm area of eschar in the central segment no erythema no drainage 2 cm callous over the metatarsal head  Assessment: Healing left first toe amputation  Plan: Patient was given a prescription today [facility] a shoe insert [facility]. She will followup on as-needed basis.  Fabienne Bruns, MD [facility] [place] Office: [phone] Pager: [phone]

## 2013-02-19 ENCOUNTER — Telehealth: Payer: Self-pay

## 2013-02-19 NOTE — Telephone Encounter (Signed)
Phone call from patient.  Reported she saw Dr. Darrick Penna last week, on Thursday, and incisional area was healing @ left 1st toe amp. site.  Reported took shower on Saturday, and applied a dressing.  When bandage removed on Sunday, noted a small amt. of yellow drainage.  Reported she showered Sunday, and applied dsg.  Noted small amt. yellow drainage, and some redness at incisional area on Monday.  Denies fever.  Stated she restarted her steroid on Monday, " Nyfortic".  Concerned about using her shoe insert, due to the drainage and redness.  Advised to continue to monitor area, and keep site clean and dry; encouraged to change dressing as often as necessary to keep site dry.  Encouraged to call on 5/1 with update on symptoms, and will discuss w/ Dr. Darrick Penna, when he is in office on same day.  Verb. understanding.

## 2013-02-21 NOTE — Telephone Encounter (Signed)
Called pt. to check on status of incisional area (L) 1st toe amp. site.  States she has kept incisional area clean/dry and exposed to air; only wearing a clean,white sock.  States there is a sm. circle of pink tissue; "It looks like where a scab has fallen off".  Denies any drainage.  States "it looks better since I have kept the dressing off."  Denies fever or chills.  Voiced concern about wearing a shoe insert and having further irritation.  States supposed to get the shoe insert next week.  Appt. offered for 5/8 to check site.  States she will call to cancel, if continued improvement.

## 2013-02-27 ENCOUNTER — Encounter: Payer: Self-pay | Admitting: Vascular Surgery

## 2013-02-28 ENCOUNTER — Ambulatory Visit (INDEPENDENT_AMBULATORY_CARE_PROVIDER_SITE_OTHER): Payer: BC Managed Care – PPO | Admitting: Vascular Surgery

## 2013-02-28 ENCOUNTER — Encounter: Payer: Self-pay | Admitting: Vascular Surgery

## 2013-02-28 VITALS — BP 117/77 | HR 80 | Temp 98.1°F | Ht 61.0 in | Wt 113.4 lb

## 2013-02-28 DIAGNOSIS — I70269 Atherosclerosis of native arteries of extremities with gangrene, unspecified extremity: Secondary | ICD-10-CM

## 2013-02-28 DIAGNOSIS — IMO0002 Reserved for concepts with insufficient information to code with codable children: Secondary | ICD-10-CM

## 2013-02-28 DIAGNOSIS — S91101S Unspecified open wound of right great toe without damage to nail, sequela: Secondary | ICD-10-CM

## 2013-02-28 MED ORDER — COLLAGENASE 250 UNIT/GM EX OINT: TOPICAL_OINTMENT | Freq: Every day | CUTANEOUS | Status: DC

## 2013-02-28 NOTE — Progress Notes (Signed)
Patient is status post amputation of her right first toe March 3. She presented back to the office today complaining of some yellowish drainage. She denies any fever or chills. Her steroids were recently increased for her polymyalgia rheumatica.  Physical exam:  Filed Vitals:   02/28/13 1356  BP: 117/77  Pulse: 80  Temp: 98.1 F (36.7 C)  TempSrc: Oral  Height: 5\' 1"  (1.549 m)  Weight: 113 lb 6.4 oz (51.438 kg)  SpO2: 99%   Right first toe 1 cm opening no real expressible drainage today some clear wound exit date some fibrinous exudate tissue was debrided from the tip of the toes today. There is some granulation tissue beneath this. There is no bone exposed.  Assessment: Slowly healing right first toe amputation. Although worrisome for residual underlying osteomyelitis.  Plan: Will start local wound care with collagenase today. She will return in 2 weeks for followup. If she continues to have persistent drainage we will consider a bone scan or MRI to rule out osteomyelitis of the metatarsal segment. The patient will discuss with her rheumatologist possibly decreasing her steroid dose for now until we can get her toe amputation healed.  Fabienne Bruns, MD [facility] [place] Office: [phone] Pager: [phone]

## 2013-03-20 ENCOUNTER — Encounter: Payer: Self-pay | Admitting: Vascular Surgery

## 2013-03-21 ENCOUNTER — Encounter: Payer: Self-pay | Admitting: Vascular Surgery

## 2013-03-21 ENCOUNTER — Ambulatory Visit (INDEPENDENT_AMBULATORY_CARE_PROVIDER_SITE_OTHER): Payer: BC Managed Care – PPO | Admitting: Vascular Surgery

## 2013-03-21 VITALS — BP 138/89 | HR 97 | Temp 98.0°F | Ht 61.0 in | Wt 118.9 lb

## 2013-03-21 DIAGNOSIS — S91101S Unspecified open wound of right great toe without damage to nail, sequela: Secondary | ICD-10-CM

## 2013-03-21 DIAGNOSIS — IMO0002 Reserved for concepts with insufficient information to code with codable children: Secondary | ICD-10-CM

## 2013-03-21 DIAGNOSIS — S91103A Unspecified open wound of unspecified great toe without damage to nail, initial encounter: Secondary | ICD-10-CM | POA: Insufficient documentation

## 2013-03-21 NOTE — Progress Notes (Signed)
Patient is a 52 year old female status post left first toe amputation 12/24/2012. She returns today for followup.  Physical exam:  Filed Vitals:   03/21/13 1000  BP: 138/89  Pulse: 97  Temp: 98 F (36.7 C)  TempSrc: Oral  Height: 5\' 1"  (1.549 m)  Weight: 118 lb 14.4 oz (53.933 kg)  SpO2: 94%   Right foot no open wounds or ulcerations.  Left foot essentially healed left first toe amputation. There is an area 3 mm x 1 mm that has dry eschar. Apparently this had a small amount of serous drainage a few days ago but nothing recently. There is no erythema.  Assessment: Healed left first toe amputation  Plan: Followup as needed continue to protect left foot until this is completely healed  Fabienne Bruns, MD [facility] [place] Office: [phone] Pager: [phone]

## 2013-04-24 DIAGNOSIS — S98119A Complete traumatic amputation of unspecified great toe, initial encounter: Secondary | ICD-10-CM | POA: Insufficient documentation

## 2014-03-28 ENCOUNTER — Telehealth: Payer: Self-pay

## 2014-03-28 DIAGNOSIS — R209 Unspecified disturbances of skin sensation: Secondary | ICD-10-CM

## 2014-03-28 DIAGNOSIS — M79609 Pain in unspecified limb: Secondary | ICD-10-CM

## 2014-03-28 NOTE — Telephone Encounter (Signed)
Phone call from pt.  Reported a 2 week hx of right leg pain.  Stated she feels a "pinch", near the lower buttocks, that pain travels down the leg, to her toes.  Stated her right leg hurts all the time; verb. being unable to walk much due to the pain.   Also stated her "right leg feels frozen" at times.  Also stated her "right leg feels cold, and gets sweaty."  Reports has a bluish discoloration of the right foot at times.  Currently reports right LE warm and has "normal color."  Denies open sores.  Denies any swelling of right LE/ foot.  Recommended she may need to have evaluation, by PCP, for Sciatic nerve pain.  Stated she was advised by her PCP to take Hydrocodone for the pain.  Advised will sched. appt. for evaluation for peripheral vascular disease.  Encouraged to call office or go to ER if pain becomes unmanageable.  Verb. Understanding.

## 2014-03-31 NOTE — Telephone Encounter (Signed)
Spoke with patient to schedule an appointment for 04/10/14 @ 130, dpm

## 2014-04-09 ENCOUNTER — Encounter: Payer: Self-pay | Admitting: Family

## 2014-04-10 ENCOUNTER — Ambulatory Visit (HOSPITAL_COMMUNITY)
Admission: RE | Admit: 2014-04-10 | Discharge: 2014-04-10 | Disposition: A | Discharge status: Home / Self Care | Payer: BC Managed Care – PPO | Source: Ambulatory Visit | Attending: Family | Admitting: Family

## 2014-04-10 ENCOUNTER — Encounter: Payer: Self-pay | Admitting: Family

## 2014-04-10 ENCOUNTER — Ambulatory Visit (INDEPENDENT_AMBULATORY_CARE_PROVIDER_SITE_OTHER): Payer: BC Managed Care – PPO | Admitting: Family

## 2014-04-10 VITALS — BP 114/76 | HR 87 | Resp 16 | Ht 61.0 in | Wt 129.0 lb

## 2014-04-10 DIAGNOSIS — R238 Other skin changes: Secondary | ICD-10-CM

## 2014-04-10 DIAGNOSIS — M79609 Pain in unspecified limb: Secondary | ICD-10-CM

## 2014-04-10 DIAGNOSIS — R209 Unspecified disturbances of skin sensation: Secondary | ICD-10-CM

## 2014-04-10 DIAGNOSIS — M79604 Pain in right leg: Secondary | ICD-10-CM

## 2014-04-10 NOTE — Patient Instructions (Addendum)

## 2014-04-10 NOTE — Progress Notes (Signed)
[facility] [place] HISTORY AND PHYSICAL -PAD  History of Present Illness TREINA Beasley is a 53 y.o. female patient of Dr. 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 She returns today with C/O Pain in Right leg with intermittent cold Beasley, Allison 4 wks. Pain keeps pt up at night. Body aches and malaise a week after TDAP immunization given in her deltoid muscle a month ago; right leg pain started 2-3 weeks after the injection. She stayed in her bed those 2-3 weeks then noted the right hip to foot pain, improved slightly in the last few days. 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 She also has Raynaud's Syndrome and RA. Before her right leg started hurting she was walking almost 2 miles daily. She denies non healing wounds in feet.  Pt Diabetic: Yes, secondary to corticosteroid use Pt smoker: non-smoker  Pt meds include: Statin :No, states her cholesterol is good Betablocker: Yes ASA: No Other anticoagulants/antiplatelets: no  Past Medical History  Diagnosis Date  . Osteoarthritis   . Polymyositis   . Fibromyalgia   . Osteopenia   . Raynaud phenomenon   . Polymyositis   . Constipation   . Shortness of breath   . Diabetes mellitus without complication   . GERD (gastroesophageal reflux disease)   . Headache(784.0)   . Anemia     Social History History  Substance Use Topics  . Smoking status: Never Smoker   . Smokeless tobacco: Never Used  . Alcohol Use: No     Comment: occasional use only    Family History Family History  Problem Relation Age of Onset  . Diabetes Mother   . Hypertension Mother   . Diabetes Father   . Heart disease Father   . Hypertension Sister   . Heart disease Sister     Past Surgical History  Procedure Laterality Date  . Cesarean section      x3  . Tubal ligation    . Hysteroscopy      w/ D&C with excision of endometrial polyps  . Left great  toenail removal  Left 2013    done by [facility]  . Tonsillectomy Bilateral   . Amputation Left 12/24/2012    Procedure: AMPUTATION DIGIT left great toe;  Surgeon: Sherren Kerns, MD;  Location: [facility] OR;  Service: Allison;  Laterality: Left;  GREAT    Allergies  Allergen Reactions  . Tape Itching    Plastic tape causes itching-USE PAPER TAPE ONLY    Current Outpatient Prescriptions  Medication Sig Dispense Refill  . alendronate (FOSAMAX) 70 MG tablet Take 70 mg by mouth every 7 (seven) days. Take with a full glass of water on an empty stomach. Takes on Monday.      Marland Kitchen AMITRIPTYLINE HCL PO Take 25 mg by mouth at bedtime.      Marland Kitchen buPROPion (WELLBUTRIN XL) 150 MG 24 hr tablet Take 150 mg by mouth daily.      Marland Kitchen escitalopram (LEXAPRO) 10 MG tablet daily.      . ferrous sulfate 325 (65 FE) MG EC tablet Take 325 mg by mouth daily with breakfast.      . HYDROcodone-acetaminophen (NORCO/VICODIN) 5-325 MG per tablet Take 1-2 tablets by mouth every 6 (six) hours as needed for pain.      . methylPREDNISolone (MEDROL) 4 MG tablet Take 4 mg by mouth daily.      . mupirocin ointment (BACTROBAN) 2 %  Apply 1 application topically 2 (two) times daily as needed (for bed sores).       . MYFORTIC 180 MG EC tablet Take 1 tablet by mouth 2 (two) times daily.      Marland Kitchen. NIFEdipine (PROCARDIA-XL/ADALAT CC) 30 MG 24 hr tablet Take 30 mg by mouth daily.      Marland Kitchen. omeprazole (PRILOSEC) 40 MG capsule Take 40 mg by mouth every morning.       Marland Kitchen. aspirin EC 325 MG tablet Take 325 mg by mouth daily.      . collagenase (SANTYL) ointment Apply topically daily. Apply to right first toe once daily  15 g  1  . docusate sodium (COLACE) 100 MG capsule Take 100 mg by mouth daily.      . Fluconazole (DIFLUCAN PO) Take by mouth.      . furosemide (LASIX) 20 MG tablet Take 1 tablet (20 mg total) by mouth daily.  7 tablet  0  . mycophenolate (CELLCEPT) 500 MG tablet       . nebivolol (BYSTOLIC) 2.5 MG tablet Take 1 tablet (2.5 mg  total) by mouth daily.  30 tablet  2   No current facility-administered medications for this visit.    ROS: See HPI for pertinent positives and negatives.   Physical Examination  Filed Vitals:   04/10/14 1531  BP: 114/76  Pulse: 87  Resp: 16    General: A&O x 3, WDWN. Gait: normal Eyes: PERRLA. Pulmonary: CTAB, without wheezes , rales or rhonchi. Cardiac: regular Rythm , without detected murmur.         Carotid Bruits Left Right   Negative Negative  Aorta is not palpable. Radial pulses: right radial is 2+ palpable, left radial is not palpable, left brachial is 2+ palpable                           Allison EXAM: Extremities without ischemic changes  without Gangrene; without open wounds. Left great toe is surgically absent.                                                                                                          LE Pulses LEFT RIGHT       FEMORAL  3+ palpable  3+ palpable        POPLITEAL  not palpable   not palpable       POSTERIOR TIBIAL  not palpable   not palpable        DORSALIS PEDIS      ANTERIOR TIBIAL faintly palpable  faintly palpable    Abdomen: soft, NT, no masses. Skin: no rashes, no ulcers noted. Musculoskeletal: no muscle wasting or atrophy.  Neurologic: A&O X 3; Appropriate Affect ; Beasley: normal; MOTOR FUNCTION:  moving all extremities equally. Speech is fluent/normal. CN 2-12 intact.  ASSESSMENT: Allison Beasley is a 53 y.o. 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 She returns today with C/O Pain in Right leg with intermittent cold Beasley, Allison 4 wks. Pain  keeps pt up at night. Body aches and malaise a week after TDAP immunization given in her deltoid muscle a month ago; right leg pain started 2-3 weeks after the injection. She stayed in her bed those 2-3 weeks then noted the right hip to foot pain, improved slightly in the last few days. She has been on a corticosteroid for about 20 years for  polymyositis rheumatica. She also has Raynaud's Syndrome and RA. Before her right leg started hurting she was walking almost 2 miles daily. She denies non healing wounds in feet. Non compressible tibial arteries indicating medial calcification, but with biphasic waveforms. TBI's: Right: 0.26, Left: 0.48, both with dampened waveforms. Her DP pulses are faintly palpable, toes of both feet are cool but no signs of ischemia. Right leg pain has improved in the last couple of days. She does not classic claudication symptoms with walking, walking does aggravate the right leg pain, but this is in the background of polymyositis rheumatica, osteoarthritis, fibromyalgia, and Raynaud's phenomenon. Her DM is secondary to her corticosteroid use for the PR. Will have her follow up with Dr. Darrick Penna to evaluate whether she needs further work up for PAD as the source of the right leg pain. She does have abnormal TBI's, and ABI's are not measurable due to tibial artery medial calcification.   PLAN:  I discussed in depth with the patient the nature of atherosclerosis, and emphasized the importance of maximal medical management including strict control of blood pressure, blood glucose, and lipid levels, obtaining regular exercise, and continued cessation of smoking.  The patient is aware that without maximal medical management the underlying atherosclerotic disease process will progress, limiting the benefit of any interventions.  Based on the patient's Allison studies and examination, pt will return to clinic in 2 weeks with Dr. Darrick Penna to evaluate whether further diagnostic Allison work up is necessary. Call if she develops non healing wounds or worsening pain in her legs.  The patient was given information about PAD including signs, symptoms, treatment, what symptoms should prompt the patient to seek immediate medical care, and risk reduction measures to take.  Charisse March, RN, MSN, FNP-C [facility] Phone: [phone]  Clinic MD: Myra Gianotti on call  04/10/2014 3:45 PM

## 2014-04-23 ENCOUNTER — Encounter: Payer: Self-pay | Admitting: Vascular Surgery

## 2014-04-24 ENCOUNTER — Ambulatory Visit (INDEPENDENT_AMBULATORY_CARE_PROVIDER_SITE_OTHER): Payer: BC Managed Care – PPO | Admitting: Vascular Surgery

## 2014-04-24 ENCOUNTER — Encounter: Payer: Self-pay | Admitting: Vascular Surgery

## 2014-04-24 VITALS — BP 159/99 | HR 81 | Ht 61.0 in | Wt 132.0 lb

## 2014-04-24 DIAGNOSIS — M79604 Pain in right leg: Secondary | ICD-10-CM

## 2014-04-24 DIAGNOSIS — I739 Peripheral vascular disease, unspecified: Secondary | ICD-10-CM | POA: Insufficient documentation

## 2014-04-24 DIAGNOSIS — M79609 Pain in unspecified limb: Secondary | ICD-10-CM

## 2014-04-24 NOTE — Progress Notes (Signed)
[facility] [place] HISTORY AND PHYSICAL    History of Present Illness:  Patient is a 53 y.o. year old female who presents for pain in the right first toe. She states that the toe is chronically painful. She takes Vicodin for the pain. 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 The patient has a long-standing history of multiple rheumatologic disorders. These include Raynaud's, arthritis, polymyositis. She also has a history of diabetes. All these are currently controlled. Other medical problems include fibromyalgia.    Past Medical History   Diagnosis  Date   .  Osteoarthritis     .  Polymyositis     .  Diabetes mellitus     .  Fibromyalgia     .  Osteopenia     .  Raynaud phenomenon     .  Heart disease     .  Polymyositis         Past Surgical History   Procedure  Laterality  Date   .  Cesarean section           x3   .  Tubal ligation       .  Hysteroscopy           w/ D&C with excision of endometrial polyps   .  Left great toenail removal   Left  2013       done by [facility]   .  Tonsillectomy  Bilateral        Social History History   Substance Use Topics   .  Smoking status:  Never Smoker    .  Smokeless tobacco:  Never Used   .  Alcohol Use:  Yes         Comment: occasional use only     Family History Family History   Problem  Relation  Age of Onset   .  Diabetes  Mother     .  Hypertension  Mother     .  Diabetes  Father     .  Heart disease  Father     .  Hypertension  Sister     .  Heart disease  Sister       Allergies  No Known Allergies  Current Outpatient Prescriptions on File Prior to Visit  Medication Sig Dispense Refill  . alendronate (FOSAMAX) 70 MG tablet Take 70 mg by mouth every 7 (seven) days. Take with a full glass of water on an empty stomach. Takes on Monday.      Marland Kitchen AMITRIPTYLINE HCL PO Take 25 mg by mouth at bedtime.      Marland Kitchen aspirin EC 325 MG tablet Take 325 mg by mouth  daily.      Marland Kitchen buPROPion (WELLBUTRIN XL) 150 MG 24 hr tablet Take 150 mg by mouth daily.      . collagenase (SANTYL) ointment Apply topically daily. Apply to right first toe once daily  15 g  1  . docusate sodium (COLACE) 100 MG capsule Take 100 mg by mouth daily.      Marland Kitchen escitalopram (LEXAPRO) 10 MG tablet daily.      . ferrous sulfate 325 (65 FE) MG EC tablet Take 325 mg by mouth daily with breakfast.      . Fluconazole (DIFLUCAN PO) Take by mouth.      . furosemide (LASIX) 20 MG tablet Take 1 tablet (20 mg total) by mouth daily.  7 tablet  0  .  HYDROcodone-acetaminophen (NORCO/VICODIN) 5-325 MG per tablet Take 1-2 tablets by mouth every 6 (six) hours as needed for pain.      . methylPREDNISolone (MEDROL) 4 MG tablet Take 4 mg by mouth daily.      . mupirocin ointment (BACTROBAN) 2 % Apply 1 application topically 2 (two) times daily as needed (for bed sores).       . mycophenolate (CELLCEPT) 500 MG tablet       . MYFORTIC 180 MG EC tablet Take 1 tablet by mouth 2 (two) times daily.      . nebivolol (BYSTOLIC) 2.5 MG tablet Take 1 tablet (2.5 mg total) by mouth daily.  30 tablet  2  . NIFEdipine (PROCARDIA-XL/ADALAT CC) 30 MG 24 hr tablet Take 30 mg by mouth daily.      Marland Kitchen omeprazole (PRILOSEC) 40 MG capsule Take 40 mg by mouth every morning.        No current facility-administered medications on file prior to visit.    ROS:    General:  No weight loss, Fever, chills  HEENT: No recent headaches, no nasal bleeding, no visual changes, no sore throat  Neurologic: No dizziness, blackouts, seizures. No recent symptoms of stroke or mini- stroke. No recent episodes of slurred speech, or temporary blindness.  Cardiac: No recent episodes of chest pain/pressure, no shortness of breath at rest.  No shortness of breath with exertion.  Denies history of atrial fibrillation or irregular heartbeat  Vascular: No history of rest pain in feet.  No history of claudication.  No history of non-healing ulcer,  No history of DVT    Pulmonary: No home oxygen, no productive cough, no hemoptysis,  No asthma or wheezing  Musculoskeletal:  [x ] Arthritis, [ ]  Low back pain,  [ ]  Joint pain  Hematologic:No history of hypercoagulable state.  No history of easy bleeding.  No history of anemia  Gastrointestinal: No hematochezia or melena,  No gastroesophageal reflux, no trouble swallowing  Urinary: [ ]  chronic Kidney disease, [ ]  on HD - [ ]  MWF or [ ]  TTHS, [ ]  Burning with urination, [ ]  Frequent urination, [ ]  Difficulty urinating;    Skin: No rashes  Psychological: No history of anxiety,  No history of depression   Physical Examination     Filed Vitals:   04/24/14 1501  BP: 159/99  Pulse: 81  Height: 5\' 1"  (1.549 m)  Weight: 132 lb (59.875 kg)  SpO2: 100%    General:  Alert and oriented, no acute distress HEENT: Normal Neck: No bruit or JVD Pulmonary: Clear to auscultation bilaterally Cardiac: Regular Rate and Rhythm without murmur Abdomen: Soft, non-tender, non-distended, no mass Skin: No rash, well-healed left first toe indication, no ulceration no purulent drainage no erythema right foot or toe  Extremity Pulses:  2+ radial, brachial, femoral, dorsalis pedis, posterior tibial pulses bilaterally Musculoskeletal: No deformity or edema     Neurologic: Upper and lower extremity motor 5/5 and symmetric   ASSESSMENT: Pain right first toe probably has small vessel disease as similar on the left side no indication for surgical intervention currently.   PLAN:  Patient will return for followup with duplex ultrasound in 3 months time. If she has continued deterioration would consider repeating her arteriogram.  , MD [facility] [place] Office: [phone] Pager: [phone]

## 2014-07-10 ENCOUNTER — Ambulatory Visit: Payer: Self-pay | Admitting: Podiatry

## 2014-07-15 ENCOUNTER — Telehealth: Payer: Self-pay

## 2014-07-15 ENCOUNTER — Encounter: Payer: Self-pay | Admitting: Vascular Surgery

## 2014-07-15 NOTE — Telephone Encounter (Signed)
Phone call from pt.  Reported continued right great toe pain that has worsened over past month.  Stated she thought she had an ingrown toenail, and upon closer look, noted she had a pus-like drainage from sore of right great toe.  Reported an intermittent stabbing pain in right great toe.  C/o numbness in arch of feet, bilaterally.  Stated she thinks this has been more noticeable over past month.  Stated she has a f/u appt. in October.  Rescheduled f/u appt. to  07/15/14 @ 1:30 PM for ABI's and Bilat. LE Art. Duplex, and appt. with Dr. Darrick Penna.  Agrees with plan.

## 2014-07-16 ENCOUNTER — Ambulatory Visit (INDEPENDENT_AMBULATORY_CARE_PROVIDER_SITE_OTHER)
Admission: RE | Admit: 2014-07-16 | Discharge: 2014-07-16 | Disposition: A | Discharge status: Home / Self Care | Payer: BC Managed Care – PPO | Source: Ambulatory Visit | Attending: Vascular Surgery | Admitting: Vascular Surgery

## 2014-07-16 ENCOUNTER — Other Ambulatory Visit: Payer: Self-pay | Admitting: Vascular Surgery

## 2014-07-16 ENCOUNTER — Ambulatory Visit (HOSPITAL_COMMUNITY)
Admission: RE | Admit: 2014-07-16 | Discharge: 2014-07-16 | Disposition: A | Discharge status: Home / Self Care | Payer: BC Managed Care – PPO | Source: Ambulatory Visit | Attending: Vascular Surgery | Admitting: Vascular Surgery

## 2014-07-16 ENCOUNTER — Encounter: Payer: Self-pay | Admitting: Vascular Surgery

## 2014-07-16 ENCOUNTER — Ambulatory Visit (INDEPENDENT_AMBULATORY_CARE_PROVIDER_SITE_OTHER): Payer: BC Managed Care – PPO | Admitting: Vascular Surgery

## 2014-07-16 VITALS — BP 120/76 | HR 84 | Temp 98.1°F | Resp 16 | Ht 61.0 in | Wt 140.0 lb

## 2014-07-16 DIAGNOSIS — IMO0002 Reserved for concepts with insufficient information to code with codable children: Secondary | ICD-10-CM

## 2014-07-16 DIAGNOSIS — I739 Peripheral vascular disease, unspecified: Secondary | ICD-10-CM

## 2014-07-16 DIAGNOSIS — M79604 Pain in right leg: Secondary | ICD-10-CM

## 2014-07-16 DIAGNOSIS — E119 Type 2 diabetes mellitus without complications: Secondary | ICD-10-CM | POA: Diagnosis not present

## 2014-07-16 DIAGNOSIS — M79609 Pain in unspecified limb: Secondary | ICD-10-CM | POA: Insufficient documentation

## 2014-07-16 DIAGNOSIS — M79674 Pain in right toe(s): Secondary | ICD-10-CM

## 2014-07-16 DIAGNOSIS — L98499 Non-pressure chronic ulcer of skin of other sites with unspecified severity: Secondary | ICD-10-CM

## 2014-07-16 DIAGNOSIS — S91101S Unspecified open wound of right great toe without damage to nail, sequela: Secondary | ICD-10-CM

## 2014-07-16 NOTE — Progress Notes (Signed)
[facility] [place] HISTORY AND PHYSICAL    History of Present Illness:  Patient is a 53 y.o. year old female who presents for pain in the right first toe. She was also seen for this 2 months ago.  She states that the toe is chronically painful. He has occasional drainage from the toe nail bed.  She takes Vicodin for the pain. 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 The patient has a long-standing history of multiple rheumatologic disorders. These include Raynaud's, arthritis, polymyositis. She also has a history of diabetes. All these are currently controlled. Other medical problems include fibromyalgia.    Past Medical History    Diagnosis   Date    .   Osteoarthritis       .   Polymyositis       .   Diabetes mellitus       .   Fibromyalgia       .   Osteopenia       .   Raynaud phenomenon       .   Heart disease       .   Polymyositis           Past Surgical History    Procedure   Laterality   Date    .   Cesarean section                x3    .   Tubal ligation          .   Hysteroscopy                w/ D&C with excision of endometrial polyps    .   Left great toenail removal    Left   2013          done by [facility]    .   Tonsillectomy   Bilateral          Social History History    Substance Use Topics    .   Smoking status:   Never Smoker     .   Smokeless tobacco:   Never Used    .   Alcohol Use:   Yes             Comment: occasional use only      Family History Family History    Problem   Relation   Age of Onset    .   Diabetes   Mother       .   Hypertension   Mother       .   Diabetes   Father       .   Heart disease   Father       .   Hypertension   Sister       .   Heart disease   Sister         Allergies  No Known Allergies    Current Outpatient Prescriptions on File Prior to Visit  Medication Sig Dispense Refill  . AMITRIPTYLINE HCL PO Take 25 mg by mouth at bedtime.      .  collagenase (SANTYL) ointment Apply topically daily. Apply to right first toe once daily  15 g  1  . methylPREDNISolone (MEDROL) 4 MG tablet Take 4 mg by mouth daily.      . mupirocin ointment (BACTROBAN) 2 % Apply 1 application topically 2 (two) times   daily as needed (for bed sores).       Marland Kitchen NIFEdipine (PROCARDIA-XL/ADALAT CC) 30 MG 24 hr tablet Take 30 mg by mouth daily.      Marland Kitchen omeprazole (PRILOSEC) 40 MG capsule Take 40 mg by mouth every morning.       Marland Kitchen alendronate (FOSAMAX) 70 MG tablet Take 70 mg by mouth every 7 (seven) days. Take with a full glass of water on an empty stomach. Takes on Monday.      Marland Kitchen aspirin EC 325 MG tablet Take 325 mg by mouth daily.      Marland Kitchen buPROPion (WELLBUTRIN XL) 150 MG 24 hr tablet Take 150 mg by mouth daily.      Marland Kitchen docusate sodium (COLACE) 100 MG capsule Take 100 mg by mouth daily.      Marland Kitchen escitalopram (LEXAPRO) 10 MG tablet daily.      . ferrous sulfate 325 (65 FE) MG EC tablet Take 325 mg by mouth daily with breakfast.      . Fluconazole (DIFLUCAN PO) Take by mouth.      . furosemide (LASIX) 20 MG tablet Take 1 tablet (20 mg total) by mouth daily.  7 tablet  0  . HYDROcodone-acetaminophen (NORCO/VICODIN) 5-325 MG per tablet Take 1-2 tablets by mouth every 6 (six) hours as needed for pain.      . mycophenolate (CELLCEPT) 500 MG tablet       . MYFORTIC 180 MG EC tablet Take 1 tablet by mouth 2 (two) times daily.      . nebivolol (BYSTOLIC) 2.5 MG tablet Take 1 tablet (2.5 mg total) by mouth daily.  30 tablet  2   No current facility-administered medications on file prior to visit.   ROS:    General:  No weight loss, Fever, chills  HEENT: No recent headaches, no nasal bleeding, no visual changes, no sore throat  Neurologic: No dizziness, blackouts, seizures. No recent symptoms of stroke or mini- stroke. No recent episodes of slurred speech, or temporary blindness.  Cardiac: No recent episodes of chest pain/pressure, no shortness of breath at rest.  No  shortness of breath with exertion.  Denies history of atrial fibrillation or irregular heartbeat  Vascular: + history of rest pain in feet.  No history of claudication.  + history of non-healing ulcer, No history of DVT    Pulmonary: No home oxygen, no productive cough, no hemoptysis,  No asthma or wheezing  Musculoskeletal:  [x ] Arthritis, [ ]  Low back pain,  [ ]  Joint pain  Hematologic:No history of hypercoagulable state.  No history of easy bleeding.  No history of anemia  Gastrointestinal: No hematochezia or melena,  No gastroesophageal reflux, no trouble swallowing  Urinary: [ ]  chronic Kidney disease, [ ]  on HD - [ ]  MWF or [ ]  TTHS, [ ]  Burning with urination, [ ]  Frequent urination, [ ]  Difficulty urinating;    Skin: No rashes  Psychological: No history of anxiety,  No history of depression   Physical Examination       Filed Vitals:   07/16/14 1504  BP: 120/76  Pulse: 84  Temp: 98.1 F (36.7 C)  TempSrc: Oral  Resp: 16  Height: 5\' 1"  (1.549 m)  Weight: 140 lb (63.504 kg)  SpO2: 98%   General:  Alert and oriented, no acute distress HEENT: Normal Neck: No bruit or JVD Pulmonary: Clear to auscultation bilaterally Cardiac: Regular Rate and Rhythm without murmur Abdomen: Soft, non-tender, non-distended, no mass Skin: No rash, well-healed  left first toe indication, no ulceration no purulent drainage no erythema right foot or toe, all toes are dusky at the tip bilaterally   Extremity Pulses:  2+ radial, brachial, femoral, dorsalis pedis, posterior tibial pulses bilaterally Musculoskeletal: No deformity or edema     Neurologic: Upper and lower extremity motor 5/5 and symmetric   ASSESSMENT: Pain right first toe probably has small vessel disease as similar on the left side requiring narcotic medication for pain control. Her duplex ultrasound today shows no significant large vessel arterial occlusive disease. However, she certainly still has pain in her right first toe. I  believe she needs a repeat arteriogram to see if there is any large vessel occlusive disease that is reconstructable. Otherwise we will need to consider amputation of her right first toe for pain control.  PLAN:  Aortogram of bilateral lower extremity runoff possible intervention on October 10. Risks benefits possible complications and procedure details were trying to the patient today including but limited to bleeding infection contrast reaction.  Fabienne Bruns, MD [facility] [place] Office: [phone] Pager: [phone]

## 2014-07-18 ENCOUNTER — Other Ambulatory Visit: Payer: Self-pay

## 2014-07-21 ENCOUNTER — Encounter (HOSPITAL_COMMUNITY): Payer: Self-pay | Admitting: Pharmacy Technician

## 2014-07-24 MED ORDER — SODIUM CHLORIDE 0.9 % IV SOLN
INTRAVENOUS | Status: DC
  Administered 2014-07-25: 07:00:00 via INTRAVENOUS

## 2014-07-25 ENCOUNTER — Ambulatory Visit (HOSPITAL_COMMUNITY)
Admission: RE | Admit: 2014-07-25 | Discharge: 2014-07-25 | Disposition: A | Discharge status: Home / Self Care | Payer: BC Managed Care – PPO | Source: Ambulatory Visit | Attending: Vascular Surgery | Admitting: Vascular Surgery

## 2014-07-25 ENCOUNTER — Encounter (HOSPITAL_COMMUNITY)
Admission: RE | Disposition: A | Discharge status: Home / Self Care | Payer: Self-pay | Source: Ambulatory Visit | Attending: Vascular Surgery

## 2014-07-25 ENCOUNTER — Telehealth: Payer: Self-pay | Admitting: Vascular Surgery

## 2014-07-25 DIAGNOSIS — Z7952 Long term (current) use of systemic steroids: Secondary | ICD-10-CM | POA: Insufficient documentation

## 2014-07-25 DIAGNOSIS — M8580 Other specified disorders of bone density and structure, unspecified site: Secondary | ICD-10-CM | POA: Diagnosis not present

## 2014-07-25 DIAGNOSIS — Z79891 Long term (current) use of opiate analgesic: Secondary | ICD-10-CM | POA: Insufficient documentation

## 2014-07-25 DIAGNOSIS — I7300 Raynaud's syndrome without gangrene: Secondary | ICD-10-CM | POA: Diagnosis not present

## 2014-07-25 DIAGNOSIS — M3320 Polymyositis, organ involvement unspecified: Secondary | ICD-10-CM | POA: Insufficient documentation

## 2014-07-25 DIAGNOSIS — I739 Peripheral vascular disease, unspecified: Secondary | ICD-10-CM | POA: Diagnosis present

## 2014-07-25 DIAGNOSIS — I70235 Atherosclerosis of native arteries of right leg with ulceration of other part of foot: Secondary | ICD-10-CM | POA: Diagnosis not present

## 2014-07-25 DIAGNOSIS — I70293 Other atherosclerosis of native arteries of extremities, bilateral legs: Secondary | ICD-10-CM | POA: Insufficient documentation

## 2014-07-25 DIAGNOSIS — M1990 Unspecified osteoarthritis, unspecified site: Secondary | ICD-10-CM | POA: Insufficient documentation

## 2014-07-25 DIAGNOSIS — L97519 Non-pressure chronic ulcer of other part of right foot with unspecified severity: Secondary | ICD-10-CM | POA: Diagnosis not present

## 2014-07-25 DIAGNOSIS — E119 Type 2 diabetes mellitus without complications: Secondary | ICD-10-CM | POA: Diagnosis not present

## 2014-07-25 DIAGNOSIS — M797 Fibromyalgia: Secondary | ICD-10-CM | POA: Diagnosis not present

## 2014-07-25 DIAGNOSIS — I70234 Atherosclerosis of native arteries of right leg with ulceration of heel and midfoot: Secondary | ICD-10-CM

## 2014-07-25 DIAGNOSIS — Z7982 Long term (current) use of aspirin: Secondary | ICD-10-CM | POA: Diagnosis not present

## 2014-07-25 HISTORY — PX: ABDOMINAL AORTAGRAM: SHX5454

## 2014-07-25 LAB — POCT I-STAT, CHEM 8
BUN: 16 mg/dL (ref 6–23)
CHLORIDE: 110 meq/L (ref 96–112)
Calcium, Ion: 1.16 mmol/L (ref 1.12–1.23)
Creatinine, Ser: 0.7 mg/dL (ref 0.50–1.10)
Glucose, Bld: 84 mg/dL (ref 70–99)
HEMATOCRIT: 38 % (ref 36.0–46.0)
Hemoglobin: 12.9 g/dL (ref 12.0–15.0)
POTASSIUM: 4.2 meq/L (ref 3.7–5.3)
SODIUM: 144 meq/L (ref 137–147)
TCO2: 24 mmol/L (ref 0–100)

## 2014-07-25 LAB — GLUCOSE, CAPILLARY: Glucose-Capillary: 81 mg/dL (ref 70–99)

## 2014-07-25 SURGERY — ABDOMINAL AORTAGRAM
Anesthesia: LOCAL

## 2014-07-25 MED ORDER — MORPHINE SULFATE 10 MG/ML IJ SOLN
2.0000 mg | INTRAMUSCULAR | Status: DC | PRN
  Administered 2014-07-25: 2 mg via INTRAVENOUS

## 2014-07-25 MED ORDER — HEPARIN (PORCINE) IN NACL 2-0.9 UNIT/ML-% IJ SOLN
INTRAMUSCULAR | Status: AC
  Filled 2014-07-25: qty 1000

## 2014-07-25 MED ORDER — MORPHINE SULFATE 2 MG/ML IJ SOLN
INTRAMUSCULAR | Status: AC
  Filled 2014-07-25: qty 1

## 2014-07-25 MED ORDER — SODIUM CHLORIDE 0.45 % IV SOLN
INTRAVENOUS | Status: DC
  Administered 2014-07-25: 10:00:00 via INTRAVENOUS

## 2014-07-25 MED ORDER — ACETAMINOPHEN 325 MG RE SUPP
325.0000 mg | RECTAL | Status: DC | PRN
  Filled 2014-07-25: qty 2

## 2014-07-25 MED ORDER — LIDOCAINE HCL (PF) 1 % IJ SOLN
INTRAMUSCULAR | Status: AC
  Filled 2014-07-25: qty 30

## 2014-07-25 MED ORDER — ACETAMINOPHEN 325 MG PO TABS
325.0000 mg | ORAL_TABLET | ORAL | Status: DC | PRN
  Filled 2014-07-25: qty 2

## 2014-07-25 MED ORDER — OXYCODONE HCL 5 MG PO TABS
5.0000 mg | ORAL_TABLET | ORAL | Status: DC | PRN
  Filled 2014-07-25: qty 2

## 2014-07-25 MED ORDER — ONDANSETRON HCL 4 MG/2ML IJ SOLN: 4.0000 mg | Freq: Four times a day (QID) | INTRAMUSCULAR | Status: DC | PRN

## 2014-07-25 MED ORDER — LABETALOL HCL 5 MG/ML IV SOLN: 10.0000 mg | INTRAVENOUS | Status: DC | PRN

## 2014-07-25 MED ORDER — HYDRALAZINE HCL 20 MG/ML IJ SOLN: 10.0000 mg | INTRAMUSCULAR | Status: DC | PRN

## 2014-07-25 NOTE — Interval H&P Note (Signed)
History and Physical Interval Note:  07/25/2014 8:00 AM  Allison Beasley  has presented today for surgery, with the diagnosis of pad  The various methods of treatment have been discussed with the patient and family. After consideration of risks, benefits and other options for treatment, the patient has consented to  Procedure(s): ABDOMINAL AORTAGRAM (N/A) as a surgical intervention .  The patient's history has been reviewed, patient examined, no change in status, stable for surgery.  I have reviewed the patient's chart and labs.  Questions were answered to the patient's satisfaction.     Ashna Dorough E

## 2014-07-25 NOTE — Progress Notes (Signed)
Site area: left groin Site Prior to Removal:  Level 0 Pressure Applied For: 20 minutes Manual:   yes Patient Status During Pull:  stable Post Pull Site:  Level0 Post Pull Instructions Given:  yes Post Pull Pulses Present: yes Dressing Applied:  tegaderm Bedrest begins @ 1040 Comments: no complications

## 2014-07-25 NOTE — Telephone Encounter (Signed)
Message copied by Fredrich Birks on Fri Jul 25, 2014  4:09 PM ------      Message from: Sharee Pimple      Created: Fri Jul 25, 2014  3:50 PM      Regarding: Schedule       FYI no appt needed            ----- Message -----         From: Sherren Kerns, MD         Sent: 07/25/2014  10:09 AM           To: [facility] Charge Pool            Aortogram with bilat runoff      2nd order right iliac             Ultrasound      Left groin puncture            Pt does not need follow up appt      She will call if she wants toe amputation            Fabienne Bruns       ------

## 2014-07-25 NOTE — Progress Notes (Signed)
Left femoral arterial sheath removed by Teola Bradley

## 2014-07-25 NOTE — H&P (View-Only) (Signed)
[facility] [place] HISTORY AND PHYSICAL    History of Present Illness:  Patient is a 53 y.o. year old female who presents for pain in the right first toe. She was also seen for this 2 months ago.  She states that the toe is chronically painful. He has occasional drainage from the toe nail bed.  She takes Vicodin for the pain. 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 The patient has a long-standing history of multiple rheumatologic disorders. These include Raynaud's, arthritis, polymyositis. She also has a history of diabetes. All these are currently controlled. Other medical problems include fibromyalgia.    Past Medical History    Diagnosis   Date    .   Osteoarthritis       .   Polymyositis       .   Diabetes mellitus       .   Fibromyalgia       .   Osteopenia       .   Raynaud phenomenon       .   Heart disease       .   Polymyositis           Past Surgical History    Procedure   Laterality   Date    .   Cesarean section                x3    .   Tubal ligation          .   Hysteroscopy                w/ D&C with excision of endometrial polyps    .   Left great toenail removal    Left   2013          done by [facility]    .   Tonsillectomy   Bilateral          Social History History    Substance Use Topics    .   Smoking status:   Never Smoker     .   Smokeless tobacco:   Never Used    .   Alcohol Use:   Yes             Comment: occasional use only      Family History Family History    Problem   Relation   Age of Onset    .   Diabetes   Mother       .   Hypertension   Mother       .   Diabetes   Father       .   Heart disease   Father       .   Hypertension   Sister       .   Heart disease   Sister         Allergies  No Known Allergies    Current Outpatient Prescriptions on File Prior to Visit  Medication Sig Dispense Refill  . AMITRIPTYLINE HCL PO Take 25 mg by mouth at bedtime.      .  collagenase (SANTYL) ointment Apply topically daily. Apply to right first toe once daily  15 g  1  . methylPREDNISolone (MEDROL) 4 MG tablet Take 4 mg by mouth daily.      . mupirocin ointment (BACTROBAN) 2 % Apply 1 application topically 2 (two) times  daily as needed (for bed sores).       Marland Kitchen NIFEdipine (PROCARDIA-XL/ADALAT CC) 30 MG 24 hr tablet Take 30 mg by mouth daily.      Marland Kitchen omeprazole (PRILOSEC) 40 MG capsule Take 40 mg by mouth every morning.       Marland Kitchen alendronate (FOSAMAX) 70 MG tablet Take 70 mg by mouth every 7 (seven) days. Take with a full glass of water on an empty stomach. Takes on Monday.      Marland Kitchen aspirin EC 325 MG tablet Take 325 mg by mouth daily.      Marland Kitchen buPROPion (WELLBUTRIN XL) 150 MG 24 hr tablet Take 150 mg by mouth daily.      Marland Kitchen docusate sodium (COLACE) 100 MG capsule Take 100 mg by mouth daily.      Marland Kitchen escitalopram (LEXAPRO) 10 MG tablet daily.      . ferrous sulfate 325 (65 FE) MG EC tablet Take 325 mg by mouth daily with breakfast.      . Fluconazole (DIFLUCAN PO) Take by mouth.      . furosemide (LASIX) 20 MG tablet Take 1 tablet (20 mg total) by mouth daily.  7 tablet  0  . HYDROcodone-acetaminophen (NORCO/VICODIN) 5-325 MG per tablet Take 1-2 tablets by mouth every 6 (six) hours as needed for pain.      . mycophenolate (CELLCEPT) 500 MG tablet       . MYFORTIC 180 MG EC tablet Take 1 tablet by mouth 2 (two) times daily.      . nebivolol (BYSTOLIC) 2.5 MG tablet Take 1 tablet (2.5 mg total) by mouth daily.  30 tablet  2   No current facility-administered medications on file prior to visit.   ROS:    General:  No weight loss, Fever, chills  HEENT: No recent headaches, no nasal bleeding, no visual changes, no sore throat  Neurologic: No dizziness, blackouts, seizures. No recent symptoms of stroke or mini- stroke. No recent episodes of slurred speech, or temporary blindness.  Cardiac: No recent episodes of chest pain/pressure, no shortness of breath at rest.  No  shortness of breath with exertion.  Denies history of atrial fibrillation or irregular heartbeat  Vascular: + history of rest pain in feet.  No history of claudication.  + history of non-healing ulcer, No history of DVT    Pulmonary: No home oxygen, no productive cough, no hemoptysis,  No asthma or wheezing  Musculoskeletal:  [x ] Arthritis, [ ]  Low back pain,  [ ]  Joint pain  Hematologic:No history of hypercoagulable state.  No history of easy bleeding.  No history of anemia  Gastrointestinal: No hematochezia or melena,  No gastroesophageal reflux, no trouble swallowing  Urinary: [ ]  chronic Kidney disease, [ ]  on HD - [ ]  MWF or [ ]  TTHS, [ ]  Burning with urination, [ ]  Frequent urination, [ ]  Difficulty urinating;    Skin: No rashes  Psychological: No history of anxiety,  No history of depression   Physical Examination       Filed Vitals:   07/16/14 1504  BP: 120/76  Pulse: 84  Temp: 98.1 F (36.7 C)  TempSrc: Oral  Resp: 16  Height: 5\' 1"  (1.549 m)  Weight: 140 lb (63.504 kg)  SpO2: 98%   General:  Alert and oriented, no acute distress HEENT: Normal Neck: No bruit or JVD Pulmonary: Clear to auscultation bilaterally Cardiac: Regular Rate and Rhythm without murmur Abdomen: Soft, non-tender, non-distended, no mass Skin: No rash, well-healed  left first toe indication, no ulceration no purulent drainage no erythema right foot or toe, all toes are dusky at the tip bilaterally   Extremity Pulses:  2+ radial, brachial, femoral, dorsalis pedis, posterior tibial pulses bilaterally Musculoskeletal: No deformity or edema     Neurologic: Upper and lower extremity motor 5/5 and symmetric   ASSESSMENT: Pain right first toe probably has small vessel disease as similar on the left side requiring narcotic medication for pain control. Her duplex ultrasound today shows no significant large vessel arterial occlusive disease. However, she certainly still has pain in her right first toe. I  believe she needs a repeat arteriogram to see if there is any large vessel occlusive disease that is reconstructable. Otherwise we will need to consider amputation of her right first toe for pain control.  PLAN:  Aortogram of bilateral lower extremity runoff possible intervention on October 10. Risks benefits possible complications and procedure details were trying to the patient today including but limited to bleeding infection contrast reaction.  Fabienne Bruns, MD [facility] [place] Office: [phone] Pager: [phone]

## 2014-07-25 NOTE — Discharge Instructions (Signed)
NO METFORMIN FOR 2 DAYS ° ° ° ° ° °Angiogram, Care After °Refer to this sheet in the next few weeks. These instructions provide you with information on caring for yourself after your procedure. Your health care provider may also give you more specific instructions. Your treatment has been planned according to current medical practices, but problems sometimes occur. Call your health care provider if you have any problems or questions after your procedure.  °WHAT TO EXPECT AFTER THE PROCEDURE °After your procedure, it is typical to have the following sensations: °· Minor discomfort or tenderness and a small bump at the catheter insertion site. The bump should usually decrease in size and tenderness within 1 to 2 weeks. °· Any bruising will usually fade within 2 to 4 weeks. °HOME CARE INSTRUCTIONS  °· You may need to keep taking blood thinners if they were prescribed for you. Take medicines only as directed by your health care provider. °· Do not apply powder or lotion to the site. °· Do not take baths, swim, or use a hot tub until your health care provider approves. °· You may shower 24 hours after the procedure. Remove the bandage (dressing) and gently wash the site with plain soap and water. Gently pat the site dry. °· Inspect the site at least twice daily. °· Limit your activity for the first 48 hours. Do not bend, squat, or lift anything over 20 lb (9 kg) or as directed by your health care provider. °· Plan to have someone take you home after the procedure. Follow instructions about when you can drive or return to work. °SEEK MEDICAL CARE IF: °· You get light-headed when standing up. °· You have drainage (other than a small amount of blood on the dressing). °· You have chills. °· You have a fever. °· You have redness, warmth, swelling, or pain at the insertion site. °SEEK IMMEDIATE MEDICAL CARE IF:  °· You develop chest pain or shortness of breath, feel faint, or pass out. °· You have bleeding, swelling larger  than a walnut, or drainage from the catheter insertion site. °· You develop pain, discoloration, coldness, or severe bruising in the leg or arm that held the catheter. °· You develop bleeding from any other place, such as the bowels. You may see bright red blood in your urine or stools, or your stools may appear black and tarry. °· You have heavy bleeding from the site. If this happens, hold pressure on the site. °MAKE SURE YOU: °· Understand these instructions. °· Will watch your condition. °· Will get help right away if you are not doing well or get worse. °Document Released: 04/28/2005 Document Revised: 02/24/2014 Document Reviewed: 03/04/2013 °ExitCare® Patient Information ©2015 [employer]. This information is not intended to replace advice given to you by your health care provider. Make sure you discuss any questions you have with your health care provider. ° °

## 2014-07-25 NOTE — Op Note (Signed)
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 The patient was placed in supine position the Angio table. Both groins were prepped and draped in usual sterile fashion. Local anesthesia was infiltrated over the left common femoral artery. Ultrasound was used to identify the left common femoral artery area an introducer needle was then used to cannulate the left common femoral artery and a micropuncture wire placed through the needle into the left iliac system. The micropuncture sheath was then placed over this. The micropuncture wire was removed and replaced with an 035 Versacore wire. This was advanced up into the abdominal aorta under fluoroscopic guidance. And all aortogram was then obtained in the AP projection. The left and right renal arteries are patent. The infrarenal abdominal aorta is patent. The left and right common external and internal iliac arteries are all widely patent. Next the pigtail catheter was pulled down just above the aortic bifurcation and lower extremity runoff views were performed.  In the left lower extremity, the left common femoral profunda femoris artery and superficial femoral arteries are patent. There is a mid 25% stenosis of the left superficial femoral artery. The left popliteal artery is patent. The proximal portion of the anterior tibial posterior tibial and peroneal arteries are all patent. There is diminished flow at the level of the ankle down to the left foot.    In the right lower extremity, there are similar findings. Since the patient had complains primarily of right foot problems, I decided to advance the catheter up into the right iliac system to get better  opacification of the distal tibial vessels in the right leg. A 5 French pigtail catheter was exchanged over a guidewire for a 5 [place] crossover catheter. This was used to selectively catheterize the right common iliac artery. The 035 versacore wire was then advanced into the distal right external iliac artery. The cross of the catheter was removed over the guidewire replaced with a 5 French straight catheter. Right lower extremity runoff views were then repeated in the right leg.  In the right lower extremity the anterior tibial artery peroneal artery and posterior tibial arteries are all patent proximally. However the anterior tibial artery occludes at the level of the ankle. The peroneal and posterior tibial arteries are patent but quite small. There is filling of the lateral plantar artery and the posterior aspect of the foot. There is no complete plantar arch. The digital vessels are never really well visualized. At this point the 5 French straight catheter was removed over a guidewire the 5 [place] sheath was thoroughly flushed with heparinized saline. A 5 French sheath was left in place to be pulled in the holding area. The patient tolerated the procedure well and there were no complications. The patient was taken to the holding area in stable condition.  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 I have offered the patient an amputation of her toe for pain control if she wishes at some point future. She will call if she wishes to have this done at some point.  Fabienne Bruns, MD [facility] Office: [phone] Pager: [phone]

## 2014-08-06 ENCOUNTER — Other Ambulatory Visit: Payer: Self-pay

## 2014-08-07 ENCOUNTER — Ambulatory Visit: Payer: Medicare Other | Admitting: Vascular Surgery

## 2014-08-07 ENCOUNTER — Encounter (HOSPITAL_COMMUNITY): Payer: Medicare Other

## 2014-08-07 ENCOUNTER — Other Ambulatory Visit (HOSPITAL_COMMUNITY): Payer: Medicare Other

## 2014-08-25 ENCOUNTER — Encounter: Payer: Self-pay | Admitting: Vascular Surgery

## 2014-08-28 ENCOUNTER — Other Ambulatory Visit: Payer: Self-pay | Admitting: Internal Medicine

## 2014-08-28 DIAGNOSIS — N644 Mastodynia: Secondary | ICD-10-CM

## 2014-08-28 DIAGNOSIS — N6489 Other specified disorders of breast: Secondary | ICD-10-CM

## 2014-09-10 ENCOUNTER — Ambulatory Visit
Admission: RE | Admit: 2014-09-10 | Discharge: 2014-09-10 | Disposition: A | Discharge status: Home / Self Care | Payer: BC Managed Care – PPO | Source: Ambulatory Visit | Attending: Internal Medicine | Admitting: Internal Medicine

## 2014-09-10 DIAGNOSIS — N644 Mastodynia: Secondary | ICD-10-CM

## 2014-09-10 DIAGNOSIS — N6489 Other specified disorders of breast: Secondary | ICD-10-CM

## 2014-10-02 ENCOUNTER — Encounter (HOSPITAL_COMMUNITY): Payer: Self-pay | Admitting: Vascular Surgery

## 2015-04-24 ENCOUNTER — Telehealth: Payer: Self-pay

## 2015-04-24 NOTE — Telephone Encounter (Signed)
Phone call from pt.  Reported increased pain in right great toe, with redness and inflammation in the great toe and lateral foot.  Reported fever last night of 100.2.  Hasn't checked temperature today.  Stated she saw a Podiatrist approx 10 days ago in [facility], and was started on an antibiotic, and referred to the [facility].  Stated her appt. @ the [facility] is on 05/11/15.  Reported she has 3 blisters on the right great toe.  Reported she is unable to sleep at night, due to the pain.  Reported her last dose of Cephalexin will be tomorrow.  Related hx of Polymositis, and ran out of her prednisone, and is unable to get this refilled, since her PCP has left the practice.   Discussed with Dr. Bridgett Larsson in office today.  Recommended pt. go to the ER for evaluation of symptoms, and possible need for IV antibiotics.  Call ret'd to pt.  Advised to go to the ER for evaluation of right great toe/ foot today.  Verb. Understanding.

## 2015-04-26 ENCOUNTER — Encounter (HOSPITAL_COMMUNITY): Payer: Self-pay | Admitting: Adult Health

## 2015-04-26 ENCOUNTER — Emergency Department (HOSPITAL_COMMUNITY)
Admission: EM | Admit: 2015-04-26 | Discharge: 2015-04-26 | Disposition: A | Discharge status: Home / Self Care | Payer: BLUE CROSS/BLUE SHIELD | Attending: Emergency Medicine | Admitting: Emergency Medicine

## 2015-04-26 ENCOUNTER — Emergency Department (HOSPITAL_COMMUNITY): Payer: BLUE CROSS/BLUE SHIELD

## 2015-04-26 DIAGNOSIS — K219 Gastro-esophageal reflux disease without esophagitis: Secondary | ICD-10-CM | POA: Diagnosis not present

## 2015-04-26 DIAGNOSIS — L089 Local infection of the skin and subcutaneous tissue, unspecified: Secondary | ICD-10-CM | POA: Diagnosis present

## 2015-04-26 DIAGNOSIS — Z862 Personal history of diseases of the blood and blood-forming organs and certain disorders involving the immune mechanism: Secondary | ICD-10-CM | POA: Diagnosis not present

## 2015-04-26 DIAGNOSIS — L97519 Non-pressure chronic ulcer of other part of right foot with unspecified severity: Secondary | ICD-10-CM | POA: Insufficient documentation

## 2015-04-26 DIAGNOSIS — Z8679 Personal history of other diseases of the circulatory system: Secondary | ICD-10-CM | POA: Insufficient documentation

## 2015-04-26 DIAGNOSIS — Z7952 Long term (current) use of systemic steroids: Secondary | ICD-10-CM | POA: Insufficient documentation

## 2015-04-26 DIAGNOSIS — Z79899 Other long term (current) drug therapy: Secondary | ICD-10-CM | POA: Diagnosis not present

## 2015-04-26 DIAGNOSIS — M1990 Unspecified osteoarthritis, unspecified site: Secondary | ICD-10-CM | POA: Diagnosis not present

## 2015-04-26 DIAGNOSIS — E11621 Type 2 diabetes mellitus with foot ulcer: Secondary | ICD-10-CM | POA: Diagnosis not present

## 2015-04-26 LAB — CBC WITH DIFFERENTIAL/PLATELET
BASOS PCT: 0 % (ref 0–1)
Basophils Absolute: 0 10*3/uL (ref 0.0–0.1)
EOS PCT: 0 % (ref 0–5)
Eosinophils Absolute: 0 10*3/uL (ref 0.0–0.7)
HEMATOCRIT: 37.1 % (ref 36.0–46.0)
HEMOGLOBIN: 12.1 g/dL (ref 12.0–15.0)
LYMPHS ABS: 0.8 10*3/uL (ref 0.7–4.0)
Lymphocytes Relative: 12 % (ref 12–46)
MCH: 29.1 pg (ref 26.0–34.0)
MCHC: 32.6 g/dL (ref 30.0–36.0)
MCV: 89.2 fL (ref 78.0–100.0)
Monocytes Absolute: 0.1 10*3/uL (ref 0.1–1.0)
Monocytes Relative: 1 % — ABNORMAL LOW (ref 3–12)
NEUTROS ABS: 5.9 10*3/uL (ref 1.7–7.7)
Neutrophils Relative %: 87 % — ABNORMAL HIGH (ref 43–77)
Platelets: 261 10*3/uL (ref 150–400)
RBC: 4.16 MIL/uL (ref 3.87–5.11)
RDW: 15.1 % (ref 11.5–15.5)
WBC: 6.8 10*3/uL (ref 4.0–10.5)

## 2015-04-26 LAB — BASIC METABOLIC PANEL WITH GFR
Anion gap: 10 (ref 5–15)
BUN: 8 mg/dL (ref 6–20)
CO2: 22 mmol/L (ref 22–32)
Calcium: 8.4 mg/dL — ABNORMAL LOW (ref 8.9–10.3)
Chloride: 105 mmol/L (ref 101–111)
Creatinine, Ser: 0.72 mg/dL (ref 0.44–1.00)
GFR calc Af Amer: >60 mL/min
GFR calc non Af Amer: >60 mL/min
Glucose, Bld: 141 mg/dL — ABNORMAL HIGH (ref 65–99)
Potassium: 4.2 mmol/L (ref 3.5–5.1)
Sodium: 137 mmol/L (ref 135–145)

## 2015-04-26 LAB — SEDIMENTATION RATE: Sed Rate: 96 mm/h — ABNORMAL HIGH (ref 0–22)

## 2015-04-26 MED ORDER — MORPHINE SULFATE 4 MG/ML IJ SOLN
4.0000 mg | Freq: Once | INTRAMUSCULAR | Status: AC
  Administered 2015-04-26: 4 mg via INTRAVENOUS
  Filled 2015-04-26: qty 1

## 2015-04-26 MED ORDER — ONDANSETRON HCL 4 MG/2ML IJ SOLN
4.0000 mg | Freq: Once | INTRAMUSCULAR | Status: AC
  Administered 2015-04-26: 4 mg via INTRAVENOUS
  Filled 2015-04-26: qty 2

## 2015-04-26 MED ORDER — CLINDAMYCIN HCL 150 MG PO CAPS: 450.0000 mg | ORAL_CAPSULE | Freq: Three times a day (TID) | ORAL | Status: DC

## 2015-04-26 MED ORDER — CLINDAMYCIN PHOSPHATE 600 MG/50ML IV SOLN
600.0000 mg | Freq: Once | INTRAVENOUS | Status: AC
  Administered 2015-04-26: 600 mg via INTRAVENOUS
  Filled 2015-04-26: qty 50

## 2015-04-26 MED ORDER — OXYCODONE-ACETAMINOPHEN 5-325 MG PO TABS
1.0000 | ORAL_TABLET | Freq: Four times a day (QID) | ORAL | Status: DC | PRN
Start: 2015-04-26 — End: 2015-05-20

## 2015-04-26 MED ORDER — CIPROFLOXACIN HCL 750 MG PO TABS: 750.0000 mg | ORAL_TABLET | Freq: Two times a day (BID) | ORAL | Status: DC

## 2015-04-26 MED ORDER — CIPROFLOXACIN IN D5W 400 MG/200ML IV SOLN
400.0000 mg | Freq: Once | INTRAVENOUS | Status: AC
  Administered 2015-04-26: 400 mg via INTRAVENOUS
  Filled 2015-04-26: qty 200
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
## 2015-04-26 NOTE — ED Notes (Signed)
Acuity changed to 3 pr request of EDP

## 2015-04-26 NOTE — ED Provider Notes (Signed)
CSN: [id number]     Arrival date & time 04/26/15  1429 History  This chart was scribed for non-physician practitioner, Jaynie Crumble, PA-C working with Tilden Fossa, MD by Freida Busman, ED Scribe. This patient was seen in room TR09C/TR09C and the patient's care was started at 3:49 PM.   Chief Complaint  Patient presents with  . Wound Infection   The history is provided by the patient. No language interpreter was used.     HPI Comments:  Allison Beasley is a 54 y.o. female with a PMHx of DM and Raynaud phenomenon who presents to the Emergency Department for a wound check. Pt has a wound near her right great toe. She states it initally presented as a blister ~2 weeks ago, when it popped it turned into a wound. She reports moderate pain to the site. Pt is currently being followed by a podiatrist and a wound clinic. She recently ran out of her antibiotic. No modifying factors noted. No recent fever.   Past Medical History  Diagnosis Date  . Osteoarthritis   . Polymyositis   . Fibromyalgia   . Osteopenia   . Raynaud phenomenon   . Polymyositis   . Constipation   . Shortness of breath   . Diabetes mellitus without complication   . GERD (gastroesophageal reflux disease)   . Headache(784.0)   . Anemia    Past Surgical History  Procedure Laterality Date  . Cesarean section      x3  . Tubal ligation    . Hysteroscopy      w/ D&C with excision of endometrial polyps  . Left great toenail removal  Left 2013    done by [facility]  . Tonsillectomy Bilateral   . Amputation Left 12/24/2012    Procedure: AMPUTATION DIGIT left great toe;  Surgeon: Sherren Kerns, MD;  Location: [facility] OR;  Service: Vascular;  Laterality: Left;  GREAT  . Abdominal aortagram N/A 07/25/2014    Procedure: ABDOMINAL Ronny Flurry;  Surgeon: Sherren Kerns, MD;  [facility] CATH LAB;  Service: Cardiovascular;  Laterality: N/A;   Family History  Problem Relation Age of Onset  . Diabetes Mother   . Hypertension  Mother   . Diabetes Father   . Heart disease Father   . Hypertension Sister   . Heart disease Sister    History  Substance Use Topics  . Smoking status: Never Smoker   . Smokeless tobacco: Never Used  . Alcohol Use: No     Comment: occasional use only   OB History    Gravida Para Term Preterm AB TAB SAB Ectopic Multiple Living   4 3   1     3      Review of Systems  Constitutional: Positive for fever and chills.  Musculoskeletal: Positive for joint swelling and arthralgias.  Skin: Positive for color change and wound.  Neurological: Negative for weakness and numbness.      Allergies  Tape  Home Medications   Prior to Admission medications   Medication Sig Start Date End Date Taking? Authorizing Provider  amitriptyline (ELAVIL) 25 MG tablet Take 25 mg by mouth at bedtime as needed for sleep.    Historical Provider, MD  HYDROcodone-acetaminophen (NORCO/VICODIN) 5-325 MG per tablet Take 1 tablet by mouth daily as needed (pain).     Historical Provider, MD  MAGNESIUM GLUCONATE PO Take 400 mg by mouth daily.    Historical Provider, MD  Multiple Vitamins-Minerals (HAIR/SKIN/NAILS/BIOTIN) TABS Take 1 tablet by mouth  daily at 12 noon.    Historical Provider, MD  naproxen sodium (ALEVE) 220 MG tablet Take 440 mg by mouth at bedtime as needed (pain).    Historical Provider, MD  omeprazole (PRILOSEC) 40 MG capsule Take 40 mg by mouth daily.     Historical Provider, MD  Dola Argyle LANCETS 33G MISC  06/04/14   Historical Provider, MD  Letta Pate VERIO test strip  06/04/14   Historical Provider, MD  predniSONE (DELTASONE) 10 MG tablet Take 10 mg by mouth daily with breakfast.  05/28/14   Historical Provider, MD   BP 125/80 mmHg  Pulse 103  Temp(Src) 98.1 F (36.7 C) (Oral)  Resp 18  SpO2 100%  LMP 06/20/2012 Physical Exam  Constitutional: She is oriented to person, place, and time. She appears well-developed and well-nourished. No distress.  HENT:  Head: Normocephalic and  atraumatic.  Eyes: Conjunctivae are normal.  Neck: Normal range of motion.  Cardiovascular: Normal rate.   Pulmonary/Chest: Effort normal.  Musculoskeletal: Normal range of motion.  Swelling noted to the right great toe, there are 2 ulcerations over her medial aspect of the toe, no drainage. Wounds are dry. There is exquisitely tender, erythematous, warm. Pain with any range of motion in any joint of the toe. Normal foot.  Neurological: She is alert and oriented to person, place, and time.  Skin: Skin is warm and dry.  Nursing note and vitals reviewed.   ED Course  Procedures   DIAGNOSTIC STUDIES:  Oxygen Saturation is 100% on RA, normal by my interpretation.    COORDINATION OF CARE:  3:55 PM Discussed treatment plan with pt at bedside and pt agreed to plan.  Labs Review Labs Reviewed - No data to display  Imaging Review No results found.   EKG Interpretation None      MDM   Final diagnoses:  Diabetic ulcer of right great toe   patient with poorly healing ulcerations of the right great toe, now with worsening swelling of the toe, erythema, tenderness. Concerning for cellulitis. Patient on immunosuppressive medications, currently on prednisone. Patient is diabetic, also has history of ray not, polymyositis. She has history of left toe amputation for the same. Will get x-ray, labs. Discussed with Dr. Madilyn Hook who has seen patient as well, advised IV antibiotics, sedimentation rate.  Discussed with Dr. Madilyn Hook, will follow up on pt at shift change.   Filed Vitals:   04/26/15 1815 04/26/15 1830 04/26/15 1845 04/26/15 2018  BP: 124/75 121/76 112/73 115/73  Pulse: 90 90 89 90  Temp:    98.6 F (37 C)  TempSrc:    Oral  Resp:    16  SpO2: 99% 99% 98% 100%      Jaynie Crumble, PA-C 04/27/15 1633  Tilden Fossa, MD 04/27/15 [phone]

## 2015-04-26 NOTE — Discharge Instructions (Signed)
° °Cellulitis °Cellulitis is an infection of the skin and the tissue beneath it. The infected area is usually red and tender. Cellulitis occurs most often in the arms and lower legs.  °CAUSES  °Cellulitis is caused by bacteria that enter the skin through cracks or cuts in the skin. The most common types of bacteria that cause cellulitis are staphylococci and streptococci. °SIGNS AND SYMPTOMS  °· Redness and warmth. °· Swelling. °· Tenderness or pain. °· Fever. °DIAGNOSIS  °Your health care provider can usually determine what is wrong based on a physical exam. Blood tests may also be done. °TREATMENT  °Treatment usually involves taking an antibiotic medicine. °HOME CARE INSTRUCTIONS  °· Take your antibiotic medicine as directed by your health care provider. Finish the antibiotic even if you start to feel better. °· Keep the infected arm or leg elevated to reduce swelling. °· Apply a warm cloth to the affected area up to 4 times per day to relieve pain. °· Take medicines only as directed by your health care provider. °· Keep all follow-up visits as directed by your health care provider. °SEEK MEDICAL CARE IF:  °· You notice red streaks coming from the infected area. °· Your red area gets larger or turns dark in color. °· Your bone or joint underneath the infected area becomes painful after the skin has healed. °· Your infection returns in the same area or another area. °· You notice a swollen bump in the infected area. °· You develop new symptoms. °· You have a fever. °SEEK IMMEDIATE MEDICAL CARE IF:  °· You feel very sleepy. °· You develop vomiting or diarrhea. °· You have a general ill feeling (malaise) with muscle aches and pains. °MAKE SURE YOU:  °· Understand these instructions. °· Will watch your condition. °· Will get help right away if you are not doing well or get worse. °Document Released: 07/20/2005 Document Revised: 02/24/2014 Document Reviewed: 12/26/2011 °ExitCare® Patient Information ©2015 [employer]  LL[employer]. This information is not intended to replace advice given to you by your health care provider. Make sure you discuss any questions you have with your health care provider. ° °Diabetes and Foot Care °Diabetes may cause you to have problems because of poor blood supply (circulation) to your feet and legs. This may cause the skin on your feet to become thinner, break easier, and heal more slowly. Your skin may become dry, and the skin may peel and crack. You may also have nerve damage in your legs and feet causing decreased feeling in them. You may not notice minor injuries to your feet that could lead to infections or more serious problems. Taking care of your feet is one of the most important things you can do for yourself.  °HOME CARE INSTRUCTIONS °· Wear shoes at all times, even in the house. Do not go barefoot. Bare feet are easily injured. °· Check your feet daily for blisters, cuts, and redness. If you cannot see the bottom of your feet, use a mirror or ask someone for help. °· Wash your feet with warm water (do not use hot water) and mild soap. Then pat your feet and the areas between your toes until they are completely dry. Do not soak your feet as this can dry your skin. °· Apply a moisturizing lotion or petroleum jelly (that does not contain alcohol and is unscented) to the skin on your feet and to dry, brittle toenails. Do not apply lotion between your toes. °· Trim your toenails straight across.   Do not dig under them or around the cuticle. File the edges of your nails with an emery board or nail file. °· Do not cut corns or calluses or try to remove them with medicine. °· Wear clean socks or stockings every day. Make sure they are not too tight. Do not wear knee-high stockings since they may decrease blood flow to your legs. °· Wear shoes that fit properly and have enough cushioning. To break in new shoes, wear them for just a few hours a day. This prevents you from injuring your feet. Always look in  your shoes before you put them on to be sure there are no objects inside. °· Do not cross your legs. This may decrease the blood flow to your feet. °· If you find a minor scrape, cut, or break in the skin on your feet, keep it and the skin around it clean and dry. These areas may be cleansed with mild soap and water. Do not cleanse the area with peroxide, alcohol, or iodine. °· When you remove an adhesive bandage, be sure not to damage the skin around it. °· If you have a wound, look at it several times a day to make sure it is healing. °· Do not use heating pads or hot water bottles. They may burn your skin. If you have lost feeling in your feet or legs, you may not know it is happening until it is too late. °· Make sure your health care provider performs a complete foot exam at least annually or more often if you have foot problems. Report any cuts, sores, or bruises to your health care provider immediately. °SEEK MEDICAL CARE IF:  °· You have an injury that is not healing. °· You have cuts or breaks in the skin. °· You have an ingrown nail. °· You notice redness on your legs or feet. °· You feel burning or tingling in your legs or feet. °· You have pain or cramps in your legs and feet. °· Your legs or feet are numb. °· Your feet always feel cold. °SEEK IMMEDIATE MEDICAL CARE IF:  °· There is increasing redness, swelling, or pain in or around a wound. °· There is a red line that goes up your leg. °· Pus is coming from a wound. °· You develop a fever or as directed by your health care provider. °· You notice a bad smell coming from an ulcer or wound. °Document Released: 10/07/2000 Document Revised: 06/12/2013 Document Reviewed: 03/19/2013 °ExitCare® Patient Information ©2015 [employer]. This information is not intended to replace advice given to you by your health care provider. Make sure you discuss any questions you have with your health care provider. ° °

## 2015-04-26 NOTE — ED Notes (Signed)
Applied Xeroform Petrolatum Dressing and stretch bandage to pt's wound

## 2015-05-02 ENCOUNTER — Emergency Department (HOSPITAL_COMMUNITY)
Admission: EM | Admit: 2015-05-02 | Discharge: 2015-05-02 | Disposition: A | Discharge status: Home / Self Care | Payer: BLUE CROSS/BLUE SHIELD | Attending: Emergency Medicine | Admitting: Emergency Medicine

## 2015-05-02 ENCOUNTER — Encounter (HOSPITAL_COMMUNITY): Payer: Self-pay

## 2015-05-02 DIAGNOSIS — E119 Type 2 diabetes mellitus without complications: Secondary | ICD-10-CM | POA: Diagnosis not present

## 2015-05-02 DIAGNOSIS — T7840XA Allergy, unspecified, initial encounter: Secondary | ICD-10-CM

## 2015-05-02 DIAGNOSIS — Z862 Personal history of diseases of the blood and blood-forming organs and certain disorders involving the immune mechanism: Secondary | ICD-10-CM | POA: Insufficient documentation

## 2015-05-02 DIAGNOSIS — M797 Fibromyalgia: Secondary | ICD-10-CM | POA: Insufficient documentation

## 2015-05-02 DIAGNOSIS — K219 Gastro-esophageal reflux disease without esophagitis: Secondary | ICD-10-CM | POA: Diagnosis not present

## 2015-05-02 DIAGNOSIS — Z79899 Other long term (current) drug therapy: Secondary | ICD-10-CM | POA: Diagnosis not present

## 2015-05-02 LAB — CBC WITH DIFFERENTIAL/PLATELET
Basophils Absolute: 0 10*3/uL (ref 0.0–0.1)
Basophils Relative: 0 % (ref 0–1)
EOS ABS: 0.2 10*3/uL (ref 0.0–0.7)
EOS PCT: 2 % (ref 0–5)
HEMATOCRIT: 35.6 % — AB (ref 36.0–46.0)
HEMOGLOBIN: 11.5 g/dL — AB (ref 12.0–15.0)
LYMPHS PCT: 17 % (ref 12–46)
Lymphs Abs: 1.5 10*3/uL (ref 0.7–4.0)
MCH: 28.9 pg (ref 26.0–34.0)
MCHC: 32.3 g/dL (ref 30.0–36.0)
MCV: 89.4 fL (ref 78.0–100.0)
MONO ABS: 0.3 10*3/uL (ref 0.1–1.0)
MONOS PCT: 3 % (ref 3–12)
Neutro Abs: 6.6 10*3/uL (ref 1.7–7.7)
Neutrophils Relative %: 78 % — ABNORMAL HIGH (ref 43–77)
PLATELETS: 342 10*3/uL (ref 150–400)
RBC: 3.98 MIL/uL (ref 3.87–5.11)
RDW: 15.6 % — ABNORMAL HIGH (ref 11.5–15.5)
WBC: 8.5 10*3/uL (ref 4.0–10.5)

## 2015-05-02 LAB — COMPREHENSIVE METABOLIC PANEL
ALK PHOS: 76 U/L (ref 38–126)
ALT: 28 U/L (ref 14–54)
AST: 43 U/L — AB (ref 15–41)
Albumin: 3 g/dL — ABNORMAL LOW (ref 3.5–5.0)
Anion gap: 9 (ref 5–15)
BUN: 10 mg/dL (ref 6–20)
CALCIUM: 8.5 mg/dL — AB (ref 8.9–10.3)
CO2: 22 mmol/L (ref 22–32)
CREATININE: 0.84 mg/dL (ref 0.44–1.00)
Chloride: 104 mmol/L (ref 101–111)
GFR calc Af Amer: >60 mL/min (ref >60)
GFR calc non Af Amer: >60 mL/min (ref >60)
Glucose, Bld: 144 mg/dL — ABNORMAL HIGH (ref 65–99)
Potassium: 4 mmol/L (ref 3.5–5.1)
Sodium: 135 mmol/L (ref 135–145)
Total Bilirubin: 0.4 mg/dL (ref 0.3–1.2)
Total Protein: 7.6 g/dL (ref 6.5–8.1)

## 2015-05-02 LAB — SEDIMENTATION RATE: SED RATE: 70 mm/h — AB (ref 0–22)

## 2015-05-02 MED ORDER — SODIUM CHLORIDE 0.9 % IV BOLUS (SEPSIS)
1000.0000 mL | Freq: Once | INTRAVENOUS | Status: AC
Start: 2015-05-02 — End: 2015-05-02
  Administered 2015-05-02: 1000 mL via INTRAVENOUS

## 2015-05-02 MED ORDER — DIPHENHYDRAMINE HCL 50 MG/ML IJ SOLN
25.0000 mg | Freq: Once | INTRAMUSCULAR | Status: AC
  Administered 2015-05-02: 25 mg via INTRAVENOUS
  Filled 2015-05-02: qty 1

## 2015-05-02 MED ORDER — FAMOTIDINE IN NACL 20-0.9 MG/50ML-% IV SOLN
20.0000 mg | Freq: Once | INTRAVENOUS | Status: AC
  Administered 2015-05-02: 20 mg via INTRAVENOUS
  Filled 2015-05-02: qty 50

## 2015-05-02 MED ORDER — FAMOTIDINE 20 MG PO TABS: 20.0000 mg | ORAL_TABLET | Freq: Two times a day (BID) | ORAL | Status: DC

## 2015-05-02 MED ORDER — METHYLPREDNISOLONE SODIUM SUCC 125 MG IJ SOLR
125.0000 mg | Freq: Once | INTRAMUSCULAR | Status: AC
  Administered 2015-05-02: 125 mg via INTRAVENOUS
  Filled 2015-05-02: qty 2

## 2015-05-02 MED ORDER — DIPHENHYDRAMINE HCL 25 MG PO TABS: 25.0000 mg | ORAL_TABLET | Freq: Three times a day (TID) | ORAL | Status: DC

## 2015-05-02 MED ORDER — PREDNISONE 20 MG PO TABS: 40.0000 mg | ORAL_TABLET | Freq: Every day | ORAL | Status: AC

## 2015-05-02 NOTE — ED Provider Notes (Signed)
CSN: [id number]     Arrival date & time 05/02/15  1711 History   First MD Initiated Contact with Patient 05/02/15 1721     Chief Complaint  Patient presents with  . Allergic Reaction     (Consider location/radiation/quality/duration/timing/severity/associated sxs/prior Treatment) HPI Patient presents with concern of possible allergic reaction. Beginning yesterday, the patient developed a diffuse upper body rash, with pruritus. Since onset symptoms of been progressive, with minimal relief with Benadryl. She has no difficulty breathing, but does acknowledge nausea, and had one episode of vomiting. No fever, chills, chest pain, belly pain. Patient has a history of polymyositis, renewed, and was seen here 1 week ago for infection of the right great toe.  She is nearing completion of the course of antibiotics for that infection, and states that the toe generally is better. At baseline toe does not move.   Past Medical History  Diagnosis Date  . Osteoarthritis   . Polymyositis   . Fibromyalgia   . Osteopenia   . Raynaud phenomenon   . Polymyositis   . Constipation   . Shortness of breath   . Diabetes mellitus without complication   . GERD (gastroesophageal reflux disease)   . Headache(784.0)   . Anemia    Past Surgical History  Procedure Laterality Date  . Cesarean section      x3  . Tubal ligation    . Hysteroscopy      w/ D&C with excision of endometrial polyps  . Left great toenail removal  Left 2013    done by [place] [facility]  . Tonsillectomy Bilateral   . Amputation Left 12/24/2012    Procedure: AMPUTATION DIGIT left great toe;  Surgeon: Sherren Kerns, MD;  Location: [facility] OR;  Service: Vascular;  Laterality: Left;  GREAT  . Abdominal aortagram N/A 07/25/2014    Procedure: ABDOMINAL Ronny Flurry;  Surgeon: Sherren Kerns, MD;  Location: [facility] CATH LAB;  Service: Cardiovascular;  Laterality: N/A;   Family History  Problem Relation Age of Onset  . Diabetes Mother   .  Hypertension Mother   . Diabetes Father   . Heart disease Father   . Hypertension Sister   . Heart disease Sister    History  Substance Use Topics  . Smoking status: Never Smoker   . Smokeless tobacco: Never Used  . Alcohol Use: No     Comment: occasional use only   OB History    Gravida Para Term Preterm AB TAB SAB Ectopic Multiple Living   4 3   1     3      Review of Systems  Constitutional:       Per HPI, otherwise negative  HENT:       Per HPI, otherwise negative  Respiratory:       Per HPI, otherwise negative  Cardiovascular:       Per HPI, otherwise negative  Gastrointestinal: Positive for vomiting.  Endocrine:       Negative aside from HPI  Genitourinary:       Neg aside from HPI   Musculoskeletal:       Per HPI, otherwise negative  Skin: Positive for rash.  Allergic/Immunologic: Positive for immunocompromised state.  Neurological: Negative for syncope and weakness.      Allergies  Tape  Home Medications   Prior to Admission medications   Medication Sig Start Date End Date Taking? Authorizing Provider  amLODipine (NORVASC) 10 MG tablet Take 10 mg by mouth daily.    Historical Provider,  MD  cycloSPORINE modified (NEORAL) 25 MG capsule Take 50 mg by mouth 2 (two) times daily.    Historical Provider, MD  diphenhydrAMINE (BENADRYL) 25 MG tablet Take 1 tablet (25 mg total) by mouth 3 (three) times daily. 05/03/15 05/05/15  Gerhard Munch, MD  escitalopram (LEXAPRO) 10 MG tablet Take 10 mg by mouth daily.    Historical Provider, MD  famotidine (PEPCID) 20 MG tablet Take 1 tablet (20 mg total) by mouth 2 (two) times daily. 05/03/15 05/05/15  Gerhard Munch, MD  omeprazole (PRILOSEC) 40 MG capsule Take 40 mg by mouth daily.     Historical Provider, MD  oxyCODONE-acetaminophen (PERCOCET/ROXICET) 5-325 MG per tablet Take 1 tablet by mouth every 6 (six) hours as needed for severe pain. 04/26/15   Tilden Fossa, MD  predniSONE (DELTASONE) 20 MG tablet Take 2 tablets (40  mg total) by mouth daily with breakfast. 05/03/15 05/05/15  Gerhard Munch, MD  Pseudoephedrine-Ibuprofen ([employer] COLD & SINUS RELIEF) 30-200 MG TABS Take 1 tablet by mouth daily as needed (for sinuses).    Historical Provider, MD   BP 127/77 mmHg  Pulse 98  Temp(Src) 98.9 F (37.2 C) (Oral)  Resp 22  Ht 5\' 1"  (1.549 m)  Wt 140 lb (63.504 kg)  BMI 26.47 kg/m2  SpO2 95%  LMP 06/20/2012 Physical Exam  Constitutional: She is oriented to person, place, and time. She appears well-developed and well-nourished. No distress.  HENT:  Head: Normocephalic and atraumatic.  Uvula clear, no oral pharyngeal rash  Eyes: Conjunctivae and EOM are normal.  Cardiovascular: Normal rate and regular rhythm.   Pulmonary/Chest: Effort normal and breath sounds normal. No stridor. No respiratory distress.  Abdominal: She exhibits no distension.  Musculoskeletal: She exhibits no edema.       Feet:  Neurological: She is alert and oriented to person, place, and time. No cranial nerve deficit.  Skin: Skin is warm and dry.  Diffuse rash, largely in the upper extremities, torso, erythematous patches, with occasional raised papular areas  Psychiatric: She has a normal mood and affect.  Nursing note and vitals reviewed.   ED Course  Procedures (including critical care time) Labs Review Labs Reviewed  CBC WITH DIFFERENTIAL/PLATELET - Abnormal; Notable for the following:    Hemoglobin 11.5 (*)    HCT 35.6 (*)    RDW 15.6 (*)    Neutrophils Relative % 78 (*)    All other components within normal limits  COMPREHENSIVE METABOLIC PANEL - Abnormal; Notable for the following:    Glucose, Bld 144 (*)    Calcium 8.5 (*)    Albumin 3.0 (*)    AST 43 (*)    All other components within normal limits  SEDIMENTATION RATE - Abnormal; Notable for the following:    Sed Rate 70 (*)    All other components within normal limits    Pulse oximetry 99% room air normal Cardiac 105, sinus tach abnormal   On repeat exam the  patient's rash has reduced, symptoms have improved, and she has no new complaints. We discussed the medication, rheumatology follow-up, return precautions MDM   Final diagnoses:  Allergic reaction, initial encounter   Patient with history of autoimmune disease presents with ongoing symptoms consistent with allergic reaction. Patient has innumerable cutaneous lesions, but no oral pharyngeal findings, is hemodynamically stable. After IV steroids, and histamine fluids, patient improved substantially. Patient will follow up with rheumatology.  06/22/2012, MD 05/02/15 2026

## 2015-05-02 NOTE — ED Notes (Signed)
Pt started itching all over yesterday and has tried taking benadryl but it hasn't helped. States she threw up once today and hasn't felt like eating. States she hasn't had any problems breathing but does feel like she's breathing harder.

## 2015-05-02 NOTE — Discharge Instructions (Signed)
As instructed, please monitor your condition carefully, taking all medication as directed, and be sure to follow-up with your rheumatologist, via telephone in 2 days.  Return here for concerning changes.

## 2015-05-05 ENCOUNTER — Encounter: Payer: Self-pay | Admitting: Family

## 2015-05-07 ENCOUNTER — Other Ambulatory Visit: Payer: Self-pay

## 2015-05-07 ENCOUNTER — Ambulatory Visit (INDEPENDENT_AMBULATORY_CARE_PROVIDER_SITE_OTHER): Payer: BLUE CROSS/BLUE SHIELD | Admitting: Family

## 2015-05-07 ENCOUNTER — Encounter: Payer: Self-pay | Admitting: Family

## 2015-05-07 VITALS — BP 129/91 | HR 92 | Temp 97.9°F | Resp 16 | Ht 61.0 in | Wt 134.0 lb

## 2015-05-07 DIAGNOSIS — M79676 Pain in unspecified toe(s): Secondary | ICD-10-CM | POA: Insufficient documentation

## 2015-05-07 DIAGNOSIS — Z7952 Long term (current) use of systemic steroids: Secondary | ICD-10-CM | POA: Diagnosis not present

## 2015-05-07 DIAGNOSIS — M069 Rheumatoid arthritis, unspecified: Secondary | ICD-10-CM | POA: Diagnosis not present

## 2015-05-07 DIAGNOSIS — M79674 Pain in right toe(s): Secondary | ICD-10-CM | POA: Diagnosis not present

## 2015-05-07 DIAGNOSIS — L97519 Non-pressure chronic ulcer of other part of right foot with unspecified severity: Secondary | ICD-10-CM

## 2015-05-07 DIAGNOSIS — M3320 Polymyositis, organ involvement unspecified: Secondary | ICD-10-CM

## 2015-05-07 NOTE — Progress Notes (Signed)
[facility] [place] HISTORY AND PHYSICAL -PAD  History of Present Illness Allison Beasley is a 54 y.o. female patient of Dr. 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 On 07/25/14 Dr. Darrick Penna performed an aortogram with bilateral run off. 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 I have offered the patient an amputation of her toe for pain control if she wishes at some point future. She will call if she wishes to have this done at some point.  She returns today with c/o right great toe pain that is worsening, ulcerations of the medial and plantar aspects of the right great toe are worsening, per pt. She has apparently chosen to have her right great toe amputated in hopes of alleviating this pain.  Her current treatment of her right great toe ulcers is washing with soap and water and applying an orange/brown ointment and dressing daily to the toe; she does not recall the name of the ointment, states this was the extent of the wound care she was receiving in the wound care clinic and she is continuing this at home.  She has been hospitalized at [facility] visiting her daughter due to exhaustion from polymyositis after her plane trip.  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 She also has Raynaud's Syndrome and RA. Before her right leg started hurting she was walking almost 2 miles daily. She is still having right leg pain, wonders if this is due to the way she walks trying to avoid pain in the right toe. Dr. Yates Decamp is her podiatrist, [facility] in [place].   Pt Diabetic: Yes, secondary to corticosteroid use, states is fairly good control Pt smoker: non-smoker  Pt meds include: Statin :No, states her cholesterol is good Betablocker: Yes ASA: No Other anticoagulants/antiplatelets: no   Past Medical History   Diagnosis Date  . Osteoarthritis   . Polymyositis   . Fibromyalgia   . Osteopenia   . Raynaud phenomenon   . Polymyositis   . Constipation   . Shortness of breath   . Diabetes mellitus without complication   . GERD (gastroesophageal reflux disease)   . Headache(784.0)   . Anemia     Social History History  Substance Use Topics  . Smoking status: Never Smoker   . Smokeless tobacco: Never Used  . Alcohol Use: No     Comment: occasional use only    Family History Family History  Problem Relation Age of Onset  . Diabetes Mother   . Hypertension Mother   . Diabetes Father   . Heart disease Father   . Hypertension Sister   . Heart disease Sister     Past Surgical History  Procedure Laterality Date  . Cesarean section      x3  . Tubal ligation    . Hysteroscopy      w/ D&C with excision of endometrial polyps  . Left great toenail removal  Left 2013    done by [place] [facility]  . Tonsillectomy Bilateral   . Amputation Left 12/24/2012    Procedure: AMPUTATION DIGIT left great toe;  Surgeon: Sherren Kerns, MD;  Location: [facility] OR;  Service: Vascular;  Laterality: Left;  GREAT  . Abdominal aortagram N/A 07/25/2014    Procedure: ABDOMINAL Ronny Flurry;  Surgeon: Sherren Kerns, MD;  Location: [facility] CATH LAB;  Service: Cardiovascular;  Laterality: N/A;  Allergies  Allergen Reactions  . Tape Itching    Plastic tape causes itching-USE PAPER TAPE ONLY    Current Outpatient Prescriptions  Medication Sig Dispense Refill  . amLODipine (NORVASC) 10 MG tablet Take 10 mg by mouth daily.    . cycloSPORINE modified (NEORAL) 25 MG capsule Take 50 mg by mouth 2 (two) times daily.    . diphenhydrAMINE (BENADRYL) 25 MG tablet Take 1 tablet (25 mg total) by mouth 3 (three) times daily. 10 tablet 0  . escitalopram (LEXAPRO) 10 MG tablet Take 10 mg by mouth daily.    . famotidine (PEPCID) 20 MG tablet Take 1 tablet (20 mg total) by mouth 2 (two) times daily. 6 tablet 0  . omeprazole  (PRILOSEC) 40 MG capsule Take 40 mg by mouth daily.     Marland Kitchen oxyCODONE-acetaminophen (PERCOCET/ROXICET) 5-325 MG per tablet Take 1 tablet by mouth every 6 (six) hours as needed for severe pain. 15 tablet 0  . Pseudoephedrine-Ibuprofen ([employer] COLD & SINUS RELIEF) 30-200 MG TABS Take 1 tablet by mouth daily as needed (for sinuses).     No current facility-administered medications for this visit.    ROS: See HPI for pertinent positives and negatives.   Physical Examination  Filed Vitals:   05/07/15 0915  BP: 129/91  Pulse: 92  Temp: 97.9 F (36.6 C)  TempSrc: Oral  Resp: 16  Height: 5\' 1"  (1.549 m)  Weight: 134 lb (60.782 kg)  SpO2: 99%   Body mass index is 25.33 kg/(m^2).   General: A&O x 3, WDWN. Gait: normal Eyes: PERRLA. Pulmonary: CTAB, without wheezes , rales or rhonchi. Cardiac: regular Rythm , without detected murmur.     Carotid Bruits Left Right   Negative Negative  Aorta is not palpable. Radial pulses: right radial is 2+ palpable, left radial is not palpable, left brachial is 2+ palpable   VASCULAR EXAM: Extremities with ischemic changes in right great toe:  3 small ulcers at medial aspect right great toe, one is weeping scant serous drainage, darkening of plantar aspect right great toe. Left great toe is surgically absent.  Right plantar arch pulse is audible by Doppler.      LE Pulses LEFT RIGHT   FEMORAL 3+ palpable 3+ palpable    POPLITEAL not palpable  not palpable   POSTERIOR TIBIAL not palpable, audible by Doppler  not palpable, audible by Doppler    DORSALIS PEDIS  ANTERIOR TIBIAL not palpable, audible by Doppler  not palpable, audible by Doppler    Abdomen: soft, NT, no palpable masses. Skin: no rashes, see  Extremities. Musculoskeletal: no muscle wasting or atrophy.Multiple areas of nodular calcium deposits including both axillae, lateral aspects both upper thighs, lower mid back. Neurologic: A&O X 3; Appropriate Affect,; MOTOR FUNCTION: moving all extremities equally, 5/5 muscle strength in all extremities. Speech is fluent/normal. CN 2-12 intact.         ASSESSMENT: KEIONNA BASCIANO is a 54 y.o. 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 On 07/25/14 Dr. Darrick Penna performed an aortogram with bilateral run off. 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 I have offered the patient an amputation of her toe for pain control if she wishes at some point future. She will call if she wishes to have this done at some point.  She returns today with c/o right great toe pain that is worsening, ulcerations of the medial and plantar aspects of the right  great toe are worsening, per pt. She has apparently chosen to have her right great toe amputated in hopes of alleviating this pain.  Her current treatment of her right great toe ulcers is washing with soap and water and applying iodosorb iodine gel and dressing daily to the toe, states this was the extent of the wound care she was receiving in the wound care clinic and she is continuing this at home.   Dr. Darrick Penna spoke with and examined pt. Dr. Darrick Penna would like to schedule an arteriogram for tomorrow, but pt states her daughter is not available to drive her until next week.  Face to face time with patient was 25 minutes. Over 50% of this time was spent on counseling and coordination of care.   PLAN:  Based on the patient's vascular studies and examination, pt will be scheduled for arteriogram with Dr. Darrick Penna for next week, possible right great toe amputation.  I discussed in depth with the patient the nature of atherosclerosis, and emphasized the  importance of maximal medical management including strict control of blood pressure, blood glucose, and lipid levels, obtaining regular exercise, and continued cessation of smoking.  The patient is aware that without maximal medical management the underlying atherosclerotic disease process will progress, limiting the benefit of any interventions.  The patient was given information about PAD including signs, symptoms, treatment, what symptoms should prompt the patient to seek immediate medical care, and risk reduction measures to take.  Charisse March, RN, MSN, FNP-C [facility] [place] Phone: [phone]  Clinic MD: Darrick Penna  05/07/2015 9:15 AM

## 2015-05-07 NOTE — Patient Instructions (Signed)

## 2015-05-13 ENCOUNTER — Encounter (HOSPITAL_COMMUNITY): Payer: Self-pay | Admitting: Pharmacy Technician

## 2015-05-15 ENCOUNTER — Encounter (HOSPITAL_COMMUNITY): Payer: Self-pay | Admitting: Vascular Surgery

## 2015-05-15 ENCOUNTER — Ambulatory Visit (HOSPITAL_COMMUNITY)
Admission: RE | Admit: 2015-05-15 | Discharge: 2015-05-15 | Disposition: A | Discharge status: Home / Self Care | Payer: BLUE CROSS/BLUE SHIELD | Source: Ambulatory Visit | Attending: Vascular Surgery | Admitting: Vascular Surgery

## 2015-05-15 ENCOUNTER — Encounter (HOSPITAL_COMMUNITY)
Admission: RE | Disposition: A | Discharge status: Home / Self Care | Payer: Self-pay | Source: Ambulatory Visit | Attending: Vascular Surgery

## 2015-05-15 DIAGNOSIS — Z89412 Acquired absence of left great toe: Secondary | ICD-10-CM | POA: Insufficient documentation

## 2015-05-15 DIAGNOSIS — I7300 Raynaud's syndrome without gangrene: Secondary | ICD-10-CM | POA: Insufficient documentation

## 2015-05-15 DIAGNOSIS — Z79899 Other long term (current) drug therapy: Secondary | ICD-10-CM | POA: Diagnosis not present

## 2015-05-15 DIAGNOSIS — I779 Disorder of arteries and arterioles, unspecified: Secondary | ICD-10-CM | POA: Diagnosis not present

## 2015-05-15 DIAGNOSIS — K219 Gastro-esophageal reflux disease without esophagitis: Secondary | ICD-10-CM | POA: Diagnosis not present

## 2015-05-15 DIAGNOSIS — L97519 Non-pressure chronic ulcer of other part of right foot with unspecified severity: Secondary | ICD-10-CM | POA: Insufficient documentation

## 2015-05-15 DIAGNOSIS — E11621 Type 2 diabetes mellitus with foot ulcer: Secondary | ICD-10-CM | POA: Insufficient documentation

## 2015-05-15 DIAGNOSIS — M3320 Polymyositis, organ involvement unspecified: Secondary | ICD-10-CM | POA: Insufficient documentation

## 2015-05-15 DIAGNOSIS — M069 Rheumatoid arthritis, unspecified: Secondary | ICD-10-CM | POA: Diagnosis not present

## 2015-05-15 DIAGNOSIS — M797 Fibromyalgia: Secondary | ICD-10-CM | POA: Insufficient documentation

## 2015-05-15 DIAGNOSIS — M79674 Pain in right toe(s): Secondary | ICD-10-CM | POA: Diagnosis not present

## 2015-05-15 HISTORY — PX: PERIPHERAL VASCULAR CATHETERIZATION: SHX172C

## 2015-05-15 LAB — POCT I-STAT, CHEM 8
BUN: 12 mg/dL (ref 6–20)
Calcium, Ion: 1.12 mmol/L (ref 1.12–1.23)
Chloride: 106 mmol/L (ref 101–111)
Creatinine, Ser: 0.6 mg/dL (ref 0.44–1.00)
GLUCOSE: 87 mg/dL (ref 65–99)
HCT: 39 % (ref 36.0–46.0)
Hemoglobin: 13.3 g/dL (ref 12.0–15.0)
Potassium: 3.9 mmol/L (ref 3.5–5.1)
Sodium: 144 mmol/L (ref 135–145)
TCO2: 25 mmol/L (ref 0–100)

## 2015-05-15 LAB — GLUCOSE, CAPILLARY
GLUCOSE-CAPILLARY: 96 mg/dL (ref 65–99)
Glucose-Capillary: 61 mg/dL — ABNORMAL LOW (ref 65–99)

## 2015-05-15 SURGERY — ABDOMINAL AORTOGRAM
Anesthesia: LOCAL

## 2015-05-15 MED ORDER — METOPROLOL TARTRATE 1 MG/ML IV SOLN: 2.0000 mg | INTRAVENOUS | Status: DC | PRN

## 2015-05-15 MED ORDER — LIDOCAINE HCL (PF) 1 % IJ SOLN
INTRAMUSCULAR | Status: DC | PRN
  Administered 2015-05-15: 16 mL

## 2015-05-15 MED ORDER — OXYCODONE HCL 5 MG PO TABS
ORAL_TABLET | ORAL | Status: AC
  Filled 2015-05-15: qty 1

## 2015-05-15 MED ORDER — MORPHINE SULFATE 10 MG/ML IJ SOLN: 2.0000 mg | INTRAMUSCULAR | Status: DC | PRN

## 2015-05-15 MED ORDER — MORPHINE SULFATE 2 MG/ML IJ SOLN
INTRAMUSCULAR | Status: AC
  Administered 2015-05-15: 2 mg via INTRAVENOUS
  Filled 2015-05-15: qty 1

## 2015-05-15 MED ORDER — ACETAMINOPHEN 325 MG RE SUPP: 325.0000 mg | RECTAL | Status: DC | PRN

## 2015-05-15 MED ORDER — SODIUM CHLORIDE 0.9 % IV SOLN
INTRAVENOUS | Status: DC
  Administered 2015-05-15: 06:00:00 via INTRAVENOUS

## 2015-05-15 MED ORDER — HEPARIN (PORCINE) IN NACL 2-0.9 UNIT/ML-% IJ SOLN
INTRAMUSCULAR | Status: AC
  Filled 2015-05-15: qty 1000

## 2015-05-15 MED ORDER — FENTANYL CITRATE (PF) 100 MCG/2ML IJ SOLN
INTRAMUSCULAR | Status: DC | PRN
  Administered 2015-05-15: 25 ug via INTRAVENOUS

## 2015-05-15 MED ORDER — HYDRALAZINE HCL 20 MG/ML IJ SOLN: 5.0000 mg | INTRAMUSCULAR | Status: DC | PRN

## 2015-05-15 MED ORDER — OXYCODONE HCL 5 MG PO TABS
ORAL_TABLET | ORAL | Status: AC
  Administered 2015-05-15: 10 mg via ORAL
  Filled 2015-05-15: qty 2

## 2015-05-15 MED ORDER — ONDANSETRON HCL 4 MG/2ML IJ SOLN: 4.0000 mg | Freq: Four times a day (QID) | INTRAMUSCULAR | Status: DC | PRN

## 2015-05-15 MED ORDER — LIDOCAINE HCL (PF) 1 % IJ SOLN
INTRAMUSCULAR | Status: AC
  Filled 2015-05-15: qty 30

## 2015-05-15 MED ORDER — OXYCODONE HCL 5 MG PO TABS
5.0000 mg | ORAL_TABLET | ORAL | Status: DC | PRN
  Administered 2015-05-15: 10 mg via ORAL
  Administered 2015-05-15: 5 mg via ORAL
  Filled 2015-05-15 (×2): qty 2

## 2015-05-15 MED ORDER — FENTANYL CITRATE (PF) 100 MCG/2ML IJ SOLN
INTRAMUSCULAR | Status: AC
  Filled 2015-05-15: qty 2

## 2015-05-15 MED ORDER — ACETAMINOPHEN 325 MG PO TABS: 325.0000 mg | ORAL_TABLET | ORAL | Status: DC | PRN

## 2015-05-15 MED ORDER — LABETALOL HCL 5 MG/ML IV SOLN: 10.0000 mg | INTRAVENOUS | Status: DC | PRN

## 2015-05-15 MED ORDER — SODIUM CHLORIDE 0.45 % IV SOLN
INTRAVENOUS | Status: DC
  Administered 2015-05-15 (×2): via INTRAVENOUS

## 2015-05-15 SURGICAL SUPPLY — 11 items
CATH CROSS OVER TEMPO 5F (CATHETERS) ×1 IMPLANT
CATH INFINITI 5 FR STR PIGTAIL (CATHETERS) ×1 IMPLANT
CATH STRAIGHT 5FR 65CM (CATHETERS) ×1 IMPLANT
COVER PRB 48X5XTLSCP FOLD TPE (BAG) IMPLANT
COVER PROBE 5X48 (BAG) ×2
KIT PV (KITS) ×2 IMPLANT
SHEATH PINNACLE 5F 10CM (SHEATH) ×1 IMPLANT
SYR MEDRAD MARK V 150ML (SYRINGE) ×2 IMPLANT
TRANSDUCER W/STOPCOCK (MISCELLANEOUS) ×2 IMPLANT
TRAY PV CATH (CUSTOM PROCEDURE TRAY) ×2 IMPLANT
WIRE HITORQ VERSACORE ST 145CM (WIRE) ×1 IMPLANT

## 2015-05-15 NOTE — Progress Notes (Signed)
Site area: lt groin femoral arterial sheath Site Prior to Removal:  Level  0 Pressure Applied For:  20 minutes Manual:   yes Patient Status During Pull:  stable Post Pull Site:  Level  0 Post Pull Instructions Given:  yes Post Pull Pulses Present: yes Dressing Applied:  tegaderm Bedrest begins @   0900 Comments:  0

## 2015-05-15 NOTE — Interval H&P Note (Signed)
History and Physical Interval Note:  05/15/2015 7:38 AM  Allison Beasley  has presented today for surgery, with the diagnosis of pad with right toe ulcers  The various methods of treatment have been discussed with the patient and family. After consideration of risks, benefits and other options for treatment, the patient has consented to  Procedure(s): Abdominal Aortogram (N/A) as a surgical intervention .  The patient's history has been reviewed, patient examined, no change in status, stable for surgery.  I have reviewed the patient's chart and labs.  Questions were answered to the patient's satisfaction.     Fabienne Bruns

## 2015-05-15 NOTE — Discharge Instructions (Signed)

## 2015-05-15 NOTE — H&P (View-Only) (Signed)
[facility] [place] HISTORY AND PHYSICAL -PAD  History of Present Illness MARKIA KYER is a 54 y.o. female patient of Dr. 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 On 07/25/14 Dr. Darrick Penna performed an aortogram with bilateral run off. 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 I have offered the patient an amputation of her toe for pain control if she wishes at some point future. She will call if she wishes to have this done at some point.  She returns today with c/o right great toe pain that is worsening, ulcerations of the medial and plantar aspects of the right great toe are worsening, per pt. She has apparently chosen to have her right great toe amputated in hopes of alleviating this pain.  Her current treatment of her right great toe ulcers is washing with soap and water and applying an orange/brown ointment and dressing daily to the toe; she does not recall the name of the ointment, states this was the extent of the wound care she was receiving in the wound care clinic and she is continuing this at home.  She has been hospitalized at [facility] visiting her daughter due to exhaustion from polymyositis after her plane trip.  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 She also has Raynaud's Syndrome and RA. Before her right leg started hurting she was walking almost 2 miles daily. She is still having right leg pain, wonders if this is due to the way she walks trying to avoid pain in the right toe. Dr. Yates Decamp is her podiatrist, [facility] in [place].   Pt Diabetic: Yes, secondary to corticosteroid use, states is fairly good control Pt smoker: non-smoker  Pt meds include: Statin :No, states her cholesterol is good Betablocker: Yes ASA: No Other anticoagulants/antiplatelets: no   Past Medical History   Diagnosis Date  . Osteoarthritis   . Polymyositis   . Fibromyalgia   . Osteopenia   . Raynaud phenomenon   . Polymyositis   . Constipation   . Shortness of breath   . Diabetes mellitus without complication   . GERD (gastroesophageal reflux disease)   . Headache(784.0)   . Anemia     Social History History  Substance Use Topics  . Smoking status: Never Smoker   . Smokeless tobacco: Never Used  . Alcohol Use: No     Comment: occasional use only    Family History Family History  Problem Relation Age of Onset  . Diabetes Mother   . Hypertension Mother   . Diabetes Father   . Heart disease Father   . Hypertension Sister   . Heart disease Sister     Past Surgical History  Procedure Laterality Date  . Cesarean section      x3  . Tubal ligation    . Hysteroscopy      w/ D&C with excision of endometrial polyps  . Left great toenail removal  Left 2013    done by [place] [facility]  . Tonsillectomy Bilateral   . Amputation Left 12/24/2012    Procedure: AMPUTATION DIGIT left great toe;  Surgeon: Sherren Kerns, MD;  Location: [facility] OR;  Service: Vascular;  Laterality: Left;  GREAT  . Abdominal aortagram N/A 07/25/2014    Procedure: ABDOMINAL Ronny Flurry;  Surgeon: Sherren Kerns, MD;  Location: [facility] CATH LAB;  Service: Cardiovascular;  Laterality: N/A;  Allergies  Allergen Reactions  . Tape Itching    Plastic tape causes itching-USE PAPER TAPE ONLY    Current Outpatient Prescriptions  Medication Sig Dispense Refill  . amLODipine (NORVASC) 10 MG tablet Take 10 mg by mouth daily.    . cycloSPORINE modified (NEORAL) 25 MG capsule Take 50 mg by mouth 2 (two) times daily.    . diphenhydrAMINE (BENADRYL) 25 MG tablet Take 1 tablet (25 mg total) by mouth 3 (three) times daily. 10 tablet 0  . escitalopram (LEXAPRO) 10 MG tablet Take 10 mg by mouth daily.    . famotidine (PEPCID) 20 MG tablet Take 1 tablet (20 mg total) by mouth 2 (two) times daily. 6 tablet 0  . omeprazole  (PRILOSEC) 40 MG capsule Take 40 mg by mouth daily.     . oxyCODONE-acetaminophen (PERCOCET/ROXICET) 5-325 MG per tablet Take 1 tablet by mouth every 6 (six) hours as needed for severe pain. 15 tablet 0  . Pseudoephedrine-Ibuprofen ([employer] COLD & SINUS RELIEF) 30-200 MG TABS Take 1 tablet by mouth daily as needed (for sinuses).     No current facility-administered medications for this visit.    ROS: See HPI for pertinent positives and negatives.   Physical Examination  Filed Vitals:   05/07/15 0915  BP: 129/91  Pulse: 92  Temp: 97.9 F (36.6 C)  TempSrc: Oral  Resp: 16  Height: 5' 1" (1.549 m)  Weight: 134 lb (60.782 kg)  SpO2: 99%   Body mass index is 25.33 kg/(m^2).   General: A&O x 3, WDWN. Gait: normal Eyes: PERRLA. Pulmonary: CTAB, without wheezes , rales or rhonchi. Cardiac: regular Rythm , without detected murmur.     Carotid Bruits Left Right   Negative Negative  Aorta is not palpable. Radial pulses: right radial is 2+ palpable, left radial is not palpable, left brachial is 2+ palpable   VASCULAR EXAM: Extremities with ischemic changes in right great toe:  3 small ulcers at medial aspect right great toe, one is weeping scant serous drainage, darkening of plantar aspect right great toe. Left great toe is surgically absent.  Right plantar arch pulse is audible by Doppler.      LE Pulses LEFT RIGHT   FEMORAL 3+ palpable 3+ palpable    POPLITEAL not palpable  not palpable   POSTERIOR TIBIAL not palpable, audible by Doppler  not palpable, audible by Doppler    DORSALIS PEDIS  ANTERIOR TIBIAL not palpable, audible by Doppler  not palpable, audible by Doppler    Abdomen: soft, NT, no palpable masses. Skin: no rashes, see  Extremities. Musculoskeletal: no muscle wasting or atrophy.Multiple areas of nodular calcium deposits including both axillae, lateral aspects both upper thighs, lower mid back. Neurologic: A&O X 3; Appropriate Affect,; MOTOR FUNCTION: moving all extremities equally, 5/5 muscle strength in all extremities. Speech is fluent/normal. CN 2-12 intact.         ASSESSMENT: Allison Beasley is a 53 y.o. 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 On 07/25/14 Dr. Fields performed an aortogram with bilateral run off. 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 I have offered the patient an amputation of her toe for pain control if she wishes at some point future. She will call if she wishes to have this done at some point.  She returns today with c/o right great toe pain that is worsening, ulcerations of the medial and plantar aspects of the right   great toe are worsening, per pt. She has apparently chosen to have her right great toe amputated in hopes of alleviating this pain.  Her current treatment of her right great toe ulcers is washing with soap and water and applying iodosorb iodine gel and dressing daily to the toe, states this was the extent of the wound care she was receiving in the wound care clinic and she is continuing this at home.   Dr. Darrick Penna spoke with and examined pt. Dr. Darrick Penna would like to schedule an arteriogram for tomorrow, but pt states her daughter is not available to drive her until next week.  Face to face time with patient was 25 minutes. Over 50% of this time was spent on counseling and coordination of care.   PLAN:  Based on the patient's vascular studies and examination, pt will be scheduled for arteriogram with Dr. Darrick Penna for next week, possible right great toe amputation.  I discussed in depth with the patient the nature of atherosclerosis, and emphasized the  importance of maximal medical management including strict control of blood pressure, blood glucose, and lipid levels, obtaining regular exercise, and continued cessation of smoking.  The patient is aware that without maximal medical management the underlying atherosclerotic disease process will progress, limiting the benefit of any interventions.  The patient was given information about PAD including signs, symptoms, treatment, what symptoms should prompt the patient to seek immediate medical care, and risk reduction measures to take.  Charisse March, RN, MSN, FNP-C [facility] [place] Phone: [phone]  Clinic MD: Darrick Penna  05/07/2015 9:15 AM

## 2015-05-18 ENCOUNTER — Other Ambulatory Visit: Payer: Self-pay

## 2015-05-18 ENCOUNTER — Encounter (HOSPITAL_COMMUNITY): Payer: Self-pay | Admitting: Vascular Surgery

## 2015-05-19 ENCOUNTER — Inpatient Hospital Stay (HOSPITAL_COMMUNITY): Payer: BLUE CROSS/BLUE SHIELD | Admitting: Anesthesiology

## 2015-05-19 ENCOUNTER — Encounter (HOSPITAL_COMMUNITY): Payer: Self-pay | Admitting: *Deleted

## 2015-05-19 ENCOUNTER — Observation Stay (HOSPITAL_COMMUNITY)
Admission: RE | Admit: 2015-05-19 | Discharge: 2015-05-20 | Disposition: A | Discharge status: Home / Self Care | Payer: BLUE CROSS/BLUE SHIELD | Source: Ambulatory Visit | Attending: Vascular Surgery | Admitting: Vascular Surgery

## 2015-05-19 ENCOUNTER — Encounter (HOSPITAL_COMMUNITY)
Admission: RE | Disposition: A | Discharge status: Home / Self Care | Payer: Self-pay | Source: Ambulatory Visit | Attending: Vascular Surgery

## 2015-05-19 DIAGNOSIS — M069 Rheumatoid arthritis, unspecified: Secondary | ICD-10-CM | POA: Insufficient documentation

## 2015-05-19 DIAGNOSIS — M86171 Other acute osteomyelitis, right ankle and foot: Secondary | ICD-10-CM | POA: Diagnosis not present

## 2015-05-19 DIAGNOSIS — M868X8 Other osteomyelitis, other site: Secondary | ICD-10-CM | POA: Diagnosis present

## 2015-05-19 DIAGNOSIS — I10 Essential (primary) hypertension: Secondary | ICD-10-CM | POA: Insufficient documentation

## 2015-05-19 DIAGNOSIS — M797 Fibromyalgia: Secondary | ICD-10-CM | POA: Insufficient documentation

## 2015-05-19 DIAGNOSIS — I7300 Raynaud's syndrome without gangrene: Secondary | ICD-10-CM | POA: Insufficient documentation

## 2015-05-19 DIAGNOSIS — M868X7 Other osteomyelitis, ankle and foot: Principal | ICD-10-CM | POA: Insufficient documentation

## 2015-05-19 DIAGNOSIS — E1151 Type 2 diabetes mellitus with diabetic peripheral angiopathy without gangrene: Secondary | ICD-10-CM | POA: Diagnosis not present

## 2015-05-19 DIAGNOSIS — L089 Local infection of the skin and subcutaneous tissue, unspecified: Secondary | ICD-10-CM | POA: Diagnosis present

## 2015-05-19 DIAGNOSIS — M1990 Unspecified osteoarthritis, unspecified site: Secondary | ICD-10-CM | POA: Insufficient documentation

## 2015-05-19 DIAGNOSIS — E11621 Type 2 diabetes mellitus with foot ulcer: Secondary | ICD-10-CM | POA: Insufficient documentation

## 2015-05-19 HISTORY — DX: Essential (primary) hypertension: I10

## 2015-05-19 HISTORY — PX: AMPUTATION: SHX166

## 2015-05-19 HISTORY — DX: Type 2 diabetes mellitus without complications: E11.9

## 2015-05-19 HISTORY — DX: Major depressive disorder, single episode, unspecified: F32.9

## 2015-05-19 HISTORY — DX: Peripheral vascular disease, unspecified: I73.9

## 2015-05-19 HISTORY — DX: Pneumonia, unspecified organism: J18.9

## 2015-05-19 HISTORY — DX: Anxiety disorder, unspecified: F41.9

## 2015-05-19 HISTORY — DX: Depression, unspecified: F32.A

## 2015-05-19 LAB — GLUCOSE, CAPILLARY
GLUCOSE-CAPILLARY: 88 mg/dL (ref 65–99)
GLUCOSE-CAPILLARY: 108 mg/dL — AB (ref 65–99)
Glucose-Capillary: 48 mg/dL — ABNORMAL LOW (ref 65–99)
Glucose-Capillary: 84 mg/dL (ref 65–99)
Glucose-Capillary: 131 mg/dL — ABNORMAL HIGH (ref 65–99)

## 2015-05-19 LAB — CREATININE, SERUM
Creatinine, Ser: 0.56 mg/dL (ref 0.44–1.00)
GFR calc non Af Amer: >60 mL/min (ref >60)

## 2015-05-19 LAB — CBC
HEMATOCRIT: 37.9 % (ref 36.0–46.0)
HEMOGLOBIN: 12.3 g/dL (ref 12.0–15.0)
MCH: 29.4 pg (ref 26.0–34.0)
MCHC: 32.5 g/dL (ref 30.0–36.0)
MCV: 90.5 fL (ref 78.0–100.0)
Platelets: 257 10*3/uL (ref 150–400)
RBC: 4.19 MIL/uL (ref 3.87–5.11)
RDW: 15.4 % (ref 11.5–15.5)
WBC: 7.6 10*3/uL (ref 4.0–10.5)

## 2015-05-19 SURGERY — AMPUTATION DIGIT
Anesthesia: General | Site: Toe | Laterality: Right

## 2015-05-19 MED ORDER — LACTATED RINGERS IV SOLN
INTRAVENOUS | Status: DC | PRN
  Administered 2015-05-19: 09:00:00 via INTRAVENOUS

## 2015-05-19 MED ORDER — MORPHINE SULFATE 2 MG/ML IJ SOLN
INTRAMUSCULAR | Status: AC
  Filled 2015-05-19: qty 1

## 2015-05-19 MED ORDER — POTASSIUM CHLORIDE CRYS ER 20 MEQ PO TBCR
20.0000 meq | EXTENDED_RELEASE_TABLET | Freq: Once | ORAL | Status: DC
  Filled 2015-05-19: qty 1

## 2015-05-19 MED ORDER — MIDAZOLAM HCL 2 MG/2ML IJ SOLN
INTRAMUSCULAR | Status: AC
  Filled 2015-05-19: qty 2

## 2015-05-19 MED ORDER — HYDROMORPHONE HCL 1 MG/ML IJ SOLN
INTRAMUSCULAR | Status: AC
  Filled 2015-05-19: qty 1

## 2015-05-19 MED ORDER — MORPHINE SULFATE 4 MG/ML IJ SOLN
INTRAMUSCULAR | Status: AC
  Filled 2015-05-19: qty 1

## 2015-05-19 MED ORDER — HYDROCODONE-ACETAMINOPHEN 5-325 MG PO TABS: 1.0000 | ORAL_TABLET | Freq: Two times a day (BID) | ORAL | Status: DC

## 2015-05-19 MED ORDER — LIDOCAINE HCL (CARDIAC) 20 MG/ML IV SOLN
INTRAVENOUS | Status: DC | PRN
Start: 2015-05-19 — End: 2015-05-19
  Administered 2015-05-19: 80 mg via INTRAVENOUS

## 2015-05-19 MED ORDER — HYDROMORPHONE HCL 1 MG/ML IJ SOLN
0.5000 mg | Freq: Once | INTRAMUSCULAR | Status: AC
  Administered 2015-05-19: 0.5 mg via INTRAVENOUS

## 2015-05-19 MED ORDER — PROMETHAZINE HCL 25 MG/ML IJ SOLN: 6.2500 mg | INTRAMUSCULAR | Status: DC | PRN

## 2015-05-19 MED ORDER — LIDOCAINE HCL (CARDIAC) 20 MG/ML IV SOLN
INTRAVENOUS | Status: AC
  Filled 2015-05-19: qty 5

## 2015-05-19 MED ORDER — ESCITALOPRAM OXALATE 10 MG PO TABS
10.0000 mg | ORAL_TABLET | Freq: Every day | ORAL | Status: DC
  Administered 2015-05-19 – 2015-05-20 (×2): 10 mg via ORAL
  Filled 2015-05-19 (×2): qty 1

## 2015-05-19 MED ORDER — DEXTROSE 5 % IV SOLN
1.5000 g | INTRAVENOUS | Status: AC
  Administered 2015-05-19: 1.5 g via INTRAVENOUS
  Filled 2015-05-19: qty 1.5

## 2015-05-19 MED ORDER — ACETAMINOPHEN 650 MG RE SUPP: 325.0000 mg | RECTAL | Status: DC | PRN

## 2015-05-19 MED ORDER — SENNOSIDES-DOCUSATE SODIUM 8.6-50 MG PO TABS
1.0000 | ORAL_TABLET | Freq: Every evening | ORAL | Status: DC | PRN
  Filled 2015-05-19: qty 1

## 2015-05-19 MED ORDER — OXYCODONE-ACETAMINOPHEN 5-325 MG PO TABS
ORAL_TABLET | ORAL | Status: AC
  Filled 2015-05-19: qty 2

## 2015-05-19 MED ORDER — METHOCARBAMOL 1000 MG/10ML IJ SOLN: 500.0000 mg | Freq: Three times a day (TID) | INTRAVENOUS | Status: DC

## 2015-05-19 MED ORDER — OXYCODONE-ACETAMINOPHEN 5-325 MG PO TABS
1.0000 | ORAL_TABLET | ORAL | Status: DC | PRN
  Administered 2015-05-19 – 2015-05-20 (×3): 2 via ORAL
  Filled 2015-05-19 (×2): qty 2

## 2015-05-19 MED ORDER — FENTANYL CITRATE (PF) 100 MCG/2ML IJ SOLN
INTRAMUSCULAR | Status: DC | PRN
  Administered 2015-05-19 (×3): 25 ug via INTRAVENOUS
  Administered 2015-05-19: 50 ug via INTRAVENOUS
  Administered 2015-05-19: 25 ug via INTRAVENOUS
  Administered 2015-05-19: 50 ug via INTRAVENOUS

## 2015-05-19 MED ORDER — MORPHINE SULFATE 2 MG/ML IJ SOLN
2.0000 mg | INTRAMUSCULAR | Status: DC | PRN
  Administered 2015-05-19 – 2015-05-20 (×3): 4 mg via INTRAVENOUS
  Filled 2015-05-19 (×4): qty 2

## 2015-05-19 MED ORDER — CHLORHEXIDINE GLUCONATE CLOTH 2 % EX PADS: 6.0000 | MEDICATED_PAD | Freq: Once | CUTANEOUS | Status: DC

## 2015-05-19 MED ORDER — FENTANYL CITRATE (PF) 250 MCG/5ML IJ SOLN
INTRAMUSCULAR | Status: AC
  Filled 2015-05-19: qty 5

## 2015-05-19 MED ORDER — SODIUM CHLORIDE 0.9 % IV SOLN: INTRAVENOUS | Status: DC

## 2015-05-19 MED ORDER — METOPROLOL TARTRATE 1 MG/ML IV SOLN: 2.0000 mg | INTRAVENOUS | Status: DC | PRN

## 2015-05-19 MED ORDER — PHENYLEPHRINE 40 MCG/ML (10ML) SYRINGE FOR IV PUSH (FOR BLOOD PRESSURE SUPPORT)
PREFILLED_SYRINGE | INTRAVENOUS | Status: AC
  Filled 2015-05-19: qty 10

## 2015-05-19 MED ORDER — PREDNISONE 10 MG PO TABS
10.0000 mg | ORAL_TABLET | Freq: Every day | ORAL | Status: DC
  Administered 2015-05-19 – 2015-05-20 (×2): 10 mg via ORAL
  Filled 2015-05-19 (×3): qty 1

## 2015-05-19 MED ORDER — PROPOFOL 10 MG/ML IV BOLUS
INTRAVENOUS | Status: AC
  Filled 2015-05-19: qty 20

## 2015-05-19 MED ORDER — DEXTROSE 50 % IV SOLN
25.0000 mL | Freq: Once | INTRAVENOUS | Status: AC
  Administered 2015-05-19: 25 mL via INTRAVENOUS

## 2015-05-19 MED ORDER — LABETALOL HCL 5 MG/ML IV SOLN
10.0000 mg | INTRAVENOUS | Status: DC | PRN
  Filled 2015-05-19: qty 4

## 2015-05-19 MED ORDER — ONDANSETRON HCL 4 MG/2ML IJ SOLN
INTRAMUSCULAR | Status: AC
  Filled 2015-05-19: qty 2

## 2015-05-19 MED ORDER — PANTOPRAZOLE SODIUM 40 MG PO TBEC
40.0000 mg | DELAYED_RELEASE_TABLET | Freq: Every day | ORAL | Status: DC
  Administered 2015-05-19 – 2015-05-20 (×2): 40 mg via ORAL
  Filled 2015-05-19 (×2): qty 1

## 2015-05-19 MED ORDER — PANTOPRAZOLE SODIUM 40 MG PO TBEC: 40.0000 mg | DELAYED_RELEASE_TABLET | Freq: Every day | ORAL | Status: DC

## 2015-05-19 MED ORDER — 0.9 % SODIUM CHLORIDE (POUR BTL) OPTIME
TOPICAL | Status: DC | PRN
  Administered 2015-05-19: 1000 mL

## 2015-05-19 MED ORDER — HYDROCORTISONE NA SUCCINATE PF 100 MG IJ SOLR
INTRAMUSCULAR | Status: DC | PRN
  Administered 2015-05-19: 100 mg via INTRAVENOUS

## 2015-05-19 MED ORDER — DEXTROSE 50 % IV SOLN
INTRAVENOUS | Status: AC
  Administered 2015-05-19: 25 mL via INTRAVENOUS
  Filled 2015-05-19: qty 50

## 2015-05-19 MED ORDER — PHENOL 1.4 % MT LIQD
1.0000 | OROMUCOSAL | Status: DC | PRN
  Filled 2015-05-19: qty 177

## 2015-05-19 MED ORDER — MORPHINE SULFATE 2 MG/ML IJ SOLN
1.0000 mg | INTRAMUSCULAR | Status: DC | PRN
  Administered 2015-05-19 (×6): 2 mg via INTRAVENOUS

## 2015-05-19 MED ORDER — ONDANSETRON HCL 4 MG/2ML IJ SOLN
4.0000 mg | Freq: Four times a day (QID) | INTRAMUSCULAR | Status: DC | PRN
  Administered 2015-05-19: 4 mg via INTRAVENOUS
  Filled 2015-05-19 (×2): qty 2

## 2015-05-19 MED ORDER — ENOXAPARIN SODIUM 30 MG/0.3ML ~~LOC~~ SOLN
30.0000 mg | SUBCUTANEOUS | Status: DC
  Administered 2015-05-19: 30 mg via SUBCUTANEOUS
  Filled 2015-05-19 (×2): qty 0.3

## 2015-05-19 MED ORDER — LACTATED RINGERS IV SOLN
INTRAVENOUS | Status: DC
  Administered 2015-05-19: 08:00:00 via INTRAVENOUS

## 2015-05-19 MED ORDER — METHOCARBAMOL 1000 MG/10ML IJ SOLN
500.0000 mg | Freq: Once | INTRAVENOUS | Status: AC
  Administered 2015-05-19: 500 mg via INTRAVENOUS
  Filled 2015-05-19 (×2): qty 5

## 2015-05-19 MED ORDER — DIPHENHYDRAMINE HCL 25 MG PO CAPS
25.0000 mg | ORAL_CAPSULE | Freq: Two times a day (BID) | ORAL | Status: DC | PRN
  Administered 2015-05-20: 25 mg via ORAL
  Filled 2015-05-19 (×2): qty 1

## 2015-05-19 MED ORDER — FENTANYL CITRATE (PF) 100 MCG/2ML IJ SOLN
INTRAMUSCULAR | Status: AC
  Filled 2015-05-19: qty 2

## 2015-05-19 MED ORDER — ALUM & MAG HYDROXIDE-SIMETH 200-200-20 MG/5ML PO SUSP: 15.0000 mL | ORAL | Status: DC | PRN

## 2015-05-19 MED ORDER — PHENYLEPHRINE HCL 10 MG/ML IJ SOLN
INTRAMUSCULAR | Status: DC | PRN
  Administered 2015-05-19 (×3): 80 ug via INTRAVENOUS

## 2015-05-19 MED ORDER — HYDROCORTISONE NA SUCCINATE PF 100 MG IJ SOLR
INTRAMUSCULAR | Status: AC
  Filled 2015-05-19: qty 2

## 2015-05-19 MED ORDER — ONDANSETRON HCL 4 MG/2ML IJ SOLN
INTRAMUSCULAR | Status: DC | PRN
  Administered 2015-05-19: 4 mg via INTRAVENOUS

## 2015-05-19 MED ORDER — AMLODIPINE BESYLATE 10 MG PO TABS
10.0000 mg | ORAL_TABLET | Freq: Every day | ORAL | Status: DC
Start: 2015-05-20 — End: 2015-05-20
  Administered 2015-05-20: 10 mg via ORAL
  Filled 2015-05-19: qty 1

## 2015-05-19 MED ORDER — CYCLOSPORINE MODIFIED (NEORAL) 25 MG PO CAPS
50.0000 mg | ORAL_CAPSULE | Freq: Two times a day (BID) | ORAL | Status: DC
  Filled 2015-05-19 (×3): qty 2

## 2015-05-19 MED ORDER — HYDRALAZINE HCL 20 MG/ML IJ SOLN: 5.0000 mg | INTRAMUSCULAR | Status: DC | PRN

## 2015-05-19 MED ORDER — GUAIFENESIN-DM 100-10 MG/5ML PO SYRP: 15.0000 mL | ORAL_SOLUTION | ORAL | Status: DC | PRN

## 2015-05-19 MED ORDER — PROPOFOL 10 MG/ML IV BOLUS
INTRAVENOUS | Status: DC | PRN
  Administered 2015-05-19: 170 mg via INTRAVENOUS

## 2015-05-19 MED ORDER — MIDAZOLAM HCL 5 MG/5ML IJ SOLN
INTRAMUSCULAR | Status: DC | PRN
  Administered 2015-05-19 (×2): 1 mg via INTRAVENOUS

## 2015-05-19 MED ORDER — ACETAMINOPHEN 325 MG PO TABS: 325.0000 mg | ORAL_TABLET | ORAL | Status: DC | PRN

## 2015-05-19 SURGICAL SUPPLY — 31 items
BANDAGE ELASTIC 4 VELCRO ST LF (GAUZE/BANDAGES/DRESSINGS) ×2 IMPLANT
BNDG GAUZE ELAST 4 BULKY (GAUZE/BANDAGES/DRESSINGS) ×2 IMPLANT
CANISTER SUCTION 2500CC (MISCELLANEOUS) ×2 IMPLANT
COVER SURGICAL LIGHT HANDLE (MISCELLANEOUS) ×2 IMPLANT
DRAPE EXTREMITY T 121X128X90 (DRAPE) ×2 IMPLANT
DRSG EMULSION OIL 3X3 NADH (GAUZE/BANDAGES/DRESSINGS) ×2 IMPLANT
ELECT REM PT RETURN 9FT ADLT (ELECTROSURGICAL) ×2
ELECTRODE REM PT RTRN 9FT ADLT (ELECTROSURGICAL) ×1 IMPLANT
GAUZE SPONGE 4X4 12PLY STRL (GAUZE/BANDAGES/DRESSINGS) ×2 IMPLANT
GLOVE BIO SURGEON STRL SZ 6.5 (GLOVE) ×2 IMPLANT
GLOVE BIO SURGEON STRL SZ7.5 (GLOVE) ×2 IMPLANT
GLOVE BIOGEL PI IND STRL 6.5 (GLOVE) IMPLANT
GLOVE BIOGEL PI INDICATOR 6.5 (GLOVE) ×1
GLOVE SKINSENSE NS SZ7.0 (GLOVE) ×1
GLOVE SKINSENSE STRL SZ7.0 (GLOVE) IMPLANT
GOWN STRL REUS W/ TWL LRG LVL3 (GOWN DISPOSABLE) ×3 IMPLANT
GOWN STRL REUS W/TWL LRG LVL3 (GOWN DISPOSABLE) ×6
KIT BASIN OR (CUSTOM PROCEDURE TRAY) ×2 IMPLANT
KIT ROOM TURNOVER OR (KITS) ×2 IMPLANT
NS IRRIG 1000ML POUR BTL (IV SOLUTION) ×2 IMPLANT
PACK GENERAL/GYN (CUSTOM PROCEDURE TRAY) ×2 IMPLANT
PAD ARMBOARD 7.5X6 YLW CONV (MISCELLANEOUS) ×4 IMPLANT
SPECIMEN JAR SMALL (MISCELLANEOUS) ×2 IMPLANT
SPONGE GAUZE 4X4 12PLY STER LF (GAUZE/BANDAGES/DRESSINGS) ×1 IMPLANT
SUT ETHILON 3 0 PS 1 (SUTURE) ×2 IMPLANT
SUT VIC AB 3-0 SH 27 (SUTURE)
SUT VIC AB 3-0 SH 27X BRD (SUTURE) IMPLANT
SWAB COLLECTION DEVICE MRSA (MISCELLANEOUS) IMPLANT
TUBE ANAEROBIC SPECIMEN COL (MISCELLANEOUS) IMPLANT
UNDERPAD 30X30 INCONTINENT (UNDERPADS AND DIAPERS) ×2 IMPLANT
WATER STERILE IRR 1000ML POUR (IV SOLUTION) ×2 IMPLANT

## 2015-05-19 NOTE — Progress Notes (Signed)
Dr. Jacklynn Bue aware pt. Has had 12 of Morphine IC and 500 of Robaxin ,still with 10/10 pain, orders recieved

## 2015-05-19 NOTE — Op Note (Signed)
Procedure: Amputation right first toe with resection of metatarsal head  Preoperative diagnosis: Osteomyelitis right first toe  Postoperative diagnosis: Same  Anesthesia: Gen.  Specimens: Right first toe  Assistant: Nurse   Operative details: After obtaining informed consent, the patient was taken to the operating room. The patient was placed in supine position on the operating room table. After induction of general anesthesia the patient's entire right foot was prepped and draped in usual sterile fashion. A circumferential incision was made at the base of the right first toe. The incision was carried all the way down to the level of the bone. There was a small amount of purulent material on the medial aspect of the toe.  There was some skin edge bleeding. The bone was transected with a bone cutter in the midportion of the proximal phalanx. The remainder of the proximal phalanx was debrided away with rongeurs. The metatarsal head was removed with rongeurs. The wound was thoroughly irrigated with normal saline solution. Hemostasis was obtained. Skin edges were reapproximated using interrupted 3-0 simple nylon sutures. A dry sterile dressing was applied. The patient tolerated procedure well and there were no complications. Instrument sponge and needle counts were correct at the end of the case. The patient was taken to recovery in stable condition.   Fabienne Bruns, MD  [facility] [place]  Office: [phone]  Pager: [phone]

## 2015-05-19 NOTE — Progress Notes (Signed)
Report received from pacu at 1500 and pt arrived to the unit at 1515 via bed with IV intact and transfusing. Pt A&O x4; telemetry applied and verified; VSS; surgical dsg to right foot remains clean dry and intact. Pt neuro vascular check intact. Pt in bed comfortably with call light within reach and family at bedside. Will closely monitor pt. Dionne Bucy RN

## 2015-05-19 NOTE — Transfer of Care (Signed)
Immediate Anesthesia Transfer of Care Note  Patient: Allison Beasley  Procedure(s) Performed: Procedure(s): AMPUTATION DIGIT (Right)  Patient Location: PACU  Anesthesia Type:General  Level of Consciousness: awake, alert  and oriented  Airway & Oxygen Therapy: Patient Spontanous Breathing and Patient connected to nasal cannula oxygen  Post-op Assessment: Report given to RN and Post -op Vital signs reviewed and stable  Post vital signs: Reviewed and stable  Last Vitals:  Filed Vitals:   05/19/15 0755  BP: 151/76  Pulse: 86  Temp: 36.6 C  Resp: 16    Complications: No apparent anesthesia complications

## 2015-05-19 NOTE — Anesthesia Procedure Notes (Signed)
Procedure Name: LMA Insertion Date/Time: 05/19/2015 9:26 AM Performed by: Leonel Ramsay Pre-anesthesia Checklist: Patient identified, Timeout performed, Emergency Drugs available, Suction available and Patient being monitored Patient Re-evaluated:Patient Re-evaluated prior to inductionOxygen Delivery Method: Circle system utilized Preoxygenation: Pre-oxygenation with 100% oxygen Intubation Type: IV induction LMA: LMA inserted LMA Size: 4.0 Number of attempts: 1 Placement Confirmation: positive ETCO2 and breath sounds checked- equal and bilateral Tube secured with: Tape Dental Injury: Teeth and Oropharynx as per pre-operative assessment

## 2015-05-19 NOTE — Anesthesia Postprocedure Evaluation (Signed)
  Anesthesia Post-op Note  Patient: Allison Beasley  Procedure(s) Performed: Procedure(s): AMPUTATION DIGIT (Right)  Patient Location: PACU  Anesthesia Type:General  Level of Consciousness: awake and alert   Airway and Oxygen Therapy: Patient Spontanous Breathing  Post-op Pain: moderate  Post-op Assessment: Post-op Vital signs reviewed and Patient's Cardiovascular Status Stable              Post-op Vital Signs: stable  Last Vitals:  Filed Vitals:   05/19/15 1225  BP:   Pulse:   Temp: 36.2 C  Resp:     Complications: No apparent anesthesia complications

## 2015-05-19 NOTE — Anesthesia Preprocedure Evaluation (Signed)
Anesthesia Evaluation  Patient identified by MRN, date of birth, ID band Patient awake    Reviewed: Allergy & Precautions, NPO status , Patient's Chart, lab work & pertinent test results  Airway Mallampati: II       Dental   Pulmonary shortness of breath,  breath sounds clear to auscultation        Cardiovascular hypertension, + Peripheral Vascular Disease Rhythm:Regular Rate:Normal     Neuro/Psych Seizures -,   Neuromuscular disease    GI/Hepatic GERD-  ,  Endo/Other  diabetes  Renal/GU      Musculoskeletal  (+) Arthritis -, Fibromyalgia -  Abdominal   Peds  Hematology   Anesthesia Other Findings   Reproductive/Obstetrics                             Anesthesia Physical Anesthesia Plan  ASA: III  Anesthesia Plan: General   Post-op Pain Management:    Induction: Intravenous  Airway Management Planned: LMA  Additional Equipment:   Intra-op Plan:   Post-operative Plan: Extubation in OR  Informed Consent: I have reviewed the patients History and Physical, chart, labs and discussed the procedure including the risks, benefits and alternatives for the proposed anesthesia with the patient or authorized representative who has indicated his/her understanding and acceptance.   Dental advisory given  Plan Discussed with: CRNA and Surgeon  Anesthesia Plan Comments:         Anesthesia Quick Evaluation

## 2015-05-19 NOTE — Interval H&P Note (Signed)
History and Physical Interval Note:  05/19/2015 7:25 AM  Allison Beasley  has presented today for surgery, with the diagnosis of Peripheral Arterial Disease with ulcer right foot I70.235 Right great toe pain M79.674  The various methods of treatment have been discussed with the patient and family. After consideration of risks, benefits and other options for treatment, the patient has consented to  Procedure(s): AMPUTATION DIGIT (Right) as a surgical intervention .  The patient's history has been reviewed, patient examined, no change in status, stable for surgery.  I have reviewed the patient's chart and labs.  Questions were answered to the patient's satisfaction.     Fabienne Bruns

## 2015-05-19 NOTE — Progress Notes (Signed)
Patient blood glucose 48, notified Dr. Jacklynn Bue, new orders received. Will continue to monitor closely.

## 2015-05-19 NOTE — Progress Notes (Signed)
Orthopedic Tech Progress Note Patient Details:  Allison Beasley [birth year] [id number] Fit pt. for Darco shoe.  Left shoe at bedside for use when pt. Is out of bed. Ortho Devices Type of Ortho Device: Darco shoe Ortho Device/Splint Location: RLE Ortho Device/Splint Interventions: Other (comment)   Lesle Chris 05/19/2015, 4:02 PM

## 2015-05-19 NOTE — H&P (View-Only) (Signed)
[facility] [place] HISTORY AND PHYSICAL -PAD  History of Present Illness Allison Beasley is a 54 y.o. female patient of Dr. 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 On 07/25/14 Dr. Darrick Penna performed an aortogram with bilateral run off. 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 I have offered the patient an amputation of her toe for pain control if she wishes at some point future. She will call if she wishes to have this done at some point.  She returns today with c/o right great toe pain that is worsening, ulcerations of the medial and plantar aspects of the right great toe are worsening, per pt. She has apparently chosen to have her right great toe amputated in hopes of alleviating this pain.  Her current treatment of her right great toe ulcers is washing with soap and water and applying an orange/brown ointment and dressing daily to the toe; she does not recall the name of the ointment, states this was the extent of the wound care she was receiving in the wound care clinic and she is continuing this at home.  She has been hospitalized at [facility] visiting her daughter due to exhaustion from polymyositis after her plane trip.  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 She also has Raynaud's Syndrome and RA. Before her right leg started hurting she was walking almost 2 miles daily. She is still having right leg pain, wonders if this is due to the way she walks trying to avoid pain in the right toe. Dr. Yates Decamp is her podiatrist, [facility] in [place].   Pt Diabetic: Yes, secondary to corticosteroid use, states is fairly good control Pt smoker: non-smoker  Pt meds include: Statin :No, states her cholesterol is good Betablocker: Yes ASA: No Other anticoagulants/antiplatelets: no   Past Medical History   Diagnosis Date  . Osteoarthritis   . Polymyositis   . Fibromyalgia   . Osteopenia   . Raynaud phenomenon   . Polymyositis   . Constipation   . Shortness of breath   . Diabetes mellitus without complication   . GERD (gastroesophageal reflux disease)   . Headache(784.0)   . Anemia     Social History History  Substance Use Topics  . Smoking status: Never Smoker   . Smokeless tobacco: Never Used  . Alcohol Use: No     Comment: occasional use only    Family History Family History  Problem Relation Age of Onset  . Diabetes Mother   . Hypertension Mother   . Diabetes Father   . Heart disease Father   . Hypertension Sister   . Heart disease Sister     Past Surgical History  Procedure Laterality Date  . Cesarean section      x3  . Tubal ligation    . Hysteroscopy      w/ D&C with excision of endometrial polyps  . Left great toenail removal  Left 2013    done by [place] [facility]  . Tonsillectomy Bilateral   . Amputation Left 12/24/2012    Procedure: AMPUTATION DIGIT left great toe;  Surgeon: Sherren Kerns, MD;  Location: [facility] OR;  Service: Vascular;  Laterality: Left;  GREAT  . Abdominal aortagram N/A 07/25/2014    Procedure: ABDOMINAL Ronny Flurry;  Surgeon: Sherren Kerns, MD;  Location: [facility] CATH LAB;  Service: Cardiovascular;  Laterality: N/A;  Allergies  Allergen Reactions  . Tape Itching    Plastic tape causes itching-USE PAPER TAPE ONLY    Current Outpatient Prescriptions  Medication Sig Dispense Refill  . amLODipine (NORVASC) 10 MG tablet Take 10 mg by mouth daily.    . cycloSPORINE modified (NEORAL) 25 MG capsule Take 50 mg by mouth 2 (two) times daily.    . diphenhydrAMINE (BENADRYL) 25 MG tablet Take 1 tablet (25 mg total) by mouth 3 (three) times daily. 10 tablet 0  . escitalopram (LEXAPRO) 10 MG tablet Take 10 mg by mouth daily.    . famotidine (PEPCID) 20 MG tablet Take 1 tablet (20 mg total) by mouth 2 (two) times daily. 6 tablet 0  . omeprazole  (PRILOSEC) 40 MG capsule Take 40 mg by mouth daily.     . oxyCODONE-acetaminophen (PERCOCET/ROXICET) 5-325 MG per tablet Take 1 tablet by mouth every 6 (six) hours as needed for severe pain. 15 tablet 0  . Pseudoephedrine-Ibuprofen ([employer] COLD & SINUS RELIEF) 30-200 MG TABS Take 1 tablet by mouth daily as needed (for sinuses).     No current facility-administered medications for this visit.    ROS: See HPI for pertinent positives and negatives.   Physical Examination  Filed Vitals:   05/07/15 0915  BP: 129/91  Pulse: 92  Temp: 97.9 F (36.6 C)  TempSrc: Oral  Resp: 16  Height: 5' 1" (1.549 m)  Weight: 134 lb (60.782 kg)  SpO2: 99%   Body mass index is 25.33 kg/(m^2).   General: A&O x 3, WDWN. Gait: normal Eyes: PERRLA. Pulmonary: CTAB, without wheezes , rales or rhonchi. Cardiac: regular Rythm , without detected murmur.     Carotid Bruits Left Right   Negative Negative  Aorta is not palpable. Radial pulses: right radial is 2+ palpable, left radial is not palpable, left brachial is 2+ palpable   VASCULAR EXAM: Extremities with ischemic changes in right great toe:  3 small ulcers at medial aspect right great toe, one is weeping scant serous drainage, darkening of plantar aspect right great toe. Left great toe is surgically absent.  Right plantar arch pulse is audible by Doppler.      LE Pulses LEFT RIGHT   FEMORAL 3+ palpable 3+ palpable    POPLITEAL not palpable  not palpable   POSTERIOR TIBIAL not palpable, audible by Doppler  not palpable, audible by Doppler    DORSALIS PEDIS  ANTERIOR TIBIAL not palpable, audible by Doppler  not palpable, audible by Doppler    Abdomen: soft, NT, no palpable masses. Skin: no rashes, see  Extremities. Musculoskeletal: no muscle wasting or atrophy.Multiple areas of nodular calcium deposits including both axillae, lateral aspects both upper thighs, lower mid back. Neurologic: A&O X 3; Appropriate Affect,; MOTOR FUNCTION: moving all extremities equally, 5/5 muscle strength in all extremities. Speech is fluent/normal. CN 2-12 intact.         ASSESSMENT: Allison Beasley is a 53 y.o. 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 On 07/25/14 Dr. Fields performed an aortogram with bilateral run off. 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 I have offered the patient an amputation of her toe for pain control if she wishes at some point future. She will call if she wishes to have this done at some point.  She returns today with c/o right great toe pain that is worsening, ulcerations of the medial and plantar aspects of the right   great toe are worsening, per pt. She has apparently chosen to have her right great toe amputated in hopes of alleviating this pain.  Her current treatment of her right great toe ulcers is washing with soap and water and applying iodosorb iodine gel and dressing daily to the toe, states this was the extent of the wound care she was receiving in the wound care clinic and she is continuing this at home.   Dr. Darrick Penna spoke with and examined pt. Dr. Darrick Penna would like to schedule an arteriogram for tomorrow, but pt states her daughter is not available to drive her until next week.  Face to face time with patient was 25 minutes. Over 50% of this time was spent on counseling and coordination of care.   PLAN:  Based on the patient's vascular studies and examination, pt will be scheduled for arteriogram with Dr. Darrick Penna for next week, possible right great toe amputation.  I discussed in depth with the patient the nature of atherosclerosis, and emphasized the  importance of maximal medical management including strict control of blood pressure, blood glucose, and lipid levels, obtaining regular exercise, and continued cessation of smoking.  The patient is aware that without maximal medical management the underlying atherosclerotic disease process will progress, limiting the benefit of any interventions.  The patient was given information about PAD including signs, symptoms, treatment, what symptoms should prompt the patient to seek immediate medical care, and risk reduction measures to take.  Charisse March, RN, MSN, FNP-C [facility] Phone: [phone]  Clinic MD: Darrick Penna  05/07/2015 9:15 AM

## 2015-05-19 NOTE — Progress Notes (Signed)
Emesis x1, prn zofran administered. Pt in bed with family at bedside; surgical dsg remains intact with no active drainage, stain or bleeding noted. Will report off to oncoming RN. Arabella Merles Velita Quirk RN.

## 2015-05-20 ENCOUNTER — Encounter (HOSPITAL_COMMUNITY): Payer: Self-pay | Admitting: Vascular Surgery

## 2015-05-20 DIAGNOSIS — M868X7 Other osteomyelitis, ankle and foot: Secondary | ICD-10-CM | POA: Diagnosis not present

## 2015-05-20 LAB — GLUCOSE, CAPILLARY
GLUCOSE-CAPILLARY: 92 mg/dL (ref 65–99)
GLUCOSE-CAPILLARY: 101 mg/dL — AB (ref 65–99)

## 2015-05-20 MED ORDER — HYDROCODONE-ACETAMINOPHEN 5-325 MG PO TABS
1.0000 | ORAL_TABLET | Freq: Two times a day (BID) | ORAL | Status: DC
  Administered 2015-05-20: 2 via ORAL
  Filled 2015-05-20: qty 2

## 2015-05-20 MED ORDER — HYDROCODONE-ACETAMINOPHEN 5-325 MG PO TABS: 1.0000 | ORAL_TABLET | Freq: Four times a day (QID) | ORAL | Status: DC | PRN

## 2015-05-20 MED ORDER — HYDROCODONE-ACETAMINOPHEN 5-325 MG PO TABS: 1.0000 | ORAL_TABLET | Freq: Two times a day (BID) | ORAL | Status: DC

## 2015-05-20 MED ORDER — HYDROCODONE-ACETAMINOPHEN 5-325 MG PO TABS
1.0000 | ORAL_TABLET | Freq: Four times a day (QID) | ORAL | Status: DC | PRN
  Administered 2015-05-20: 1 via ORAL
  Filled 2015-05-20: qty 1

## 2015-05-20 NOTE — Progress Notes (Signed)
Pt discharge education and instructions completed with pt at beside, pt voices understanding and denies any questions. Pt dsg changed prior to discharge as dsg was stained. Pt IV and telemetry removed. Pt handed her prescription for Vicodin; pt discharge home with family to transport her home. Pt awaiting on family to transport her home. Will continue to monitor till pick up. P. Amo Madonna Flegal RN.

## 2015-05-20 NOTE — Progress Notes (Signed)
Checked on pt this afternoon and she has gotten much better relief with the Vicodin.    We will discharge her home today and she will f/u with Dr. Darrick Penna in 2 weeks.  Doreatha Massed 05/20/2015 3:34 PM

## 2015-05-20 NOTE — Discharge Summary (Signed)
Discharge Summary    Allison Beasley Mar 28, 1961 54 y.o. female  [id number]  Admission Date: 05/19/2015  Discharge Date: 05/20/15  Physician: Sherren Kerns, MD  Admission Diagnosis: Peripheral Arterial Disease with ulcer right foot I70.235 Right great toe pain M79.674   HPI:   This is a 54 y.o. female patient of Dr. 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 On 07/25/14 Dr. Darrick Penna performed an aortogram with bilateral run off. 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 I have offered the patient an amputation of her toe for pain control if she wishes at some point future. She will call if she wishes to have this done at some point.  She returns today with c/o right great toe pain that is worsening, ulcerations of the medial and plantar aspects of the right great toe are worsening, per pt. She has apparently chosen to have her right great toe amputated in hopes of alleviating this pain.  Her current treatment of her right great toe ulcers is washing with soap and water and applying an orange/brown ointment and dressing daily to the toe; she does not recall the name of the ointment, states this was the extent of the wound care she was receiving in the wound care clinic and she is continuing this at home.  She has been hospitalized at [facility] visiting her daughter due to exhaustion from polymyositis after her plane trip.  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 She also has Raynaud's Syndrome and RA. Before her right leg started hurting she was walking almost 2 miles daily. She is still having right leg pain, wonders if this is due to the way she walks trying to avoid pain in the right toe. Dr. Yates Decamp is her podiatrist, [facility] in [place].   Hospital Course:  The patient was admitted to the hospital and taken to  the operating room on 05/19/2015 and underwent: Right great toe amputation.    The pt tolerated the procedure well and was transported to the PACU in good condition.   By POD 1, her incision was clean and dry.  Her pain was not well controlled as the OxyIR made her itch and the one Vicodin was not controlling her pain.  Her Vicodin was increased to 1-2 every 6 hours as needed for pain.  This was much more effective and her pain was controlled by the afternoon and she is discharged home.  The remainder of the hospital course consisted of increasing mobilization and increasing intake of solids without difficulty.  CBC    Component Value Date/Time   WBC 7.6 05/19/2015 1936   RBC 4.19 05/19/2015 1936   HGB 12.3 05/19/2015 1936   HCT 37.9 05/19/2015 1936   PLT 257 05/19/2015 1936   MCV 90.5 05/19/2015 1936   MCH 29.4 05/19/2015 1936   MCHC 32.5 05/19/2015 1936   RDW 15.4 05/19/2015 1936   LYMPHSABS 1.5 05/02/2015 1808   MONOABS 0.3 05/02/2015 1808   EOSABS 0.2 05/02/2015 1808   BASOSABS 0.0 05/02/2015 1808    BMET    Component Value Date/Time   NA 144 05/15/2015 0632   K 3.9 05/15/2015 0632   CL 106 05/15/2015 0632   CO2 22 05/02/2015 1808   GLUCOSE 87 05/15/2015 0632   BUN 12 05/15/2015 0632   CREATININE 0.56 05/19/2015 1936   CALCIUM 8.5* 05/02/2015 1808   GFRNONAA >  60 05/19/2015 1936   GFRAA >60 05/19/2015 1936      Discharge Instructions    Call MD for:  redness, tenderness, or signs of infection (pain, swelling, bleeding, redness, odor or green/yellow discharge around incision site)    Complete by:  As directed      Call MD for:  severe or increased pain, loss or decreased feeling  in affected limb(s)    Complete by:  As directed      Call MD for:  temperature >100.5    Complete by:  As directed      Discharge instructions    Complete by:  As directed   Dry dressing to right foot with telfa (non adherent dressing), gauze and kerlix daily.  Heel weight bearing only  with Darco shoe.     Discharge wound care:    Complete by:  As directed   Dry dressing to wound daily.     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 Toe infection 05/19/2015  . Pain of great toe 05/07/2015  . Peripheral vascular disease, unspecified 04/24/2014  . Pain of right lower extremity 04/10/2014  . Sensation of cold in leg- Right Leg and foot 04/10/2014  . Open wound of great toe 03/21/2013  . Aftercare following surgery of the circulatory system, NEC 02/14/2013  . 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 Atherosclerosis of native arteries of the extremities with gangrene 01/24/2013  . Dyspnea 01/13/2013  . Elevated troponin borderline -- no true elevation, resolved. 01/13/2013  . Raynaud's disease 01/13/2013  . Polymyositis 01/13/2013  . Fibromyalgia 01/13/2013   Past Medical History  Diagnosis Date  . Osteoarthritis   . Osteopenia   . Raynaud phenomenon   . Constipation   . Shortness of breath   . GERD (gastroesophageal reflux disease)   . Headache(784.0)   . Anemia   . Hypertension   . Seizures     with chid birth 14f 2 childre  . Peripheral vascular disease   . Depression   . Anxiety   . Type II diabetes mellitus   . Polymyositis   . Fibromyalgia   . Pneumonia 03/2012       Medication List    STOP taking these medications        oxyCODONE-acetaminophen 5-325 MG per tablet  Commonly known as:  PERCOCET/ROXICET      TAKE these medications        amLODipine 10 MG tablet  Commonly known as:  NORVASC  Take 10 mg by mouth daily.     cycloSPORINE modified 25 MG capsule  Commonly known as:  NEORAL  Take 50 mg by  mouth 2 (two) times daily.     diphenhydrAMINE 25 MG tablet  Commonly known as:  BENADRYL  Take 25 mg by mouth 2 (two) times daily as needed for itching.     escitalopram 10 MG tablet  Commonly known as:  LEXAPRO  Take 10 mg by mouth daily.     HYDROcodone-acetaminophen 5-325 MG per tablet  Commonly known as:  NORCO/VICODIN  Take 1-2 tablets by mouth  every 6 (six) hours as needed for moderate pain. scheduled     naproxen sodium 220 MG tablet  Commonly known as:  ANAPROX  Take 440 mg by mouth 2 (two) times daily as needed (pain). ALEVE     omeprazole 40 MG capsule  Commonly known as:  PRILOSEC  Take 40 mg by mouth daily.     predniSONE 10 MG tablet  Commonly known as:  DELTASONE  Take 10 mg by mouth daily with breakfast.     SINUS/ALLERGY PE PO  Take 1 tablet by mouth 2 (two) times daily as needed (sinus drainage).        Prescriptions given: Vicodin #30 No Refill  Instructions: 1.  Heel weight bearing only with Darco shoe right foot. 2.  Dry dressing to right great toe amputation site daily.  Disposition: home  Patient's condition: is Good  Follow up: 1. Dr. Darrick Penna in 2 weeks   Doreatha Massed, PA-C [facility] [phone] 05/20/2015  3:36 PM

## 2015-05-20 NOTE — Progress Notes (Addendum)
  Progress Note    05/20/2015 7:39 AM 1 Day Post-Op  Subjective:  Having pain - did not take any percocet since MN as it has made her itch and the Vicodin wasn't strong enough.  Afebrile HR 70's-80's NSR 130's-150's systolic 100% 2LO2NC  Filed Vitals:   05/20/15 0423  BP: 142/84  Pulse: 85  Temp: 98.6 F (37 C)  Resp: 18    Physical Exam: Lungs:  Non labored Incisions:  Clean and dry with sutures in tact.   CBC    Component Value Date/Time   WBC 7.6 05/19/2015 1936   RBC 4.19 05/19/2015 1936   HGB 12.3 05/19/2015 1936   HCT 37.9 05/19/2015 1936   PLT 257 05/19/2015 1936   MCV 90.5 05/19/2015 1936   MCH 29.4 05/19/2015 1936   MCHC 32.5 05/19/2015 1936   RDW 15.4 05/19/2015 1936   LYMPHSABS 1.5 05/02/2015 1808   MONOABS 0.3 05/02/2015 1808   EOSABS 0.2 05/02/2015 1808   BASOSABS 0.0 05/02/2015 1808    BMET    Component Value Date/Time   NA 144 05/15/2015 0632   K 3.9 05/15/2015 0632   CL 106 05/15/2015 0632   CO2 22 05/02/2015 1808   GLUCOSE 87 05/15/2015 0632   BUN 12 05/15/2015 0632   CREATININE 0.56 05/19/2015 1936   CALCIUM 8.5* 05/02/2015 1808   GFRNONAA >60 05/19/2015 1936   GFRAA >60 05/19/2015 1936    INR    Component Value Date/Time   INR 1.02 01/13/2013 1820     Intake/Output Summary (Last 24 hours) at 05/20/15 0739 Last data filed at 05/19/15 2130  Gross per 24 hour  Intake   2235 ml  Output    860 ml  Net   1375 ml     Assessment:  54 y.o. female is s/p:  Right great toe amputation  1 Day Post-Op  Plan: -pt's pain is not well controlled.  Percocet is making her itch-will increase her Vicodin to 1-2 tablets instead of one tablet. -will give her morphine this morning to help get pain under control  -wound looks good and is clean and dry. Redressed  -DVT prophylaxis:  Lovenox -if pain controlled later today, will d/c -heel weight bearing only with Darco shoe, which is in the room   Doreatha Massed, PA-C [facility] [phone] 05/20/2015 7:39 AM    Agree with above.  D/c home later today  Fabienne Bruns, MD [facility] [place] Office: [phone] Pager: [phone]

## 2015-05-21 ENCOUNTER — Telehealth: Payer: Self-pay | Admitting: Vascular Surgery

## 2015-05-21 ENCOUNTER — Telehealth: Payer: Self-pay

## 2015-05-21 DIAGNOSIS — G8918 Other acute postprocedural pain: Secondary | ICD-10-CM

## 2015-05-21 MED ORDER — HYDROCODONE-ACETAMINOPHEN 10-325 MG PO TABS: 1.0000 | ORAL_TABLET | Freq: Four times a day (QID) | ORAL | Status: DC | PRN

## 2015-05-21 NOTE — Telephone Encounter (Signed)
-----   Message from Sharee Pimple, RN sent at 05/19/2015 10:22 AM EDT ----- Regarding: Schedule   ----- Message -----    From: Dara Lords, PA-C    Sent: 05/19/2015  10:20 AM      To: [facility] Charge Pool  S/p right great toe amp 05/19/15.  F/u with Dr. Darrick Penna in 2 weeks.  Thanks, [employer]

## 2015-05-21 NOTE — Telephone Encounter (Signed)
Spoke with pt re appt, dpm  °

## 2015-05-21 NOTE — Telephone Encounter (Signed)
Phone call from pt. Reported the Hydrocodone/ Acetaminophen 5/325 mg  Isn't helping her pain.  Stated last night she had to take an extra dose, prior to the time it was due to help relieve the pain enough to go to sleep.  Reported she ended up taking 4 tablets in a shorter time span, than the recommended dosing. Rated her pain at "8/10".  C/o intermittent sharp pain that "feels like lightening strikes."  Is requesting a stronger pain medication.  Discussed with Dr. Darrick Penna. Recommended to order Hydrocodone/acetaminophen 10/325 mg tablets; # 30; no refills.

## 2015-05-22 ENCOUNTER — Telehealth: Payer: Self-pay

## 2015-05-22 NOTE — Telephone Encounter (Signed)
Phone call from pt.  Stated she was in excruciating pain last night in Right great toe amp. Site.  Reported she took a pain pill @ 10:00 PM, but due to the intensity of the pain, she called the MD on call.  Stated she was advised to go to the ER.  Reported she didn't go to the ER, but waited until she could take next dose of Hydrocodone/ Acetaminophen 10/325 mg @ 3:00 AM; took 1 tablet.  Reported she took next dose at 9:00 AM this morning, and is feeling better.  Described the pain "like spasms that starts in the surgery site and travels up the leg and makes her head hurt.  Reported she changed her bandage yesterday morning, and noted dried bloody drainage on the dressing, "some swelling" at the amputation site; incision intact; denied active bleeding or drainage.  Denied any fever/ chills.  Reported her BP this AM is 174/88.  Rated her pain at a "5/10", compared to 8-9/10 last night.  Advised to elevate her right lower extremity @ intervals during the day and evening to help prevent worsening of swelling.  Advised that if her pain gets excruciating and unmanageable, she should go to the ER.  Verb. Understanding.

## 2015-05-29 ENCOUNTER — Telehealth: Payer: Self-pay | Admitting: *Deleted

## 2015-05-29 NOTE — Telephone Encounter (Signed)
Patient called to report that she ran out of the hydrocodone/APAP 10-325 mg. She is not having as much pain now and wishes to take the 5-325 that she has left over from her Rx on 05-21-15. She is still afebrile; swelling at her amputation site is less than what she reported on 05-22-15.  I told her that this would be fine. She will keep her appt with Dr. Darrick Penna on 06-04-15. She voiced understanding and agreement with this plan.

## 2015-06-03 ENCOUNTER — Encounter: Payer: Self-pay | Admitting: Vascular Surgery

## 2015-06-04 ENCOUNTER — Encounter: Payer: Self-pay | Admitting: Vascular Surgery

## 2015-06-04 ENCOUNTER — Ambulatory Visit (INDEPENDENT_AMBULATORY_CARE_PROVIDER_SITE_OTHER): Payer: Self-pay | Admitting: Vascular Surgery

## 2015-06-04 VITALS — BP 152/83 | HR 78 | Temp 98.6°F | Ht 61.0 in | Wt 135.2 lb

## 2015-06-04 DIAGNOSIS — I739 Peripheral vascular disease, unspecified: Secondary | ICD-10-CM

## 2015-06-04 MED ORDER — HYDROCODONE-ACETAMINOPHEN 5-325 MG PO TABS: 1.0000 | ORAL_TABLET | Freq: Four times a day (QID) | ORAL | Status: DC | PRN

## 2015-06-04 NOTE — Progress Notes (Signed)
Patient is a 54 year old female who is status post amputation of right first toe on July 26. She still is having a little bit of serous drainage from the toe amputation site. Pain is improved overall.  Physical exam:  Filed Vitals:   06/04/15 0828 06/04/15 0831  BP: 145/89 152/83  Pulse: 78   Temp: 98.6 F (37 C)   TempSrc: Oral   Height: 5\' 1"  (1.549 m)   Weight: 135 lb 3.2 oz (61.326 kg)   SpO2: 100%     Extremities: Right first toe amputation site skin edges well approximated small amount of serous drainage on the medial aspect no erythema 1.5 cm ulceration dorsum of foot clean and granulating  Assessment: Healing toe amputation site probably still need sutures in several more weeks. Follow-up in 3 weeks to consider suture removal.  , MD [facility] [place] Office: [phone] Pager: [phone]

## 2015-06-24 ENCOUNTER — Encounter: Payer: Self-pay | Admitting: Vascular Surgery

## 2015-06-25 ENCOUNTER — Encounter: Payer: Self-pay | Admitting: Vascular Surgery

## 2015-06-25 ENCOUNTER — Ambulatory Visit (INDEPENDENT_AMBULATORY_CARE_PROVIDER_SITE_OTHER): Payer: Self-pay | Admitting: Vascular Surgery

## 2015-06-25 ENCOUNTER — Ambulatory Visit (HOSPITAL_COMMUNITY)
Admission: RE | Admit: 2015-06-25 | Discharge: 2015-06-25 | Disposition: A | Discharge status: Home / Self Care | Payer: BLUE CROSS/BLUE SHIELD | Source: Ambulatory Visit | Attending: Vascular Surgery | Admitting: Vascular Surgery

## 2015-06-25 VITALS — BP 129/81 | HR 98 | Temp 99.1°F | Ht 61.0 in | Wt 137.7 lb

## 2015-06-25 DIAGNOSIS — R609 Edema, unspecified: Secondary | ICD-10-CM

## 2015-06-25 DIAGNOSIS — I10 Essential (primary) hypertension: Secondary | ICD-10-CM | POA: Diagnosis not present

## 2015-06-25 DIAGNOSIS — E119 Type 2 diabetes mellitus without complications: Secondary | ICD-10-CM | POA: Insufficient documentation

## 2015-06-25 DIAGNOSIS — Z89411 Acquired absence of right great toe: Secondary | ICD-10-CM | POA: Diagnosis not present

## 2015-06-25 NOTE — Progress Notes (Signed)
Patient is a 54 year old female who returns today postoperative follow-up after a toe amputation on the right first toe. She denies any drainage or redness from the wound. She does complain of some warmth and pain in her right knee. She has had some swelling in the right leg.  Physical exam:  Filed Vitals:   06/25/15 1126  BP: 129/81  Pulse: 98  Temp: 99.1 F (37.3 C)  TempSrc: Oral  Height: 5\' 1"  (1.549 m)  Weight: 137 lb 11.2 oz (62.46 kg)  SpO2: 98%    Right lower extremity: Healing right first toe amputation sutures removed today, right knee warm on palpation slightly tender to palpation around the patella no obvious effusion no erythema  Data: Patient had an ultrasound of her right leg today which showed no evidence of DVT  Assessment: Healing first toe amputation site. Right knee pain with warmth consistent with inflammatory arthritis possibly related to her polymyalgia rheumatica  Plan: Follow-up as needed for right first toe amputation site if any wound healing problems #2 patient will take non-steroidal any inflammatory support her right knee if this is not improved in a few days she will call her rheumatologist for appointment for further evaluation.  , MD [facility] [place] Office: [phone] Pager: [phone]

## 2015-07-20 ENCOUNTER — Telehealth: Payer: Self-pay

## 2015-07-20 NOTE — Telephone Encounter (Signed)
Phone call from pt.  C/o bulge on top right foot just below the great toe amputation site.  Stated the bulge is the size of a thumbnail, and has worsened over the 3 week interval she has noticed it.   Also stated that the area has become more tender, and has a "dark black spot in middle of it."  Reported fever this AM of 101 degrees.  Denied any drainage.  Stated she didn't want to go to the Emergency RM, since the last time she went, "they didn't know what to do about the problem."   Appt. Given for 12:00 PM with Dr. Arbie Cookey 9/27.  Advised if she feels worse tonight, she should go to the ER.  Stated "I'm just going to wait until tomorrow."

## 2015-07-21 ENCOUNTER — Inpatient Hospital Stay (HOSPITAL_COMMUNITY)
Admission: AD | Admit: 2015-07-21 | Discharge: 2015-07-25 | DRG: 504 | Disposition: A | Discharge status: Home Health | Payer: BLUE CROSS/BLUE SHIELD | Source: Ambulatory Visit | Attending: Vascular Surgery | Admitting: Vascular Surgery

## 2015-07-21 ENCOUNTER — Encounter: Payer: Self-pay | Admitting: Vascular Surgery

## 2015-07-21 ENCOUNTER — Ambulatory Visit (INDEPENDENT_AMBULATORY_CARE_PROVIDER_SITE_OTHER): Payer: Medicare Other | Admitting: Vascular Surgery

## 2015-07-21 ENCOUNTER — Ambulatory Visit: Payer: Self-pay | Admitting: Vascular Surgery

## 2015-07-21 VITALS — BP 114/71 | HR 103 | Temp 101.3°F | Resp 20 | Ht 61.0 in | Wt 133.1 lb

## 2015-07-21 DIAGNOSIS — I739 Peripheral vascular disease, unspecified: Secondary | ICD-10-CM | POA: Diagnosis present

## 2015-07-21 DIAGNOSIS — F419 Anxiety disorder, unspecified: Secondary | ICD-10-CM | POA: Diagnosis present

## 2015-07-21 DIAGNOSIS — B999 Unspecified infectious disease: Secondary | ICD-10-CM | POA: Diagnosis present

## 2015-07-21 DIAGNOSIS — I10 Essential (primary) hypertension: Secondary | ICD-10-CM | POA: Diagnosis present

## 2015-07-21 DIAGNOSIS — I70261 Atherosclerosis of native arteries of extremities with gangrene, right leg: Secondary | ICD-10-CM | POA: Diagnosis present

## 2015-07-21 DIAGNOSIS — M1990 Unspecified osteoarthritis, unspecified site: Secondary | ICD-10-CM | POA: Diagnosis present

## 2015-07-21 DIAGNOSIS — D649 Anemia, unspecified: Secondary | ICD-10-CM | POA: Diagnosis present

## 2015-07-21 DIAGNOSIS — E1152 Type 2 diabetes mellitus with diabetic peripheral angiopathy with gangrene: Secondary | ICD-10-CM | POA: Diagnosis present

## 2015-07-21 DIAGNOSIS — Y835 Amputation of limb(s) as the cause of abnormal reaction of the patient, or of later complication, without mention of misadventure at the time of the procedure: Secondary | ICD-10-CM | POA: Diagnosis present

## 2015-07-21 DIAGNOSIS — F329 Major depressive disorder, single episode, unspecified: Secondary | ICD-10-CM | POA: Diagnosis present

## 2015-07-21 DIAGNOSIS — Z79899 Other long term (current) drug therapy: Secondary | ICD-10-CM | POA: Diagnosis not present

## 2015-07-21 DIAGNOSIS — M797 Fibromyalgia: Secondary | ICD-10-CM | POA: Diagnosis present

## 2015-07-21 DIAGNOSIS — L97519 Non-pressure chronic ulcer of other part of right foot with unspecified severity: Secondary | ICD-10-CM | POA: Diagnosis present

## 2015-07-21 DIAGNOSIS — T8789 Other complications of amputation stump: Principal | ICD-10-CM | POA: Diagnosis present

## 2015-07-21 DIAGNOSIS — K219 Gastro-esophageal reflux disease without esophagitis: Secondary | ICD-10-CM | POA: Diagnosis present

## 2015-07-21 DIAGNOSIS — T814XXA Infection following a procedure, initial encounter: Secondary | ICD-10-CM | POA: Diagnosis not present

## 2015-07-21 LAB — COMPREHENSIVE METABOLIC PANEL
ALBUMIN: 2.6 g/dL — AB (ref 3.5–5.0)
ALK PHOS: 61 U/L (ref 38–126)
ALT: 84 U/L — ABNORMAL HIGH (ref 14–54)
AST: 158 U/L — AB (ref 15–41)
Anion gap: 9 (ref 5–15)
BILIRUBIN TOTAL: 0.7 mg/dL (ref 0.3–1.2)
BUN: 8 mg/dL (ref 6–20)
CALCIUM: 7.7 mg/dL — AB (ref 8.9–10.3)
CO2: 22 mmol/L (ref 22–32)
CREATININE: 0.88 mg/dL (ref 0.44–1.00)
Chloride: 103 mmol/L (ref 101–111)
GFR calc Af Amer: >60 mL/min (ref >60)
GLUCOSE: 84 mg/dL (ref 65–99)
POTASSIUM: 4.1 mmol/L (ref 3.5–5.1)
Sodium: 134 mmol/L — ABNORMAL LOW (ref 135–145)
TOTAL PROTEIN: 7.1 g/dL (ref 6.5–8.1)

## 2015-07-21 LAB — PROTIME-INR
INR: 1.11 (ref 0.00–1.49)
PROTHROMBIN TIME: 14.5 s (ref 11.6–15.2)

## 2015-07-21 LAB — CBC
HEMATOCRIT: 33.3 % — AB (ref 36.0–46.0)
HEMOGLOBIN: 10.4 g/dL — AB (ref 12.0–15.0)
MCH: 27.8 pg (ref 26.0–34.0)
MCHC: 31.2 g/dL (ref 30.0–36.0)
MCV: 89 fL (ref 78.0–100.0)
Platelets: 266 10*3/uL (ref 150–400)
RBC: 3.74 MIL/uL — ABNORMAL LOW (ref 3.87–5.11)
RDW: 16.3 % — AB (ref 11.5–15.5)
WBC: 9.1 10*3/uL (ref 4.0–10.5)

## 2015-07-21 MED ORDER — HYDROCODONE-ACETAMINOPHEN 10-325 MG PO TABS: 1.0000 | ORAL_TABLET | Freq: Four times a day (QID) | ORAL | Status: DC | PRN

## 2015-07-21 MED ORDER — LABETALOL HCL 5 MG/ML IV SOLN: 10.0000 mg | INTRAVENOUS | Status: DC | PRN

## 2015-07-21 MED ORDER — PIPERACILLIN-TAZOBACTAM 3.375 G IVPB: 3.3750 g | Freq: Three times a day (TID) | INTRAVENOUS | Status: DC

## 2015-07-21 MED ORDER — ESCITALOPRAM OXALATE 10 MG PO TABS: 10.0000 mg | ORAL_TABLET | Freq: Every day | ORAL | Status: DC

## 2015-07-21 MED ORDER — SODIUM CHLORIDE 0.9 % IV SOLN
INTRAVENOUS | Status: DC
  Administered 2015-07-21: 21:00:00 via INTRAVENOUS

## 2015-07-21 MED ORDER — ONDANSETRON HCL 4 MG/2ML IJ SOLN
4.0000 mg | Freq: Four times a day (QID) | INTRAMUSCULAR | Status: DC | PRN
  Administered 2015-07-22 (×2): 4 mg via INTRAVENOUS
  Filled 2015-07-21: qty 2

## 2015-07-21 MED ORDER — ACETAMINOPHEN 325 MG RE SUPP: 325.0000 mg | RECTAL | Status: DC | PRN

## 2015-07-21 MED ORDER — PIPERACILLIN-TAZOBACTAM 3.375 G IVPB
3.3750 g | Freq: Three times a day (TID) | INTRAVENOUS | Status: DC
Start: 2015-07-22 — End: 2015-07-24
  Administered 2015-07-22 – 2015-07-24 (×7): 3.375 g via INTRAVENOUS
  Filled 2015-07-21 (×9): qty 50

## 2015-07-21 MED ORDER — ACETAMINOPHEN 325 MG PO TABS
325.0000 mg | ORAL_TABLET | ORAL | Status: DC | PRN
  Administered 2015-07-21 – 2015-07-22 (×2): 650 mg via ORAL
  Filled 2015-07-21 (×2): qty 2

## 2015-07-21 MED ORDER — PANTOPRAZOLE SODIUM 40 MG PO TBEC: 40.0000 mg | DELAYED_RELEASE_TABLET | Freq: Every day | ORAL | Status: DC

## 2015-07-21 MED ORDER — PANTOPRAZOLE SODIUM 20 MG PO TBEC: 40.0000 mg | DELAYED_RELEASE_TABLET | Freq: Every day | ORAL | Status: DC

## 2015-07-21 MED ORDER — AMLODIPINE BESYLATE 10 MG PO TABS
10.0000 mg | ORAL_TABLET | Freq: Every day | ORAL | Status: DC
Start: 2015-07-22 — End: 2015-07-22

## 2015-07-21 MED ORDER — GUAIFENESIN-DM 100-10 MG/5ML PO SYRP: 15.0000 mL | ORAL_SOLUTION | ORAL | Status: DC | PRN

## 2015-07-21 MED ORDER — DIPHENHYDRAMINE HCL 25 MG PO TABS
25.0000 mg | ORAL_TABLET | Freq: Two times a day (BID) | ORAL | Status: DC | PRN
Start: 2015-07-21 — End: 2015-07-25
  Administered 2015-07-23: 25 mg via ORAL
  Filled 2015-07-21: qty 1

## 2015-07-21 MED ORDER — HYDROCODONE-ACETAMINOPHEN 10-325 MG PO TABS
1.0000 | ORAL_TABLET | Freq: Four times a day (QID) | ORAL | Status: DC | PRN
  Administered 2015-07-21 – 2015-07-25 (×2): 1 via ORAL
  Filled 2015-07-21 (×2): qty 1

## 2015-07-21 MED ORDER — ENOXAPARIN SODIUM 30 MG/0.3ML ~~LOC~~ SOLN
30.0000 mg | SUBCUTANEOUS | Status: DC
  Filled 2015-07-21: qty 0.3

## 2015-07-21 MED ORDER — VANCOMYCIN HCL IN DEXTROSE 1-5 GM/200ML-% IV SOLN
1000.0000 mg | Freq: Once | INTRAVENOUS | Status: AC
  Administered 2015-07-21: 1000 mg via INTRAVENOUS
  Filled 2015-07-21: qty 200

## 2015-07-21 MED ORDER — PHENOL 1.4 % MT LIQD: 1.0000 | OROMUCOSAL | Status: DC | PRN

## 2015-07-21 MED ORDER — ALUM & MAG HYDROXIDE-SIMETH 200-200-20 MG/5ML PO SUSP: 15.0000 mL | ORAL | Status: DC | PRN

## 2015-07-21 MED ORDER — HYDRALAZINE HCL 20 MG/ML IJ SOLN: 5.0000 mg | INTRAMUSCULAR | Status: DC | PRN

## 2015-07-21 MED ORDER — METOPROLOL TARTRATE 1 MG/ML IV SOLN: 2.0000 mg | INTRAVENOUS | Status: DC | PRN

## 2015-07-21 MED ORDER — VANCOMYCIN HCL IN DEXTROSE 750-5 MG/150ML-% IV SOLN
750.0000 mg | Freq: Two times a day (BID) | INTRAVENOUS | Status: DC
  Administered 2015-07-22 – 2015-07-24 (×5): 750 mg via INTRAVENOUS
  Filled 2015-07-21 (×6): qty 150

## 2015-07-21 MED ORDER — VANCOMYCIN HCL IN DEXTROSE 1-5 GM/200ML-% IV SOLN
1000.0000 mg | Freq: Two times a day (BID) | INTRAVENOUS | Status: DC
Start: 2015-07-21 — End: 2015-07-21
  Filled 2015-07-21 (×2): qty 200

## 2015-07-21 MED ORDER — PIPERACILLIN-TAZOBACTAM 3.375 G IVPB 30 MIN
3.3750 g | Freq: Once | INTRAVENOUS | Status: AC
  Administered 2015-07-21: 3.375 g via INTRAVENOUS
  Filled 2015-07-21: qty 50

## 2015-07-21 MED ORDER — PREDNISONE 10 MG PO TABS: 10.0000 mg | ORAL_TABLET | Freq: Every day | ORAL | Status: DC

## 2015-07-21 MED ORDER — TRAMADOL HCL 50 MG PO TABS: 50.0000 mg | ORAL_TABLET | Freq: Four times a day (QID) | ORAL | Status: DC | PRN

## 2015-07-21 MED ORDER — POTASSIUM CHLORIDE CRYS ER 20 MEQ PO TBCR: 20.0000 meq | EXTENDED_RELEASE_TABLET | Freq: Once | ORAL | Status: DC

## 2015-07-21 MED ORDER — PIPERACILLIN-TAZOBACTAM 3.375 G IVPB
3.3750 g | Freq: Three times a day (TID) | INTRAVENOUS | Status: DC
  Filled 2015-07-21 (×2): qty 50

## 2015-07-21 NOTE — Progress Notes (Signed)
Patient presents today for concern regarding nonhealing of right great toe amputation site fevers and chills. She post right great toe amputation hospital 2 months ago. She has known severe digital occlusive disease. She initially had satisfactory healing of this. Was seen in our office by Dr. Fields on 06/25/2015 was some diffuse swelling but the appear to be healing satisfactorily.Seen thereafter she developed focal swelling on the medial aspect of her first metatarsal bone. I this has progressed with severe pain. She's been having fevers to 101. Also has had some drainage from the toe incisions.  Past Medical History  Diagnosis Date  . Osteoarthritis   . Osteopenia   . Raynaud phenomenon   . Constipation   . Shortness of breath   . GERD (gastroesophageal reflux disease)   . Headache(784.0)   . Anemia   . Hypertension   . Seizures     with chid birth 0f 2 childre  . Peripheral vascular disease   . Depression   . Anxiety   . Type II diabetes mellitus   . Polymyositis   . Fibromyalgia   . Pneumonia 03/2012  . Polymyositis     Social History  Substance Use Topics  . Smoking status: Never Smoker   . Smokeless tobacco: Never Used  . Alcohol Use: Yes     Comment: 05/19/2015 "might have a couple drinks/yr"    Family History  Problem Relation Age of Onset  . Diabetes Mother   . Hypertension Mother   . Diabetes Father   . Heart disease Father   . Hypertension Sister   . Heart disease Sister     Allergies  Allergen Reactions  . Clindamycin/Lincomycin Itching and Rash    Possible reaction (took clindamycin and cipro at the same time)  . Doxycycline Nausea Only  . Percocet [Oxycodone-Acetaminophen] Itching  . Tape Itching and Other (See Comments)    Plastic tape causes itching-USE PAPER TAPE ONLY  . Mycophenolate Diarrhea  . Ciprofloxacin Itching and Rash    Possible reaction (took clindamycin and cipro at the same time)      Current outpatient prescriptions:  .   acetaminophen (TYLENOL) 500 MG tablet, Take 500 mg by mouth every 6 (six) hours as needed., Disp: , Rfl:  .  amLODipine (NORVASC) 10 MG tablet, Take 10 mg by mouth daily., Disp: , Rfl:  .  Chlorpheniramine-Phenylephrine (SINUS/ALLERGY PE PO), Take 1 tablet by mouth 2 (two) times daily as needed (sinus drainage)., Disp: , Rfl:  .  escitalopram (LEXAPRO) 10 MG tablet, Take 10 mg by mouth daily., Disp: , Rfl:  .  omeprazole (PRILOSEC) 40 MG capsule, Take 40 mg by mouth daily. , Disp: , Rfl:  .  predniSONE (DELTASONE) 10 MG tablet, Take 10 mg by mouth daily with breakfast., Disp: , Rfl:  .  cycloSPORINE modified (NEORAL) 25 MG capsule, Take 50 mg by mouth 2 (two) times daily., Disp: , Rfl:  .  diphenhydrAMINE (BENADRYL) 25 MG tablet, Take 25 mg by mouth 2 (two) times daily as needed for itching., Disp: , Rfl:  .  HYDROcodone-acetaminophen (NORCO) 10-325 MG per tablet, Take 1 tablet by mouth every 6 (six) hours as needed. (Patient not taking: Reported on 07/21/2015), Disp: 30 tablet, Rfl: 0 .  HYDROcodone-acetaminophen (NORCO/VICODIN) 5-325 MG per tablet, Take 1 tablet by mouth every 6 (six) hours as needed. (Patient not taking: Reported on 07/21/2015), Disp: 15 tablet, Rfl: 0 .  naproxen sodium (ANAPROX) 220 MG tablet, Take 440 mg by mouth 2 (two)   times daily as needed (pain). ALEVE, Disp: , Rfl:   Filed Vitals:   07/21/15 1551  BP: 114/71  Pulse: 103  Temp: 101.3 F (38.5 C)  TempSrc: Oral  Resp: 20  Height: 5' 1" (1.549 m)  Weight: 133 lb 1.6 oz (60.374 kg)  SpO2: 99%    Body mass index is 25.16 kg/(m^2).      On physical exam he is uncomfortable from pain in her right foot. Is having some chills. Temperature is 101  Does have a area with a superficial skin ulceration over the medial aspect several centimeters proximal to the old incision. There is some serous drainage from the old incision itself. She is exquisitely exquisitely tender over this area and is raised approximately centimeters  above the skin surface.   Impression and plan nonhealing right great toe amputation with the probable abscess in the metatarsal bone. Patient will be admitted to the hospital this evening for pain control and antibodies. Will be taken to the operating room tomorrow for debridement and possible resection of bone by Dr. Fields. Discussed with the patient who understands this plan   

## 2015-07-21 NOTE — Progress Notes (Signed)
07/21/2015 1745 Received pt to room 2w18, direct admit from Dr's office.  Pt is A&Ox4.  C/O pain in the R foot, will medicate.  Tele monitor applied, CCMD notified.  Oriented to room, call light and bed.  Call bell in reach.   [name]

## 2015-07-21 NOTE — Progress Notes (Addendum)
ANTIBIOTIC CONSULT NOTE - INITIAL  Pharmacy Consult for Vancomycin and Zosyn Indication: wound infection  Allergies  Allergen Reactions  . Clindamycin/Lincomycin Itching and Rash    Possible reaction (took clindamycin and cipro at the same time)  . Doxycycline Nausea Only  . Percocet [Oxycodone-Acetaminophen] Itching  . Tape Itching and Other (See Comments)    Plastic tape causes itching-USE PAPER TAPE ONLY  . Mycophenolate Diarrhea  . Ciprofloxacin Itching and Rash    Possible reaction (took clindamycin and cipro at the same time)     Patient Measurements: Height: 5\' 1"  (154.9 cm) Weight: 132 lb 11.2 oz (60.192 kg) IBW/kg (Calculated) : 47.8 Adjusted Body Weight:   Vital Signs: Temp: 98.7 F (37.1 C) (09/27 1757) Temp Source: Oral (09/27 1757) BP: 114/67 mmHg (09/27 1757) Pulse Rate: 106 (09/27 1757) Intake/Output from previous day:   Intake/Output from this shift:    Labs: No results for input(s): WBC, HGB, PLT, LABCREA, CREATININE in the last 72 hours. CrCl cannot be calculated (Patient has no serum creatinine result on file.). No results for input(s): VANCOTROUGH, VANCOPEAK, VANCORANDOM, GENTTROUGH, GENTPEAK, GENTRANDOM, TOBRATROUGH, TOBRAPEAK, TOBRARND, AMIKACINPEAK, AMIKACINTROU, AMIKACIN in the last 72 hours.   Microbiology: No results found for this or any previous visit (from the past 720 hour(s)).  Medical History: Past Medical History  Diagnosis Date  . Osteoarthritis   . Osteopenia   . Raynaud phenomenon   . Constipation   . Shortness of breath   . GERD (gastroesophageal reflux disease)   . Headache(784.0)   . Anemia   . Hypertension   . Seizures     with chid birth 45f 2 childre  . Peripheral vascular disease   . Depression   . Anxiety   . Type II diabetes mellitus   . Polymyositis   . Fibromyalgia   . Pneumonia 03/2012  . Polymyositis     Medications:  Prescriptions prior to admission  Medication Sig Dispense Refill Last Dose  .  amLODipine (NORVASC) 10 MG tablet Take 10 mg by mouth daily.   Taking  . Chlorpheniramine-Phenylephrine (SINUS/ALLERGY PE PO) Take 1 tablet by mouth 2 (two) times daily as needed (sinus drainage).   Taking  . cycloSPORINE modified (NEORAL) 25 MG capsule Take 50 mg by mouth 2 (two) times daily.   Not Taking  . diphenhydrAMINE (BENADRYL) 25 MG tablet Take 25 mg by mouth 2 (two) times daily as needed for itching.   Not Taking  . escitalopram (LEXAPRO) 10 MG tablet Take 10 mg by mouth daily.   Taking  . HYDROcodone-acetaminophen (NORCO) 10-325 MG per tablet Take 1 tablet by mouth every 6 (six) hours as needed. (Patient not taking: Reported on 07/21/2015) 30 tablet 0 Not Taking  . HYDROcodone-acetaminophen (NORCO/VICODIN) 5-325 MG per tablet Take 1 tablet by mouth every 6 (six) hours as needed. (Patient not taking: Reported on 07/21/2015) 15 tablet 0 Not Taking  . omeprazole (PRILOSEC) 40 MG capsule Take 40 mg by mouth daily.    Taking  . predniSONE (DELTASONE) 10 MG tablet Take 10 mg by mouth daily with breakfast.   Taking   Scheduled:  . [START ON 07/22/2015] amLODipine  10 mg Oral Daily  . [START ON 07/22/2015] enoxaparin (LOVENOX) injection  30 mg Subcutaneous Q24H  . escitalopram  10 mg Oral Daily  . [START ON 07/22/2015] pantoprazole  40 mg Oral Daily  . pantoprazole  40 mg Oral Daily  . piperacillin-tazobactam (ZOSYN)  IV  3.375 g Intravenous 3 times per day  .  potassium chloride  20-40 mEq Oral Once  . [START ON 07/22/2015] predniSONE  10 mg Oral Q breakfast   Infusions:  . sodium chloride     Assessment: 54yo female with history of HTN, PVD, DM2 and recent toe amputation presents from PCP with severe pain, focal swelling and drainage from toe incisions. Pharmacy is consulted to dose vancomycin and zosyn for wound infection. Tmax 101.3, WBC 9.1, sCr 0.88.  Goal of Therapy:  Vancomycin trough level 10-15 mcg/ml  Plan:  Vancomycin 1g IV once followed by 750mg  q12h Zosyn 3.375g IV  q8h Measure antibiotic drug levels at steady state Follow up culture results, renal function and clinical course  . Arlean Hopping, PharmD Clinical Pharmacist Pager [phone] 07/21/2015,6:34 PM

## 2015-07-22 ENCOUNTER — Inpatient Hospital Stay (HOSPITAL_COMMUNITY)
Admission: RE | Admit: 2015-07-22 | Payer: BLUE CROSS/BLUE SHIELD | Source: Ambulatory Visit | Admitting: Vascular Surgery

## 2015-07-22 ENCOUNTER — Encounter (HOSPITAL_COMMUNITY)
Admission: AD | Disposition: A | Discharge status: Home Health | Payer: Self-pay | Source: Ambulatory Visit | Attending: Vascular Surgery

## 2015-07-22 ENCOUNTER — Encounter (HOSPITAL_COMMUNITY): Payer: Self-pay | Admitting: General Practice

## 2015-07-22 ENCOUNTER — Inpatient Hospital Stay (HOSPITAL_COMMUNITY): Payer: BLUE CROSS/BLUE SHIELD | Admitting: Anesthesiology

## 2015-07-22 DIAGNOSIS — T814XXA Infection following a procedure, initial encounter: Secondary | ICD-10-CM

## 2015-07-22 HISTORY — PX: AMPUTATION: SHX166

## 2015-07-22 LAB — CBC
HCT: 33.1 % — ABNORMAL LOW (ref 36.0–46.0)
HEMOGLOBIN: 10.3 g/dL — AB (ref 12.0–15.0)
MCH: 27.5 pg (ref 26.0–34.0)
MCHC: 31.1 g/dL (ref 30.0–36.0)
MCV: 88.5 fL (ref 78.0–100.0)
Platelets: 257 10*3/uL (ref 150–400)
RBC: 3.74 MIL/uL — ABNORMAL LOW (ref 3.87–5.11)
RDW: 16.5 % — ABNORMAL HIGH (ref 11.5–15.5)
WBC: 8.2 10*3/uL (ref 4.0–10.5)

## 2015-07-22 LAB — SURGICAL PCR SCREEN
MRSA, PCR: NEGATIVE
Staphylococcus aureus: NEGATIVE

## 2015-07-22 LAB — GLUCOSE, CAPILLARY: Glucose-Capillary: 101 mg/dL — ABNORMAL HIGH (ref 65–99)

## 2015-07-22 LAB — CREATININE, SERUM
CREATININE: 0.91 mg/dL (ref 0.44–1.00)
GFR calc Af Amer: >60 mL/min (ref >60)

## 2015-07-22 SURGERY — AMPUTATION, FOOT, RAY
Anesthesia: General | Site: Toe | Laterality: Right

## 2015-07-22 MED ORDER — METOPROLOL TARTRATE 1 MG/ML IV SOLN
2.0000 mg | INTRAVENOUS | Status: DC | PRN
Start: 2015-07-22 — End: 2015-07-25

## 2015-07-22 MED ORDER — FENTANYL CITRATE (PF) 100 MCG/2ML IJ SOLN
INTRAMUSCULAR | Status: DC | PRN
  Administered 2015-07-22: 25 ug via INTRAVENOUS
  Administered 2015-07-22 (×2): 50 ug via INTRAVENOUS
  Administered 2015-07-22: 25 ug via INTRAVENOUS

## 2015-07-22 MED ORDER — PROMETHAZINE HCL 25 MG/ML IJ SOLN: 6.2500 mg | INTRAMUSCULAR | Status: DC | PRN

## 2015-07-22 MED ORDER — AMLODIPINE BESYLATE 10 MG PO TABS
10.0000 mg | ORAL_TABLET | Freq: Every day | ORAL | Status: DC
  Administered 2015-07-22 – 2015-07-24 (×3): 10 mg via ORAL
  Filled 2015-07-22 (×4): qty 1

## 2015-07-22 MED ORDER — MIDAZOLAM HCL 5 MG/5ML IJ SOLN
INTRAMUSCULAR | Status: DC | PRN
  Administered 2015-07-22: 1 mg via INTRAVENOUS

## 2015-07-22 MED ORDER — FENTANYL CITRATE (PF) 100 MCG/2ML IJ SOLN
INTRAMUSCULAR | Status: AC
  Filled 2015-07-22: qty 2

## 2015-07-22 MED ORDER — ONDANSETRON HCL 4 MG/2ML IJ SOLN: 4.0000 mg | Freq: Four times a day (QID) | INTRAMUSCULAR | Status: DC | PRN

## 2015-07-22 MED ORDER — ESCITALOPRAM OXALATE 10 MG PO TABS
10.0000 mg | ORAL_TABLET | Freq: Every day | ORAL | Status: DC
  Administered 2015-07-22 – 2015-07-24 (×3): 10 mg via ORAL
  Filled 2015-07-22 (×4): qty 1

## 2015-07-22 MED ORDER — BISACODYL 10 MG RE SUPP: 10.0000 mg | Freq: Every day | RECTAL | Status: DC | PRN

## 2015-07-22 MED ORDER — POTASSIUM CHLORIDE CRYS ER 20 MEQ PO TBCR: 20.0000 meq | EXTENDED_RELEASE_TABLET | Freq: Once | ORAL | Status: DC

## 2015-07-22 MED ORDER — SODIUM CHLORIDE 0.9 % IV SOLN
INTRAVENOUS | Status: DC
  Administered 2015-07-22 – 2015-07-24 (×4): via INTRAVENOUS

## 2015-07-22 MED ORDER — MORPHINE SULFATE (PF) 4 MG/ML IV SOLN
INTRAVENOUS | Status: AC
  Administered 2015-07-22: 3 mg via INTRAVENOUS
  Filled 2015-07-22: qty 1

## 2015-07-22 MED ORDER — GUAIFENESIN-DM 100-10 MG/5ML PO SYRP: 15.0000 mL | ORAL_SOLUTION | ORAL | Status: DC | PRN

## 2015-07-22 MED ORDER — ACETAMINOPHEN 325 MG PO TABS: 325.0000 mg | ORAL_TABLET | ORAL | Status: DC | PRN

## 2015-07-22 MED ORDER — HYDROCODONE-ACETAMINOPHEN 10-325 MG PO TABS
1.0000 | ORAL_TABLET | ORAL | Status: DC | PRN
  Administered 2015-07-22 – 2015-07-23 (×2): 2 via ORAL
  Filled 2015-07-22 (×2): qty 2

## 2015-07-22 MED ORDER — PROPOFOL 10 MG/ML IV BOLUS
INTRAVENOUS | Status: DC | PRN
  Administered 2015-07-22: 20 mg via INTRAVENOUS
  Administered 2015-07-22: 160 mg via INTRAVENOUS
  Administered 2015-07-22: 40 mg via INTRAVENOUS

## 2015-07-22 MED ORDER — FENTANYL CITRATE (PF) 100 MCG/2ML IJ SOLN
25.0000 ug | INTRAMUSCULAR | Status: DC | PRN
  Administered 2015-07-22: 75 ug via INTRAVENOUS

## 2015-07-22 MED ORDER — HYDRALAZINE HCL 20 MG/ML IJ SOLN: 5.0000 mg | INTRAMUSCULAR | Status: DC | PRN

## 2015-07-22 MED ORDER — ACETAMINOPHEN 325 MG RE SUPP: 325.0000 mg | RECTAL | Status: DC | PRN

## 2015-07-22 MED ORDER — ALUM & MAG HYDROXIDE-SIMETH 200-200-20 MG/5ML PO SUSP: 15.0000 mL | ORAL | Status: DC | PRN

## 2015-07-22 MED ORDER — ARTIFICIAL TEARS OP OINT
TOPICAL_OINTMENT | OPHTHALMIC | Status: DC | PRN
  Administered 2015-07-22: 1 via OPHTHALMIC

## 2015-07-22 MED ORDER — MAGNESIUM HYDROXIDE 400 MG/5ML PO SUSP
30.0000 mL | Freq: Every day | ORAL | Status: DC | PRN
  Administered 2015-07-23: 30 mL via ORAL
  Filled 2015-07-22: qty 30

## 2015-07-22 MED ORDER — PHENOL 1.4 % MT LIQD: 1.0000 | OROMUCOSAL | Status: DC | PRN

## 2015-07-22 MED ORDER — LIDOCAINE HCL (CARDIAC) 20 MG/ML IV SOLN
INTRAVENOUS | Status: DC | PRN
  Administered 2015-07-22: 60 mg via INTRAVENOUS

## 2015-07-22 MED ORDER — LABETALOL HCL 5 MG/ML IV SOLN: 10.0000 mg | INTRAVENOUS | Status: DC | PRN

## 2015-07-22 MED ORDER — LACTATED RINGERS IV SOLN
INTRAVENOUS | Status: DC
  Administered 2015-07-22: 13:00:00 via INTRAVENOUS

## 2015-07-22 MED ORDER — MORPHINE SULFATE (PF) 2 MG/ML IV SOLN
2.0000 mg | INTRAVENOUS | Status: DC | PRN
  Administered 2015-07-22 – 2015-07-25 (×9): 2 mg via INTRAVENOUS
  Filled 2015-07-22 (×9): qty 1

## 2015-07-22 MED ORDER — ENOXAPARIN SODIUM 30 MG/0.3ML ~~LOC~~ SOLN
30.0000 mg | SUBCUTANEOUS | Status: DC
  Administered 2015-07-23: 30 mg via SUBCUTANEOUS
  Filled 2015-07-22: qty 0.3

## 2015-07-22 MED ORDER — PANTOPRAZOLE SODIUM 40 MG PO TBEC
40.0000 mg | DELAYED_RELEASE_TABLET | Freq: Every day | ORAL | Status: DC
  Administered 2015-07-22 – 2015-07-24 (×3): 40 mg via ORAL
  Filled 2015-07-22: qty 2
  Filled 2015-07-22 (×3): qty 1

## 2015-07-22 MED ORDER — 0.9 % SODIUM CHLORIDE (POUR BTL) OPTIME
TOPICAL | Status: DC | PRN
  Administered 2015-07-22: 1000 mL

## 2015-07-22 MED ORDER — PREDNISONE 10 MG PO TABS
10.0000 mg | ORAL_TABLET | Freq: Every day | ORAL | Status: DC
  Administered 2015-07-22 – 2015-07-24 (×3): 10 mg via ORAL
  Filled 2015-07-22 (×4): qty 1

## 2015-07-22 MED ORDER — ENOXAPARIN SODIUM 40 MG/0.4ML ~~LOC~~ SOLN
40.0000 mg | SUBCUTANEOUS | Status: DC
Start: 2015-07-23 — End: 2015-07-22

## 2015-07-22 SURGICAL SUPPLY — 32 items
BANDAGE ELASTIC 4 VELCRO ST LF (GAUZE/BANDAGES/DRESSINGS) ×1 IMPLANT
BNDG GAUZE ELAST 4 BULKY (GAUZE/BANDAGES/DRESSINGS) ×2 IMPLANT
CANISTER SUCTION 2500CC (MISCELLANEOUS) ×2 IMPLANT
COVER SURGICAL LIGHT HANDLE (MISCELLANEOUS) ×2 IMPLANT
DRAPE EXTREMITY T 121X128X90 (DRAPE) ×2 IMPLANT
DRSG EMULSION OIL 3X3 NADH (GAUZE/BANDAGES/DRESSINGS) ×1 IMPLANT
ELECT REM PT RETURN 9FT ADLT (ELECTROSURGICAL) ×2
ELECTRODE REM PT RTRN 9FT ADLT (ELECTROSURGICAL) ×1 IMPLANT
GAUZE SPONGE 2X2 8PLY STRL LF (GAUZE/BANDAGES/DRESSINGS) IMPLANT
GAUZE SPONGE 4X4 12PLY STRL (GAUZE/BANDAGES/DRESSINGS) ×1 IMPLANT
GLOVE BIO SURGEON STRL SZ7.5 (GLOVE) ×2 IMPLANT
GLOVE BIOGEL PI IND STRL 6.5 (GLOVE) IMPLANT
GLOVE BIOGEL PI IND STRL 7.0 (GLOVE) IMPLANT
GLOVE BIOGEL PI INDICATOR 6.5 (GLOVE) ×2
GLOVE BIOGEL PI INDICATOR 7.0 (GLOVE) ×1
GLOVE ECLIPSE 6.5 STRL STRAW (GLOVE) ×1 IMPLANT
GOWN STRL REUS W/ TWL LRG LVL3 (GOWN DISPOSABLE) ×3 IMPLANT
GOWN STRL REUS W/TWL LRG LVL3 (GOWN DISPOSABLE) ×4
KIT BASIN OR (CUSTOM PROCEDURE TRAY) ×2 IMPLANT
KIT ROOM TURNOVER OR (KITS) ×2 IMPLANT
NS IRRIG 1000ML POUR BTL (IV SOLUTION) ×2 IMPLANT
PACK GENERAL/GYN (CUSTOM PROCEDURE TRAY) ×2 IMPLANT
PAD ARMBOARD 7.5X6 YLW CONV (MISCELLANEOUS) ×4 IMPLANT
SPECIMEN JAR SMALL (MISCELLANEOUS) ×2 IMPLANT
SPONGE GAUZE 2X2 STER 10/PKG (GAUZE/BANDAGES/DRESSINGS) ×1
SUT ETHILON 3 0 PS 1 (SUTURE) ×3 IMPLANT
SUT VIC AB 3-0 SH 27 (SUTURE) ×2
SUT VIC AB 3-0 SH 27X BRD (SUTURE) IMPLANT
SWAB COLLECTION DEVICE MRSA (MISCELLANEOUS) ×1 IMPLANT
TUBE ANAEROBIC SPECIMEN COL (MISCELLANEOUS) IMPLANT
UNDERPAD 30X30 INCONTINENT (UNDERPADS AND DIAPERS) ×2 IMPLANT
WATER STERILE IRR 1000ML POUR (IV SOLUTION) ×2 IMPLANT

## 2015-07-22 NOTE — H&P (View-Only) (Signed)
Patient presents today for concern regarding nonhealing of right great toe amputation site fevers and chills. She post right great toe amputation hospital 2 months ago. She has known severe digital occlusive disease. She initially had satisfactory healing of this. Was seen in our office by Dr. Darrick Penna on 06/25/2015 was some diffuse swelling but the appear to be healing satisfactorily.Seen thereafter she developed focal swelling on the medial aspect of her first metatarsal bone. I this has progressed with severe pain. She's been having fevers to 101. Also has had some drainage from the toe incisions.  Past Medical History  Diagnosis Date  . Osteoarthritis   . Osteopenia   . Raynaud phenomenon   . Constipation   . Shortness of breath   . GERD (gastroesophageal reflux disease)   . Headache(784.0)   . Anemia   . Hypertension   . Seizures     with chid birth 86f 2 childre  . Peripheral vascular disease   . Depression   . Anxiety   . Type II diabetes mellitus   . Polymyositis   . Fibromyalgia   . Pneumonia 03/2012  . Polymyositis     Social History  Substance Use Topics  . Smoking status: Never Smoker   . Smokeless tobacco: Never Used  . Alcohol Use: Yes     Comment: 05/19/2015 "might have a couple drinks/yr"    Family History  Problem Relation Age of Onset  . Diabetes Mother   . Hypertension Mother   . Diabetes Father   . Heart disease Father   . Hypertension Sister   . Heart disease Sister     Allergies  Allergen Reactions  . Clindamycin/Lincomycin Itching and Rash    Possible reaction (took clindamycin and cipro at the same time)  . Doxycycline Nausea Only  . Percocet [Oxycodone-Acetaminophen] Itching  . Tape Itching and Other (See Comments)    Plastic tape causes itching-USE PAPER TAPE ONLY  . Mycophenolate Diarrhea  . Ciprofloxacin Itching and Rash    Possible reaction (took clindamycin and cipro at the same time)      Current outpatient prescriptions:  .   acetaminophen (TYLENOL) 500 MG tablet, Take 500 mg by mouth every 6 (six) hours as needed., Disp: , Rfl:  .  amLODipine (NORVASC) 10 MG tablet, Take 10 mg by mouth daily., Disp: , Rfl:  .  Chlorpheniramine-Phenylephrine (SINUS/ALLERGY PE PO), Take 1 tablet by mouth 2 (two) times daily as needed (sinus drainage)., Disp: , Rfl:  .  escitalopram (LEXAPRO) 10 MG tablet, Take 10 mg by mouth daily., Disp: , Rfl:  .  omeprazole (PRILOSEC) 40 MG capsule, Take 40 mg by mouth daily. , Disp: , Rfl:  .  predniSONE (DELTASONE) 10 MG tablet, Take 10 mg by mouth daily with breakfast., Disp: , Rfl:  .  cycloSPORINE modified (NEORAL) 25 MG capsule, Take 50 mg by mouth 2 (two) times daily., Disp: , Rfl:  .  diphenhydrAMINE (BENADRYL) 25 MG tablet, Take 25 mg by mouth 2 (two) times daily as needed for itching., Disp: , Rfl:  .  HYDROcodone-acetaminophen (NORCO) 10-325 MG per tablet, Take 1 tablet by mouth every 6 (six) hours as needed. (Patient not taking: Reported on 07/21/2015), Disp: 30 tablet, Rfl: 0 .  HYDROcodone-acetaminophen (NORCO/VICODIN) 5-325 MG per tablet, Take 1 tablet by mouth every 6 (six) hours as needed. (Patient not taking: Reported on 07/21/2015), Disp: 15 tablet, Rfl: 0 .  naproxen sodium (ANAPROX) 220 MG tablet, Take 440 mg by mouth 2 (two)  times daily as needed (pain). ALEVE, Disp: , Rfl:   Filed Vitals:   07/21/15 1551  BP: 114/71  Pulse: 103  Temp: 101.3 F (38.5 C)  TempSrc: Oral  Resp: 20  Height: 5\' 1"  (1.549 m)  Weight: 133 lb 1.6 oz (60.374 kg)  SpO2: 99%    Body mass index is 25.16 kg/(m^2).      On physical exam he is uncomfortable from pain in her right foot. Is having some chills. Temperature is 101  Does have a area with a superficial skin ulceration over the medial aspect several centimeters proximal to the old incision. There is some serous drainage from the old incision itself. She is exquisitely exquisitely tender over this area and is raised approximately centimeters  above the skin surface.   Impression and plan nonhealing right great toe amputation with the probable abscess in the metatarsal bone. Patient will be admitted to the hospital this evening for pain control and antibodies. Will be taken to the operating room tomorrow for debridement and possible resection of bone by Dr. . Discussed with the patient who understands this plan

## 2015-07-22 NOTE — Progress Notes (Signed)
Pt found in bathroom, unassisted. When asked why she didn't call for help, pt stated, "I had to pee." Pt helped back to bed and reeducated on the importance of using the call bell and the walker. Call bell placed within reach. Will continue to monitor.

## 2015-07-22 NOTE — Progress Notes (Signed)
Pt arrived back to unit from PACU. Complaining of pain. VSS. Will continue to monitor.

## 2015-07-22 NOTE — Transfer of Care (Signed)
Immediate Anesthesia Transfer of Care Note  Patient: Allison Beasley  Procedure(s) Performed: Procedure(s): RAY AMPUTATION OF RIGHT GREAT TOE AMPUTATION SITE (Right)  Patient Location: PACU  Anesthesia Type:General  Level of Consciousness: awake, alert , oriented and patient cooperative  Airway & Oxygen Therapy: Patient Spontanous Breathing and Patient connected to nasal cannula oxygen  Post-op Assessment: Report given to RN, Post -op Vital signs reviewed and stable and Patient moving all extremities  Post vital signs: Reviewed and stable  Last Vitals:  Filed Vitals:   07/22/15 1454  BP:   Pulse:   Temp: 36.8 C  Resp:     Complications: No apparent anesthesia complications

## 2015-07-22 NOTE — Op Note (Signed)
Procedure: Resection of right 1st metatarsal Preop: infection right foot Postop: same Anesthesia: general Specimens: culture right foot Details:   After obtaining informed consent, the patient was taken to the operating room. After induction of general anesthesia and placement of a laryngeal mask, the patient's entire right foot was prepped and draped in usual sterile fashion. Next a longitudinal incision was made on the medial aspect of the foot over the right first metatarsal. Incision was carried down through the subcutaneous tissues into an abscess cavity. This abscess cavity continued all the way down to the level of the metatarsal bone. This segment of metatarsal bone was very friable and was removed with rongeurs. The foot overall had fairly healthy-appearing tissue. The wound was thoroughly irrigated with normal saline solution. A saline moistened gauze dressing was then placed in the bed of the wound. The patient our the procedure well and there were no complications. The instrument sponge and needle count was correct at the end of the case. The patient was taken to the recovery room in stable condition.  Fabienne Bruns, MD [facility] [place] Office: [phone] Pager: [phone]

## 2015-07-22 NOTE — Interval H&P Note (Signed)
History and Physical Interval Note:  07/22/2015 1:34 PM  Allison Beasley  has presented today for surgery, with the diagnosis of PVD with Gangrene  I70.261 Non-healing surgical wound of right great toe amputation T81.89A   The various methods of treatment have been discussed with the patient and family. After consideration of risks, benefits and other options for treatment, the patient has consented to  Procedure(s): RAY AMPUTATION OF RIGHT GREAT TOE AMP SITE (Right) as a surgical intervention .  The patient's history has been reviewed, patient examined, no change in status, stable for surgery.  I have reviewed the patient's chart and labs.  Questions were answered to the patient's satisfaction.     Fabienne Bruns

## 2015-07-22 NOTE — Anesthesia Procedure Notes (Signed)
Procedure Name: LMA Insertion Date/Time: 07/22/2015 2:12 PM Performed by: Darcey Nora B Pre-anesthesia Checklist: Emergency Drugs available, Patient being monitored, Suction available and Patient identified Patient Re-evaluated:Patient Re-evaluated prior to inductionOxygen Delivery Method: Circle system utilized Preoxygenation: Pre-oxygenation with 100% oxygen Intubation Type: IV induction Ventilation: Mask ventilation without difficulty LMA: LMA inserted LMA Size: 4.0 Number of attempts: 1 Placement Confirmation: positive ETCO2,  breath sounds checked- equal and bilateral and ETT inserted through vocal cords under direct vision Tube secured with: Tape (taped across cheeks) Dental Injury: Teeth and Oropharynx as per pre-operative assessment

## 2015-07-22 NOTE — Anesthesia Postprocedure Evaluation (Signed)
  Anesthesia Post-op Note  Patient: Allison Beasley  Procedure(s) Performed: Procedure(s): RAY AMPUTATION OF RIGHT GREAT TOE AMPUTATION SITE (Right)  Patient Location: PACU  Anesthesia Type:General  Level of Consciousness: awake and alert   Airway and Oxygen Therapy: Patient Spontanous Breathing  Post-op Pain: none  Post-op Assessment: Post-op Vital signs reviewed              Post-op Vital Signs: Reviewed  Last Vitals:  Filed Vitals:   07/22/15 1510  BP:   Pulse: 98  Temp:   Resp: 11    Complications: No apparent anesthesia complications

## 2015-07-22 NOTE — Anesthesia Preprocedure Evaluation (Addendum)
Anesthesia Evaluation  Patient identified by MRN, date of birth, ID band Patient awake    Reviewed: Allergy & Precautions, NPO status , Patient's Chart, lab work & pertinent test results  Airway Mallampati: III  TM Distance: >3 FB Neck ROM: Full  Mouth opening: Limited Mouth Opening  Dental   Pulmonary neg pulmonary ROS,    breath sounds clear to auscultation       Cardiovascular hypertension, Pt. on medications + Peripheral Vascular Disease   Rhythm:Regular Rate:Normal     Neuro/Psych  Neuromuscular disease    GI/Hepatic Neg liver ROS, GERD  ,  Endo/Other  diabetes  Renal/GU negative Renal ROS     Musculoskeletal  (+) Arthritis , Osteoarthritis,  Fibromyalgia -  Abdominal   Peds  Hematology  (+) anemia ,   Anesthesia Other Findings   Reproductive/Obstetrics                            Anesthesia Physical Anesthesia Plan  ASA: III  Anesthesia Plan: General   Post-op Pain Management:    Induction: Intravenous  Airway Management Planned: LMA  Additional Equipment:   Intra-op Plan:   Post-operative Plan:   Informed Consent: I have reviewed the patients History and Physical, chart, labs and discussed the procedure including the risks, benefits and alternatives for the proposed anesthesia with the patient or authorized representative who has indicated his/her understanding and acceptance.     Plan Discussed with: CRNA  Anesthesia Plan Comments:         Anesthesia Quick Evaluation

## 2015-07-23 ENCOUNTER — Encounter (HOSPITAL_COMMUNITY): Payer: Self-pay | Admitting: Vascular Surgery

## 2015-07-23 LAB — BASIC METABOLIC PANEL
Anion gap: 8 (ref 5–15)
BUN: 9 mg/dL (ref 6–20)
CO2: 22 mmol/L (ref 22–32)
CREATININE: 0.82 mg/dL (ref 0.44–1.00)
Calcium: 7.7 mg/dL — ABNORMAL LOW (ref 8.9–10.3)
Chloride: 105 mmol/L (ref 101–111)
GFR calc Af Amer: >60 mL/min (ref >60)
Glucose, Bld: 96 mg/dL (ref 65–99)
POTASSIUM: 4.2 mmol/L (ref 3.5–5.1)
SODIUM: 135 mmol/L (ref 135–145)

## 2015-07-23 LAB — CBC
HCT: 34.2 % — ABNORMAL LOW (ref 36.0–46.0)
Hemoglobin: 10.6 g/dL — ABNORMAL LOW (ref 12.0–15.0)
MCH: 27.6 pg (ref 26.0–34.0)
MCHC: 31 g/dL (ref 30.0–36.0)
MCV: 89.1 fL (ref 78.0–100.0)
PLATELETS: 244 10*3/uL (ref 150–400)
RBC: 3.84 MIL/uL — AB (ref 3.87–5.11)
RDW: 16.4 % — ABNORMAL HIGH (ref 11.5–15.5)
WBC: 5.6 10*3/uL (ref 4.0–10.5)

## 2015-07-23 NOTE — Progress Notes (Signed)
[facility]  Subjective  - foot hurts   Objective 107/69 85 98 F (36.7 C) (Oral) 16 93%  Intake/Output Summary (Last 24 hours) at 07/23/15 0829 Last data filed at 07/22/15 1505  Gross per 24 hour  Intake    600 ml  Output     25 ml  Net    575 ml   Right foot wound clean no real granulation yet  Assessment/Planning: Follow up culture results to tailor antibiotics Wt bearing on heel Pain control, potentially home next 2 days Anemia chronic trend  Fabienne Bruns 07/23/2015 8:29 AM --  Laboratory Lab Results:  Recent Labs  07/22/15 1612 07/23/15 0337  WBC 8.2 5.6  HGB 10.3* 10.6*  HCT 33.1* 34.2*  PLT 257 244   BMET  Recent Labs  07/21/15 1934 07/22/15 1612 07/23/15 0337  NA 134*  --  135  K 4.1  --  4.2  CL 103  --  105  CO2 22  --  22  GLUCOSE 84  --  96  BUN 8  --  9  CREATININE 0.88 0.91 0.82  CALCIUM 7.7*  --  7.7*    COAG Lab Results  Component Value Date   INR 1.11 07/21/2015   INR 1.02 01/13/2013   No results found for: PTT

## 2015-07-23 NOTE — Progress Notes (Signed)
UR Completed. Samantha Claxton, RN, BSN.  [phone] 

## 2015-07-24 MED ORDER — ENOXAPARIN SODIUM 30 MG/0.3ML ~~LOC~~ SOLN
30.0000 mg | SUBCUTANEOUS | Status: DC
  Administered 2015-07-24: 30 mg via SUBCUTANEOUS
  Filled 2015-07-24: qty 0.3

## 2015-07-24 MED ORDER — LEVOFLOXACIN 500 MG PO TABS
500.0000 mg | ORAL_TABLET | Freq: Every day | ORAL | Status: DC
  Administered 2015-07-24: 500 mg via ORAL
  Filled 2015-07-24 (×2): qty 1

## 2015-07-24 NOTE — Progress Notes (Signed)
ANTIBIOTIC CONSULT NOTE - FOLLOW UP  Pharmacy Consult for vancomycin and Zosyn Indication: wound infection  Allergies  Allergen Reactions  . Clindamycin/Lincomycin Itching and Rash    Possible reaction (took clindamycin and cipro at the same time)  . Doxycycline Nausea Only  . Percocet [Oxycodone-Acetaminophen] Itching  . Tape Itching and Other (See Comments)    Plastic tape causes itching-USE PAPER TAPE ONLY  . Mycophenolate Diarrhea  . Ciprofloxacin Itching and Rash    Possible reaction (took clindamycin and cipro at the same time)     Patient Measurements: Height: 5\' 1"  (154.9 cm) Weight: 132 lb 11.2 oz (60.192 kg) IBW/kg (Calculated) : 47.8  Vital Signs: Temp: 98.7 F (37.1 C) (09/30 0456) Temp Source: Oral (09/30 0456) BP: 125/74 mmHg (09/30 1006) Pulse Rate: 85 (09/30 0456) Intake/Output from previous day: 09/29 0701 - 09/30 0700 In: 1075 [I.V.:825; IV Piggyback:250] Out: -  Intake/Output from this shift: Total I/O In: 240 [P.O.:240] Out: -   Labs:  Recent Labs  07/21/15 1934 07/22/15 1612 07/23/15 0337  WBC 9.1 8.2 5.6  HGB 10.4* 10.3* 10.6*  PLT 266 257 244  CREATININE 0.88 0.91 0.82   Estimated Creatinine Clearance: 64.6 mL/min (by C-G formula based on Cr of 0.82). No results for input(s): VANCOTROUGH, VANCOPEAK, VANCORANDOM, GENTTROUGH, GENTPEAK, GENTRANDOM, TOBRATROUGH, TOBRAPEAK, TOBRARND, AMIKACINPEAK, AMIKACINTROU, AMIKACIN in the last 72 hours.   Microbiology: Recent Results (from the past 720 hour(s))  Surgical pcr screen     Status: None   Collection Time: 07/21/15 10:23 PM  Result Value Ref Range Status   MRSA, PCR NEGATIVE NEGATIVE Final   Staphylococcus aureus NEGATIVE NEGATIVE Final    Comment:        The Xpert SA Assay (FDA approved for NASAL specimens in patients over 41 years of age), is one component of a comprehensive surveillance program.  Test performance has been validated by [facility] for patients greater than or  equal to 63 year old. It is not intended to diagnose infection nor to guide or monitor treatment.   Culture, routine-abscess     Status: None (Preliminary result)   Collection Time: 07/22/15  2:22 PM  Result Value Ref Range Status   Specimen Description ABSCESS RIGHT FOOT  Final   Special Requests PATIENT ON FOLLOWING ZOSYN  Final   Gram Stain   Final    FEW WBC PRESENT,BOTH PMN AND MONONUCLEAR NO SQUAMOUS EPITHELIAL CELLS SEEN NO ORGANISMS SEEN Performed at 07/24/15    Culture PENDING  Incomplete   Report Status PENDING  Incomplete    Anti-infectives    Start     Dose/Rate Route Frequency Ordered Stop   07/22/15 0900  vancomycin (VANCOCIN) IVPB 750 mg/150 ml premix     750 mg 150 mL/hr over 60 Minutes Intravenous Every 12 hours 07/21/15 2059     07/22/15 0500  piperacillin-tazobactam (ZOSYN) IVPB 3.375 g     3.375 g 12.5 mL/hr over 240 Minutes Intravenous Every 8 hours 07/21/15 2059     07/21/15 2200  piperacillin-tazobactam (ZOSYN) IVPB 3.375 g  Status:  Discontinued     3.375 g 12.5 mL/hr over 240 Minutes Intravenous 3 times per day 07/21/15 1833 07/21/15 1837   07/21/15 1900  piperacillin-tazobactam (ZOSYN) IVPB 3.375 g  Status:  Discontinued     3.375 g 12.5 mL/hr over 240 Minutes Intravenous Every 8 hours 07/21/15 1837 07/21/15 1845   07/21/15 1900  vancomycin (VANCOCIN) IVPB 1000 mg/200 mL premix  Status:  Discontinued  1,000 mg 200 mL/hr over 60 Minutes Intravenous Every 12 hours 07/21/15 1841 07/21/15 1845   07/21/15 1900  vancomycin (VANCOCIN) IVPB 1000 mg/200 mL premix     1,000 mg 200 mL/hr over 60 Minutes Intravenous  Once 07/21/15 1845 07/21/15 2317   07/21/15 1900  piperacillin-tazobactam (ZOSYN) IVPB 3.375 g     3.375 g 100 mL/hr over 30 Minutes Intravenous  Once 07/21/15 1845 07/21/15 2141      Assessment: 54 year old female s/p right great toe amputation who needed resection this admission due to an abscess.  She is on Day #4 of  antibiotic therapy, currently Vancomycin and Zosyn.  Her renal function has been stable thus far (no SCr today).  No positive culture data yet.  Goal of Therapy:  Vancomycin trough level 15-20 mcg/ml  Plan:  Continue Zosyn 3.375gm IV q8h extended infusion Continue Vancomycin 750mg  IV q12h Check Vancomycin trough and BMET 10/1 AM Monitor renal function and any available micro data  , Pharm.D., BCPS, AAHIVP Clinical Pharmacist Phone: [phone] or [phone] 07/24/2015, 10:26 AM

## 2015-07-24 NOTE — Progress Notes (Signed)
[facility] [place]  Subjective  - still with pain in foot   Objective 125/74 85 98.7 F (37.1 C) (Oral) 16 93%  Intake/Output Summary (Last 24 hours) at 07/24/15 1226 Last data filed at 07/24/15 0755  Gross per 24 hour  Intake   1315 ml  Output      0 ml  Net   1315 ml   Right foot dressing dry  Assessment/Planning: Continue local wound care Will need home health wound care D/c home when home health set up and pain controlled with oral medication Switch to po levaquin cultures no growth to date  Fabienne Bruns 07/24/2015 12:26 PM --  Laboratory Lab Results:  Recent Labs  07/22/15 1612 07/23/15 0337  WBC 8.2 5.6  HGB 10.3* 10.6*  HCT 33.1* 34.2*  PLT 257 244   BMET  Recent Labs  07/21/15 1934 07/22/15 1612 07/23/15 0337  NA 134*  --  135  K 4.1  --  4.2  CL 103  --  105  CO2 22  --  22  GLUCOSE 84  --  96  BUN 8  --  9  CREATININE 0.88 0.91 0.82  CALCIUM 7.7*  --  7.7*    COAG Lab Results  Component Value Date   INR 1.11 07/21/2015   INR 1.02 01/13/2013   No results found for: PTT

## 2015-07-24 NOTE — Care Management Note (Addendum)
Case Management Note  Patient Details  Name: Allison Beasley MRN: [id number] Date of Birth: 05/26/1961  Subjective/Objective:     Pt admitted for RAY AMPUTATION OF RIGHT GREAT TOE AMP SITE (Right) as a surgical intervention .  Pt previously has toe amputation, returning with infection at amputation site.              Action/Plan:  Pt is independent from home with husband. Pt already has walker and wheelchair at home; however pt ambulates independently.  Pts PCP:  Dr Lowella Bandy in [place].  CM will monitor for disposition needs   Expected Discharge Date:                  Expected Discharge Plan:  Home/Self Care  In-House Referral:     Discharge planning Services  CM Consult  Post Acute Care Choice:    Choice offered to:  Patient  DME Arranged:    DME Agency:    HH Arranged:  RN (Dressing Changes) HH Agency:  [facility]  Status of Service:  In process, will continue to follow  Medicare Important Message Given:    Date Medicare IM Given:    Medicare IM give by:    Date Additional Medicare IM Given:    Additional Medicare Important Message give by:     If discussed at Long Length of Stay Meetings, dates discussed:    Additional Comments: CM assessed patient after procedure.  Per pt; post prior toe amputation she was independent at home after discharge. CM offered pt choice for [facility] to provide dressing changes as ordered, pt chose [facility], agency contacted and referral accepted.  Address and phone number verified against Epic. Cherylann Parr, RN 07/24/2015, 3:23 PM

## 2015-07-25 MED ORDER — LEVOFLOXACIN 500 MG PO TABS
500.0000 mg | ORAL_TABLET | Freq: Every day | ORAL | Status: DC
Start: 2015-07-25 — End: 2015-08-18

## 2015-07-25 MED ORDER — OXYCODONE-ACETAMINOPHEN 5-325 MG PO TABS: 1.0000 | ORAL_TABLET | Freq: Four times a day (QID) | ORAL | Status: DC | PRN

## 2015-07-25 MED ORDER — ALPRAZOLAM 0.25 MG PO TABS: 0.2500 mg | ORAL_TABLET | Freq: Three times a day (TID) | ORAL | Status: DC | PRN

## 2015-07-25 NOTE — Progress Notes (Signed)
Discharged to home with family office visits in place teaching done  

## 2015-07-25 NOTE — Progress Notes (Addendum)
[facility] Progress Note  Subjective  - Still having pain with right foot. Is very anxious prior to dressing changes.   Tmax 101 BP sys 120s-130s 02 97% RA  Objective Filed Vitals:   07/25/15 0658  BP:   Pulse:   Temp: 99.7 F (37.6 C)  Resp:     Intake/Output Summary (Last 24 hours) at 07/25/15 0825 Last data filed at 07/24/15 1645  Gross per 24 hour  Intake    360 ml  Output      0 ml  Net    360 ml    Right foot wound clean with good red granulation tissue.   Assessment/Planning: 54 y.o. female is s/p: resection of right 1st metatarsal  3 Days Post-Op   Patient ready to go home today. Cultures negative Will discharge home today on po levaquin. HH RN arranged for bid wet to dry dressing changes. Will discharge home today on xanax tid prn for anxiety. Advised patient to take prior to dressing changes.  Follow up in 2 weeks.   Raymond Gurney 07/25/2015 8:25 AM --  Laboratory CBC    Component Value Date/Time   WBC 5.6 07/23/2015 0337   HGB 10.6* 07/23/2015 0337   HCT 34.2* 07/23/2015 0337   PLT 244 07/23/2015 0337    BMET    Component Value Date/Time   NA 135 07/23/2015 0337   K 4.2 07/23/2015 0337   CL 105 07/23/2015 0337   CO2 22 07/23/2015 0337   GLUCOSE 96 07/23/2015 0337   BUN 9 07/23/2015 0337   CREATININE 0.82 07/23/2015 0337   CALCIUM 7.7* 07/23/2015 0337   GFRNONAA >60 07/23/2015 0337   GFRAA >60 07/23/2015 0337    COAG Lab Results  Component Value Date   INR 1.11 07/21/2015   INR 1.02 01/13/2013   No results found for: PTT  Antibiotics Anti-infectives    Start     Dose/Rate Route Frequency Ordered Stop   07/24/15 1400  levofloxacin (LEVAQUIN) tablet 500 mg     500 mg Oral Daily 07/24/15 1229 08/03/15 0959   07/22/15 0900  vancomycin (VANCOCIN) IVPB 750 mg/150 ml premix  Status:  Discontinued     750 mg 150 mL/hr over 60 Minutes Intravenous Every 12 hours 07/21/15 2059 07/24/15 1229   07/22/15 0500   piperacillin-tazobactam (ZOSYN) IVPB 3.375 g  Status:  Discontinued     3.375 g 12.5 mL/hr over 240 Minutes Intravenous Every 8 hours 07/21/15 2059 07/24/15 1229   07/21/15 2200  piperacillin-tazobactam (ZOSYN) IVPB 3.375 g  Status:  Discontinued     3.375 g 12.5 mL/hr over 240 Minutes Intravenous 3 times per day 07/21/15 1833 07/21/15 1837   07/21/15 1900  piperacillin-tazobactam (ZOSYN) IVPB 3.375 g  Status:  Discontinued     3.375 g 12.5 mL/hr over 240 Minutes Intravenous Every 8 hours 07/21/15 1837 07/21/15 1845   07/21/15 1900  vancomycin (VANCOCIN) IVPB 1000 mg/200 mL premix  Status:  Discontinued     1,000 mg 200 mL/hr over 60 Minutes Intravenous Every 12 hours 07/21/15 1841 07/21/15 1845   07/21/15 1900  vancomycin (VANCOCIN) IVPB 1000 mg/200 mL premix     1,000 mg 200 mL/hr over 60 Minutes Intravenous  Once 07/21/15 1845 07/21/15 2317   07/21/15 1900  piperacillin-tazobactam (ZOSYN) IVPB 3.375 g     3.375 g 100 mL/hr over 30 Minutes Intravenous  Once 07/21/15 1845 07/21/15 2141       Allison Berger, PA-C [facility]  Office: [phone] Pager: [phone] 07/25/2015 8:25 AM  Still with some pain but overall improved D/c home on 10 days levaquin Follow up with me next weeks  Allison Beasley

## 2015-07-25 NOTE — Care Management Note (Signed)
Case Management Note  Patient Details  Name: Allison Beasley MRN: [id number] Date of Birth: 1961/06/19  Subjective/Objective:                   RAY AMPUTATION OF RIGHT GREAT TOE AMPUTATION SITE (Right) Action/Plan:  Discharge planning Expected Discharge Date:  07/25/15              Expected Discharge Plan:  Home w Home Health Services  In-House Referral:     Discharge planning Services  CM Consult  Post Acute Care Choice:    Choice offered to:  Patient  DME Arranged:    DME Agency:  [facility]  HH Arranged:  RN (Dressing Changes) HH Agency:  [facility]  Status of Service:  Completed, signed off  Medicare Important Message Given:    Date Medicare IM Given:    Medicare IM give by:    Date Additional Medicare IM Given:    Additional Medicare Important Message give by:     If discussed at Long Length of Stay Meetings, dates discussed:    Additional Comments: CM called [facility] rep, Tiffany to please resume HH care.  HHRN order was placed along with a F2F.  No other CM needs were communicated. Yves Dill, RN 07/25/2015, 1:09 PM

## 2015-07-26 LAB — CULTURE, ROUTINE-ABSCESS: CULTURE: NO GROWTH

## 2015-07-28 ENCOUNTER — Telehealth: Payer: Self-pay | Admitting: Vascular Surgery

## 2015-07-28 NOTE — Telephone Encounter (Signed)
Spoke with pt, dpm °

## 2015-07-28 NOTE — Telephone Encounter (Signed)
-----   Message from Sharee Pimple, RN sent at 07/25/2015  1:46 PM EDT ----- Regarding: Schedule   ----- Message -----    From: Raymond Gurney, PA-C    Sent: 07/25/2015   8:34 AM      To: [facility] Charge Pool  S/p resection of right 1st metatarsal 07/22/15  F/u with Dr. Darrick Penna in 2 weeks  Thanks Selena Batten

## 2015-07-31 NOTE — Discharge Summary (Signed)
[facility] Discharge Summary  Allison Beasley [birth year] 54 y.o. 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 female who presented to the [facility] for concern regarding nonhealing of right great toe amputation site, fevers and chills. She is status post right great toe amputation hospital 2 months ago. She has known severe digital occlusive disease. She initially had satisfactory healing of this. Was seen in our office by Dr. Darrick Penna on 06/25/2015 was some diffuse swelling but the appear to be healing satisfactorily. Seen thereafter she developed focal swelling on the medial aspect of her first metatarsal bone. This progressed to severe pain. She's been having fevers to 101. Also has had some drainage from the toe incisions.  Hospital Course:  The patient was admitted to the hospital on 07/21/15 for pain control and empiric IV antibiotics.   The patient was taken to the operating room on 07/22/2015 and underwent: resection of right 1st metatarsal    The patient tolerated the procedure well and was transported to the PACU in good condition.   POD 1: Her right foot wound was clean without any real granulation tissue.  POD 2: Her wound cultures had no growth to date. She was switched to po levaquin.   POD 3: Home health wound care was set up. Her right foot wound was clean with good red granulation tissue. Her cultures continued to be negative. Her pain was adequately controlled. She was discharged home on POD 3 in good condition.    CBC    Component Value Date/Time   WBC 5.6 07/23/2015 0337   RBC 3.84* 07/23/2015 0337   HGB 10.6* 07/23/2015 0337   HCT 34.2* 07/23/2015 0337   PLT 244 07/23/2015 0337   MCV 89.1 07/23/2015 0337   MCH 27.6 07/23/2015 0337   MCHC 31.0 07/23/2015 0337   RDW 16.4* 07/23/2015 0337   LYMPHSABS 1.5 05/02/2015 1808   MONOABS 0.3 05/02/2015 1808   EOSABS 0.2 05/02/2015 1808   BASOSABS 0.0 05/02/2015 1808    BMET    Component Value Date/Time   NA 135 07/23/2015 0337   K 4.2 07/23/2015 0337   CL 105 07/23/2015 0337   CO2 22 07/23/2015 0337   GLUCOSE 96 07/23/2015 0337   BUN 9 07/23/2015 0337   CREATININE 0.82 07/23/2015 0337   CALCIUM 7.7* 07/23/2015 0337   GFRNONAA >60 07/23/2015 0337   GFRAA >60 07/23/2015 2633     Discharge Instructions:   The patient is discharged to home with extensive instructions on wound care and progressive ambulation.  They are instructed not to drive or perform any heavy lifting until returning to see the physician in his office.  Discharge Instructions    Call MD for:  redness, tenderness, or signs of infection (pain, swelling, bleeding, redness, odor or green/yellow discharge around incision site)    Complete by:  As directed      Call MD for:  severe or increased pain, loss or decreased feeling  in affected limb(s)    Complete by:  As directed      Call MD for:  temperature >100.5    Complete by:  As directed      Discharge wound care:    Complete by:  As directed   Wet to dry  NS dressings to right foot open wound twice daily. Wrap with kerlix and ACE.     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 Infection 07/21/2015    . Toe infection 05/19/2015  . Pain of great toe 05/07/2015  . Peripheral vascular disease, unspecified (HCC) 04/24/2014  . Pain of right lower extremity 04/10/2014  . Sensation of cold in leg- Right Leg and foot 04/10/2014  . Open wound of great toe 03/21/2013  . Aftercare following surgery of the circulatory system, NEC 02/14/2013  . 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 Atherosclerosis of native arteries of the extremities with gangrene (HCC) 01/24/2013  . Dyspnea 01/13/2013  . Elevated troponin borderline -- no true elevation, resolved. 01/13/2013  . Raynaud's disease 01/13/2013  . Polymyositis (HCC) 01/13/2013  . Fibromyalgia 01/13/2013   Past Medical History  Diagnosis Date  . Osteoarthritis   . Osteopenia   . Raynaud phenomenon   . Constipation   . Shortness of breath   . GERD (gastroesophageal reflux disease)   . Headache(784.0)   . Anemia   . Hypertension   . Seizures     with chid birth 78f 2 childre  . Peripheral vascular disease   . Depression   . Anxiety   . Type II diabetes mellitus   . Polymyositis   . Fibromyalgia   . Pneumonia 03/2012  . Polymyositis        Medication List    TAKE these medications        ALPRAZolam 0.25 MG tablet  Commonly known as:  XANAX  Take 1 tablet (0.25 mg total) by mouth 3 (three) times daily as needed for anxiety.     amLODipine 10 MG tablet  Commonly known as:  NORVASC  Take 10 mg by mouth daily.     diphenhydrAMINE 25 MG tablet  Commonly known as:  BENADRYL  Take 25 mg by mouth 2 (two) times daily as needed for itching.     escitalopram 10 MG tablet  Commonly known as:  LEXAPRO  Take 10 mg by mouth daily.     levofloxacin 500 MG tablet  Commonly known as:  LEVAQUIN  Take 1 tablet (500 mg total) by mouth daily.     omeprazole 40 MG capsule  Commonly known as:  PRILOSEC  Take 40 mg by mouth daily.     oxyCODONE-acetaminophen 5-325 MG tablet  Commonly  known as:  PERCOCET/ROXICET  Take 1 tablet by mouth every 6 (six) hours as needed. pain     predniSONE 10 MG tablet  Commonly known as:  DELTASONE  Take 10 mg by mouth daily with breakfast.     SINUS/ALLERGY PE PO  Take 1 tablet by mouth 2 (two) times daily as needed (sinus drainage).  Percocet #30 No Refill Xanax 0.25 mg # 20 No Refill   Disposition: Home  Patient's condition: is Good  Follow up: 1. Dr. Darrick Penna in 2 weeks   Maris Berger, PA-C [facility] [phone] 07/31/2015  8:53 AM

## 2015-08-04 ENCOUNTER — Encounter: Payer: Self-pay | Admitting: Vascular Surgery

## 2015-08-06 ENCOUNTER — Encounter (HOSPITAL_COMMUNITY): Payer: Self-pay | Admitting: Vascular Surgery

## 2015-08-06 ENCOUNTER — Ambulatory Visit (INDEPENDENT_AMBULATORY_CARE_PROVIDER_SITE_OTHER): Payer: Self-pay | Admitting: Vascular Surgery

## 2015-08-06 VITALS — BP 123/77 | HR 96 | Temp 98.5°F | Resp 16 | Ht 61.0 in | Wt 132.0 lb

## 2015-08-06 DIAGNOSIS — L089 Local infection of the skin and subcutaneous tissue, unspecified: Secondary | ICD-10-CM

## 2015-08-06 MED ORDER — OXYCODONE-ACETAMINOPHEN 5-325 MG PO TABS
1.0000 | ORAL_TABLET | Freq: Four times a day (QID) | ORAL | Status: DC | PRN
Start: 2015-08-06 — End: 2015-08-27

## 2015-08-06 NOTE — Progress Notes (Signed)
Patient is a 54 year old female returns for follow-up today. She underwent resection of her right first metatarsal head and drainage of an abscess in her foot on September 28. She is currently doing local wound care twice daily. She is still having some pain in the foot. She denies fever or chills. She finishes her last dose of antibiotics today. She is currently taking Percocet for the pain.  Physical exam:  Filed Vitals:   08/06/15 1422  BP: 123/77  Pulse: 96  Temp: 98.5 F (36.9 C)  TempSrc: Oral  Resp: 16  Height: 5\' 1"  (1.549 m)  Weight: 132 lb (59.875 kg)  SpO2: 98%    Right lower extremity: 3 x 2 cm x 2 cm depth wound right medial foot with healthy-appearing granulation tissue  Assessment: Healing wound right foot would benefit from [facility] therapy at this point. The wound is clean with 90% granulation tissue  Plan: Patient will follow-up with me in 2 weeks. We will try to get home VAC therapy approved. She was given a renewal for Percocet No. 30 dispensed today.  [facility], MD [facility] [place] Office: [phone] Pager: [phone]

## 2015-08-06 NOTE — Addendum Note (Signed)
Addendum  created 08/06/15 1227 by Marcene Duos, MD   Modules edited: Anesthesia Events

## 2015-08-18 ENCOUNTER — Encounter: Payer: Self-pay | Admitting: Family

## 2015-08-18 ENCOUNTER — Telehealth: Payer: Self-pay

## 2015-08-18 ENCOUNTER — Ambulatory Visit (INDEPENDENT_AMBULATORY_CARE_PROVIDER_SITE_OTHER): Payer: BLUE CROSS/BLUE SHIELD | Admitting: Family

## 2015-08-18 ENCOUNTER — Other Ambulatory Visit: Payer: Self-pay | Admitting: Family

## 2015-08-18 ENCOUNTER — Encounter: Payer: Self-pay | Admitting: Vascular Surgery

## 2015-08-18 ENCOUNTER — Other Ambulatory Visit: Payer: Self-pay

## 2015-08-18 VITALS — BP 116/78 | HR 104 | Temp 98.8°F | Resp 16 | Ht 61.0 in | Wt 130.0 lb

## 2015-08-18 DIAGNOSIS — R0689 Other abnormalities of breathing: Secondary | ICD-10-CM

## 2015-08-18 DIAGNOSIS — M069 Rheumatoid arthritis, unspecified: Secondary | ICD-10-CM

## 2015-08-18 DIAGNOSIS — S91101S Unspecified open wound of right great toe without damage to nail, sequela: Secondary | ICD-10-CM

## 2015-08-18 DIAGNOSIS — M3320 Polymyositis, organ involvement unspecified: Secondary | ICD-10-CM

## 2015-08-18 DIAGNOSIS — R350 Frequency of micturition: Secondary | ICD-10-CM

## 2015-08-18 DIAGNOSIS — R0600 Dyspnea, unspecified: Secondary | ICD-10-CM

## 2015-08-18 DIAGNOSIS — R0989 Other specified symptoms and signs involving the circulatory and respiratory systems: Secondary | ICD-10-CM

## 2015-08-18 MED ORDER — LEVOFLOXACIN 500 MG PO TABS
500.0000 mg | ORAL_TABLET | Freq: Every day | ORAL | Status: DC
Start: 2015-08-18 — End: 2015-11-01

## 2015-08-18 NOTE — Telephone Encounter (Signed)
Rec'd phone call from pt.  Reported she is having fevers at night, and doesn't feel well.  Stated her legs have been hurting.  Reported fever last night of 101 degrees.  Stated she was receiving [facility], but that a nurse hasn't been to her house in 2 weeks.  Reported she saw her Rheumatologist last week, Dr. Carolan Clines, and had an xray of her right foot, but hasn't rec'd results yet.  Pt. tearful, and stated "I just don't feel good; what am I going to do?"  Questioned about her right foot wound?  Pt. stated "I think it's getting better."  Stated "it looks pink."  Reported a thin, bloody drainage from the right foot wound.  Questioned about the wound vac.  Pt. stated a wound vac was never placed.  Questioned how she has been taking care of the wound on the right foot?  Stated she is using wet-dry saline gauze.  Pt. reported temp. At approx. 10:50 AM of 100 degrees; reported she took Percocet at 10:00 AM today.  Phone call to [facility].  Spoke with nurse, Aundra Millet.  Per Aundra Millet, the pt. received two home care nurse visits last week, on 10/17, and 10/19.  Reported she is to receive a visit weekly x 2 weeks, then will decreased to every other week, x 6 weeks.  Stated a nurse is scheduled to go to the pt's home tomorrow.  Reported that [facility] hasn't rec'd an order for a wound vac.

## 2015-08-18 NOTE — Telephone Encounter (Signed)
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  Due to pt's fever and overall c/o of feeling poor, scheduled appt. today at 1:15 with NP, for further evaluation.  Notified pt., and agreed to appt. today at 1:15 PM.

## 2015-08-18 NOTE — Progress Notes (Signed)
Postoperative Visit   History of Present Illness  Allison Beasley is a 54 y.o. female patient of Dr. Darrick Penna who is s/p resection of her right first metatarsal head and drainage of an abscess in her foot on July 22, 2015. She is currently doing local wound care twice daily. She is still having some pain in the right foot, is no worse.  She took 10 days of Levaquin, 500 mg daily, and finished on 10/12. She is currently taking Percocet for the pain. Pt has DM, states she has not checked her blood sugar in a month.  She does not smoke and is not exposed to secondhand smoke.  On 07/20/15 BMP glucose was 95, review of records.  Pt states her Raynaud's Disease is the reason she has had issues with her toes.  States she has no feeling of fever or chills, is taking Percocet, states her temperature at 10 am today was 100, before the Percocet.  She has been feeling drained and dyspneic the last 2 weeks, denies a cough.  She has urinary frequency for about 2 weeks, denies dysuria.  Pt states the right foot wound drains pink, thin, foul smelling drainage. She changes the bandage 3x/day as it is almost soaked.  She states the Percocet upsets her stomach, states hydrocodone did not.  For VQI Use Only  PRE-ADM LIVING: Home  AMB STATUS: Ambulatory with walker  Physical Examination  Filed Vitals:   08/18/15 1325  BP: 116/78  Pulse: 104  Temp: 98.8 F (37.1 C)  Resp: 16  Height: 5\' 1"  (1.549 m)  Weight: 130 lb (58.968 kg)  SpO2: 98%   Body mass index is 24.58 kg/(m^2).  She has crackles in all posterior lung fields. She has felt "drained" and been dyspneic for the last 2 weeks, but denies a cough.  She has also had urinary frequency for the last 2 weeks, but denies dysuria. She has strongly audible pulses in her right foot: DP, PT, and peroneal. Left foot pulse are also audible by Doppler but not as strong. Right foot open wound is same size as when pt was seen by Dr. on  08/06/15: 3 x 2 cm x 2 cm depth with beefy red healthy looking tissue, no surrounding tissue erythema, small amount thin watery drainage, no odor, minimal to moderate amount of swelling in surrounding tissue.   Medical Decision Making  Allison Beasley is a 54 y.o. female who presents s/p resection of her right first metatarsal head and drainage of an abscess in her foot on July 22, 2015.  The patient's right first metatarsal head amputation site shows no signs of infection: no surrounding tissue erythema, drainage is watery with no odor, wound bed is beefy red and healthy looking.   Right foot wound abscess culture from 07/22/15 was no growth.   Dr. 07/24/15 spoke with and examined pt. He reviewed the x-ray report of her right foot dated 08/17/15. The changes noted on x-ray that appear to by osteomyelitis could be due to post operative changes.  Right foot open wound culture obtained.  Resume Levaquin 500 mg daily x 10 days.  Continue right foot dressing changes at home.  I advised pt to follow up with her PCP ASAP to evaluate her lungs for possible pneumonia and check her urine for possible UTI. She has signs and symptoms of both pneumonia and cystitis, both possible sources of infection and fever.  Follow up as scheduled with Dr. 08/19/15 on Thursday,  08/20/2015.   Wing Gfeller, Carma Lair, RN, MSN, FNP-C [facility] [place] Office: [phone]  08/18/2015, 1:16 PM  Clinic MD: Early

## 2015-08-19 DIAGNOSIS — L97509 Non-pressure chronic ulcer of other part of unspecified foot with unspecified severity: Secondary | ICD-10-CM | POA: Insufficient documentation

## 2015-08-20 ENCOUNTER — Ambulatory Visit: Payer: Self-pay | Admitting: Vascular Surgery

## 2015-08-21 LAB — WOUND CULTURE
Gram Stain: NONE SEEN
Gram Stain: NONE SEEN

## 2015-08-25 ENCOUNTER — Encounter: Payer: Self-pay | Admitting: Vascular Surgery

## 2015-08-27 ENCOUNTER — Ambulatory Visit (INDEPENDENT_AMBULATORY_CARE_PROVIDER_SITE_OTHER): Payer: BLUE CROSS/BLUE SHIELD | Admitting: Vascular Surgery

## 2015-08-27 ENCOUNTER — Encounter: Payer: Self-pay | Admitting: Vascular Surgery

## 2015-08-27 VITALS — BP 112/72 | HR 87 | Temp 98.0°F | Resp 16 | Ht 61.0 in | Wt 127.0 lb

## 2015-08-27 DIAGNOSIS — L089 Local infection of the skin and subcutaneous tissue, unspecified: Secondary | ICD-10-CM

## 2015-08-27 MED ORDER — OXYCODONE-ACETAMINOPHEN 5-325 MG PO TABS: 1.0000 | ORAL_TABLET | Freq: Three times a day (TID) | ORAL | Status: DC | PRN

## 2015-08-27 NOTE — Progress Notes (Signed)
Patient is a 54 year old female returns for follow-up today. She underwent resection of her right first metatarsal head and drainage of an abscess in her foot on September 28. She is currently doing local wound care twice daily normal saline wet to dry. She is still having some pain in the foot. She denies fever or chills.  She is currently taking Percocet for the pain.  Physical exam:    Filed Vitals:   08/27/15 1615  BP: 112/72  Pulse: 87  Temp: 98 F (36.7 C)  TempSrc: Oral  Resp: 16  Height: 5\' 1"  (1.549 m)  Weight: 127 lb (57.607 kg)  SpO2: 100%    Right lower extremity: 3 x 1 cm x 1 cm depth wound right medial foot with healthy-appearing granulation tissue  Assessment: Healing wound right foot  The wound is clean with 100% granulation tissue  Plan: Patient will follow-up with me in one month.  She was given a renewal for Percocet No. 30 dispensed today , no further refill.  , MD [facility] [place] Office: [phone] Pager: [phone]

## 2015-09-25 ENCOUNTER — Encounter: Payer: Self-pay | Admitting: Vascular Surgery

## 2015-10-01 ENCOUNTER — Ambulatory Visit (INDEPENDENT_AMBULATORY_CARE_PROVIDER_SITE_OTHER): Payer: Self-pay | Admitting: Vascular Surgery

## 2015-10-01 ENCOUNTER — Encounter: Payer: Self-pay | Admitting: Vascular Surgery

## 2015-10-01 VITALS — BP 132/90 | HR 95 | Temp 98.2°F | Resp 14 | Ht 60.0 in | Wt 130.0 lb

## 2015-10-01 DIAGNOSIS — I739 Peripheral vascular disease, unspecified: Secondary | ICD-10-CM

## 2015-10-01 MED ORDER — CEPHALEXIN 500 MG PO CAPS: 500.0000 mg | ORAL_CAPSULE | Freq: Three times a day (TID) | ORAL | Status: DC

## 2015-10-01 NOTE — Progress Notes (Signed)
Patient returns for follow-up today after her recent toe amputation. The toe amputation site is now completely healed. She did have a pustule on the dorsum of her right foot adjacent to the metatarsal head of the third toe. This did not appear to communicate into the deep tissues.   I prescribed for her Keflex 500 mg 3 times a day for 2 week course. I advised her to follow up with her primary care physician regarding this soft tissue infection.   She will follow-up with me on an as-needed basis.

## 2015-10-07 ENCOUNTER — Ambulatory Visit (INDEPENDENT_AMBULATORY_CARE_PROVIDER_SITE_OTHER): Payer: BLUE CROSS/BLUE SHIELD

## 2015-10-07 ENCOUNTER — Other Ambulatory Visit: Payer: Self-pay | Admitting: Anesthesiology

## 2015-10-07 DIAGNOSIS — M545 Low back pain: Secondary | ICD-10-CM

## 2015-10-22 ENCOUNTER — Telehealth: Payer: Self-pay | Admitting: *Deleted

## 2015-10-22 NOTE — Telephone Encounter (Signed)
Patient called in to our triage line to report that her"right foot was extremely infected and swollen" She reports that she was recently admitted to a hospital in [facility] last week for this and she came back here on Tuesday. She was out there visiting her daughter for the holidays. She reports being in great pain "like when Dr. Darrick Penna had to amputate my big toe"; She had right great toe amputation on 07-22-15 by Dr. Darrick Penna. She doesn't know if she is running a fever but she is not seeing any drainage. She does think" that it is beginning to bust open". I have advised this patient to go to the [facility] ED for evaluation of her foot.She voiced understanding and agreement of this plan.    According to Care Everywhere, this morning she called her doctor at [facility], Dr. Melina Copa requested an appt because she was going to get her right foot amputated soon. I also checked the [place] Database and she is seeing Dr. Windy Kalata in [place] for pain meds (10-08-15 Percocet 10-325mg  #90)

## 2015-10-24 ENCOUNTER — Inpatient Hospital Stay (HOSPITAL_COMMUNITY)
Admission: EM | Admit: 2015-10-24 | Discharge: 2015-11-01 | DRG: 574 | Disposition: A | Discharge status: Home / Self Care | Payer: BLUE CROSS/BLUE SHIELD | Attending: Internal Medicine | Admitting: Internal Medicine

## 2015-10-24 ENCOUNTER — Emergency Department (HOSPITAL_COMMUNITY): Payer: BLUE CROSS/BLUE SHIELD

## 2015-10-24 ENCOUNTER — Observation Stay (HOSPITAL_COMMUNITY): Payer: BLUE CROSS/BLUE SHIELD

## 2015-10-24 ENCOUNTER — Encounter (HOSPITAL_COMMUNITY): Payer: Self-pay | Admitting: *Deleted

## 2015-10-24 DIAGNOSIS — L03115 Cellulitis of right lower limb: Secondary | ICD-10-CM

## 2015-10-24 DIAGNOSIS — E1151 Type 2 diabetes mellitus with diabetic peripheral angiopathy without gangrene: Secondary | ICD-10-CM | POA: Diagnosis present

## 2015-10-24 DIAGNOSIS — M549 Dorsalgia, unspecified: Secondary | ICD-10-CM

## 2015-10-24 DIAGNOSIS — G894 Chronic pain syndrome: Secondary | ICD-10-CM | POA: Diagnosis present

## 2015-10-24 DIAGNOSIS — I7300 Raynaud's syndrome without gangrene: Secondary | ICD-10-CM | POA: Diagnosis present

## 2015-10-24 DIAGNOSIS — M797 Fibromyalgia: Secondary | ICD-10-CM | POA: Diagnosis present

## 2015-10-24 DIAGNOSIS — Z7952 Long term (current) use of systemic steroids: Secondary | ICD-10-CM

## 2015-10-24 DIAGNOSIS — I10 Essential (primary) hypertension: Secondary | ICD-10-CM | POA: Diagnosis present

## 2015-10-24 DIAGNOSIS — M069 Rheumatoid arthritis, unspecified: Secondary | ICD-10-CM | POA: Diagnosis present

## 2015-10-24 DIAGNOSIS — L0390 Cellulitis, unspecified: Secondary | ICD-10-CM | POA: Diagnosis present

## 2015-10-24 DIAGNOSIS — M7989 Other specified soft tissue disorders: Secondary | ICD-10-CM | POA: Diagnosis not present

## 2015-10-24 DIAGNOSIS — M545 Low back pain: Secondary | ICD-10-CM | POA: Diagnosis present

## 2015-10-24 DIAGNOSIS — Z79899 Other long term (current) drug therapy: Secondary | ICD-10-CM

## 2015-10-24 DIAGNOSIS — W19XXXA Unspecified fall, initial encounter: Secondary | ICD-10-CM | POA: Diagnosis present

## 2015-10-24 DIAGNOSIS — L02611 Cutaneous abscess of right foot: Secondary | ICD-10-CM | POA: Diagnosis not present

## 2015-10-24 DIAGNOSIS — Z881 Allergy status to other antibiotic agents status: Secondary | ICD-10-CM

## 2015-10-24 DIAGNOSIS — K219 Gastro-esophageal reflux disease without esophagitis: Secondary | ICD-10-CM | POA: Diagnosis present

## 2015-10-24 DIAGNOSIS — M3320 Polymyositis, organ involvement unspecified: Secondary | ICD-10-CM | POA: Diagnosis present

## 2015-10-24 DIAGNOSIS — M79671 Pain in right foot: Secondary | ICD-10-CM

## 2015-10-24 DIAGNOSIS — L97513 Non-pressure chronic ulcer of other part of right foot with necrosis of muscle: Secondary | ICD-10-CM

## 2015-10-24 DIAGNOSIS — Z89411 Acquired absence of right great toe: Secondary | ICD-10-CM

## 2015-10-24 DIAGNOSIS — Z91048 Other nonmedicinal substance allergy status: Secondary | ICD-10-CM

## 2015-10-24 DIAGNOSIS — Z885 Allergy status to narcotic agent status: Secondary | ICD-10-CM

## 2015-10-24 DIAGNOSIS — I739 Peripheral vascular disease, unspecified: Secondary | ICD-10-CM | POA: Diagnosis present

## 2015-10-24 DIAGNOSIS — Z89412 Acquired absence of left great toe: Secondary | ICD-10-CM

## 2015-10-24 LAB — COMPREHENSIVE METABOLIC PANEL
ALBUMIN: 2.7 g/dL — AB (ref 3.5–5.0)
ALK PHOS: 134 U/L — AB (ref 38–126)
ALT: 40 U/L (ref 14–54)
AST: 64 U/L — AB (ref 15–41)
Anion gap: 8 (ref 5–15)
BILIRUBIN TOTAL: 0.3 mg/dL (ref 0.3–1.2)
BUN: 6 mg/dL (ref 6–20)
CALCIUM: 8.4 mg/dL — AB (ref 8.9–10.3)
CO2: 25 mmol/L (ref 22–32)
Chloride: 108 mmol/L (ref 101–111)
Creatinine, Ser: 0.66 mg/dL (ref 0.44–1.00)
GFR calc Af Amer: >60 mL/min (ref >60)
GFR calc non Af Amer: >60 mL/min (ref >60)
GLUCOSE: 115 mg/dL — AB (ref 65–99)
Potassium: 3.7 mmol/L (ref 3.5–5.1)
Sodium: 141 mmol/L (ref 135–145)
TOTAL PROTEIN: 8 g/dL (ref 6.5–8.1)

## 2015-10-24 LAB — CBC WITH DIFFERENTIAL/PLATELET
Basophils Absolute: 0 10*3/uL (ref 0.0–0.1)
Basophils Relative: 0 %
EOS PCT: 2 %
Eosinophils Absolute: 0.2 10*3/uL (ref 0.0–0.7)
HEMATOCRIT: 33.8 % — AB (ref 36.0–46.0)
HEMOGLOBIN: 10.6 g/dL — AB (ref 12.0–15.0)
LYMPHS ABS: 1.7 10*3/uL (ref 0.7–4.0)
LYMPHS PCT: 19 %
MCH: 28 pg (ref 26.0–34.0)
MCHC: 31.4 g/dL (ref 30.0–36.0)
MCV: 89.2 fL (ref 78.0–100.0)
MONOS PCT: 3 %
Monocytes Absolute: 0.3 10*3/uL (ref 0.1–1.0)
NEUTROS PCT: 76 %
Neutro Abs: 7 10*3/uL (ref 1.7–7.7)
Platelets: 386 10*3/uL (ref 150–400)
RBC: 3.79 MIL/uL — AB (ref 3.87–5.11)
RDW: 18.9 % — ABNORMAL HIGH (ref 11.5–15.5)
WBC: 9.2 10*3/uL (ref 4.0–10.5)

## 2015-10-24 LAB — CBC
HCT: 32.5 % — ABNORMAL LOW (ref 36.0–46.0)
HEMOGLOBIN: 9.7 g/dL — AB (ref 12.0–15.0)
MCH: 27.1 pg (ref 26.0–34.0)
MCHC: 29.8 g/dL — ABNORMAL LOW (ref 30.0–36.0)
MCV: 90.8 fL (ref 78.0–100.0)
PLATELETS: 348 10*3/uL (ref 150–400)
RBC: 3.58 MIL/uL — AB (ref 3.87–5.11)
RDW: 18.8 % — ABNORMAL HIGH (ref 11.5–15.5)
WBC: 9.4 10*3/uL (ref 4.0–10.5)

## 2015-10-24 LAB — GLUCOSE, CAPILLARY: Glucose-Capillary: 132 mg/dL — ABNORMAL HIGH (ref 65–99)

## 2015-10-24 LAB — I-STAT CG4 LACTIC ACID, ED: Lactic Acid, Venous: 1.76 mmol/L (ref 0.5–2.0)

## 2015-10-24 MED ORDER — PIPERACILLIN-TAZOBACTAM 3.375 G IVPB
3.3750 g | Freq: Once | INTRAVENOUS | Status: AC
  Administered 2015-10-24: 3.375 g via INTRAVENOUS
  Filled 2015-10-24: qty 50

## 2015-10-24 MED ORDER — ONDANSETRON HCL 4 MG/2ML IJ SOLN: 4.0000 mg | Freq: Four times a day (QID) | INTRAMUSCULAR | Status: DC | PRN

## 2015-10-24 MED ORDER — PANTOPRAZOLE SODIUM 40 MG PO TBEC
40.0000 mg | DELAYED_RELEASE_TABLET | Freq: Every day | ORAL | Status: DC
  Administered 2015-10-25 – 2015-11-01 (×7): 40 mg via ORAL
  Filled 2015-10-24 (×8): qty 1

## 2015-10-24 MED ORDER — VANCOMYCIN HCL IN DEXTROSE 1-5 GM/200ML-% IV SOLN
1000.0000 mg | Freq: Once | INTRAVENOUS | Status: AC
  Administered 2015-10-24: 1000 mg via INTRAVENOUS
  Filled 2015-10-24: qty 200

## 2015-10-24 MED ORDER — VANCOMYCIN HCL 10 G IV SOLR
1500.0000 mg | INTRAVENOUS | Status: DC
  Administered 2015-10-25 – 2015-10-26 (×2): 1500 mg via INTRAVENOUS
  Filled 2015-10-24 (×4): qty 1500

## 2015-10-24 MED ORDER — ONDANSETRON HCL 4 MG PO TABS: 4.0000 mg | ORAL_TABLET | Freq: Four times a day (QID) | ORAL | Status: DC | PRN

## 2015-10-24 MED ORDER — DIPHENHYDRAMINE HCL 25 MG PO CAPS
25.0000 mg | ORAL_CAPSULE | Freq: Two times a day (BID) | ORAL | Status: DC | PRN
  Administered 2015-10-25 – 2015-10-31 (×4): 25 mg via ORAL
  Filled 2015-10-24 (×5): qty 1

## 2015-10-24 MED ORDER — DIPHENHYDRAMINE HCL 25 MG PO CAPS: 25.0000 mg | ORAL_CAPSULE | Freq: Once | ORAL | Status: DC

## 2015-10-24 MED ORDER — SODIUM CHLORIDE 0.9 % IV BOLUS (SEPSIS)
1000.0000 mL | Freq: Once | INTRAVENOUS | Status: AC
  Administered 2015-10-24: 1000 mL via INTRAVENOUS

## 2015-10-24 MED ORDER — MORPHINE SULFATE (PF) 2 MG/ML IV SOLN
2.0000 mg | INTRAVENOUS | Status: DC | PRN
  Administered 2015-10-24 – 2015-10-29 (×12): 2 mg via INTRAVENOUS
  Filled 2015-10-24 (×13): qty 1

## 2015-10-24 MED ORDER — ENOXAPARIN SODIUM 40 MG/0.4ML ~~LOC~~ SOLN
40.0000 mg | SUBCUTANEOUS | Status: DC
  Administered 2015-10-24 – 2015-10-27 (×4): 40 mg via SUBCUTANEOUS
  Filled 2015-10-24 (×4): qty 0.4

## 2015-10-24 MED ORDER — VANCOMYCIN HCL IN DEXTROSE 1-5 GM/200ML-% IV SOLN: 1000.0000 mg | Freq: Once | INTRAVENOUS | Status: DC

## 2015-10-24 MED ORDER — ALPRAZOLAM 0.25 MG PO TABS
0.2500 mg | ORAL_TABLET | Freq: Three times a day (TID) | ORAL | Status: DC | PRN
  Administered 2015-10-29 – 2015-10-30 (×2): 0.25 mg via ORAL
  Filled 2015-10-24 (×2): qty 1

## 2015-10-24 MED ORDER — SODIUM CHLORIDE 0.9 % IV SOLN: INTRAVENOUS | Status: DC

## 2015-10-24 MED ORDER — ESCITALOPRAM OXALATE 10 MG PO TABS
10.0000 mg | ORAL_TABLET | Freq: Every day | ORAL | Status: DC
  Administered 2015-10-25 – 2015-11-01 (×7): 10 mg via ORAL
  Filled 2015-10-24 (×8): qty 1

## 2015-10-24 MED ORDER — HYDROMORPHONE HCL 1 MG/ML IJ SOLN
0.5000 mg | Freq: Once | INTRAMUSCULAR | Status: AC
  Administered 2015-10-24: 1 mg via INTRAVENOUS
  Filled 2015-10-24: qty 1

## 2015-10-24 MED ORDER — GADOBENATE DIMEGLUMINE 529 MG/ML IV SOLN
13.0000 mL | Freq: Once | INTRAVENOUS | Status: AC | PRN
  Administered 2015-10-24: 13 mL via INTRAVENOUS

## 2015-10-24 MED ORDER — AMLODIPINE BESYLATE 10 MG PO TABS
10.0000 mg | ORAL_TABLET | Freq: Every day | ORAL | Status: DC
  Administered 2015-10-25 – 2015-11-01 (×7): 10 mg via ORAL
  Filled 2015-10-24 (×8): qty 1

## 2015-10-24 MED ORDER — HYDROCODONE-ACETAMINOPHEN 5-325 MG PO TABS: 1.0000 | ORAL_TABLET | Freq: Once | ORAL | Status: DC

## 2015-10-24 MED ORDER — PREDNISONE 10 MG PO TABS
10.0000 mg | ORAL_TABLET | Freq: Every day | ORAL | Status: DC
  Administered 2015-10-25 – 2015-11-01 (×7): 10 mg via ORAL
  Filled 2015-10-24 (×7): qty 1

## 2015-10-24 NOTE — ED Notes (Signed)
Pt has amputations of great toes bilateral . Pt D/C  from hospital in [facility] on 10-19-18. Pt was instrucred to come local Hospital on arrival to home. Pt local MD Fabienne Bruns vascular.

## 2015-10-24 NOTE — H&P (Signed)
PCP:   No PCP Per Patient   Chief Complaint:  Right foot pain and swelling   HPI: 54 year old female who   has a past medical history of Osteoarthritis; Osteopenia; Raynaud phenomenon; Constipation; Shortness of breath; GERD (gastroesophageal reflux disease); Headache(784.0); Anemia; Hypertension; Seizures (HCC); Peripheral vascular disease (HCC); Depression; Anxiety; Type II diabetes mellitus (HCC); Polymyositis (HCC); Fibromyalgia; Pneumonia (03/2012); and Polymyositis (HCC).  Today presents to the hospital with worsening pain and swelling of the right foot. 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 She was recently admitted at a hospital in [facility] from December 23 to December 27 for right foot cellulitis, and was treated with antibiotics. Patient demanded to go home so she was discharged Augmentin. Patient did not get that filled. Yesterday patient was unable to walk on the foot and fell hitting her back. So patient came to the hospital for further evaluation of the right foot cellulitis. MRI of the foot was done which rules out osteomyelitis but show swelling consistent with cellulitis versus abscess. She denies any fever, complains of back pain from falling. Patient has a history of rheumatoid arthritis, polymyositis and takes prednisone 10 mg by mouth daily.   Allergies:   Allergies  Allergen Reactions  . Clindamycin/Lincomycin Itching and Rash    Possible reaction (took clindamycin and cipro at the same time)  . Doxycycline Nausea Only  . Percocet [Oxycodone-Acetaminophen] Itching  . Tape Itching and Other (See Comments)    Plastic tape causes itching-USE PAPER TAPE ONLY  . Mycophenolate Diarrhea  . Ciprofloxacin Itching and Rash    Possible reaction (took clindamycin and cipro at the same time)       Past Medical History  Diagnosis Date  . Osteoarthritis   . Osteopenia   . Raynaud phenomenon   . Constipation   . Shortness  of breath   . GERD (gastroesophageal reflux disease)   . Headache(784.0)   . Anemia   . Hypertension   . Seizures (HCC)     with chid birth 27f 2 childre  . Peripheral vascular disease (HCC)   . Depression   . Anxiety   . Type II diabetes mellitus (HCC)   . Polymyositis (HCC)   . Fibromyalgia   . Pneumonia 03/2012  . Polymyositis [facility])     Past Surgical History  Procedure Laterality Date  . Cesarean section      x3  . Tubal ligation    . Hysteroscopy      w/ D&C with excision of endometrial polyps  . Left great toenail removal  Left 2013    done by [place] [facility]  . Tonsillectomy Bilateral   . Amputation Left 12/24/2012    Procedure: AMPUTATION DIGIT left great toe;  Surgeon: Sherren Kerns, MD;  Location: [facility] OR;  Service: Vascular;  Laterality: Left;  GREAT  . Abdominal aortagram N/A 07/25/2014    Procedure: ABDOMINAL Ronny Flurry;  Surgeon: Sherren Kerns, MD;  Location: [facility] CATH LAB;  Service: Cardiovascular;  Laterality: N/A;  . Peripheral vascular catheterization N/A 05/15/2015    Procedure: Abdominal Aortogram;  Surgeon: Sherren Kerns, MD;  Location: [facility] INVASIVE CV LAB;  Service: Cardiovascular;  Laterality: N/A;  . Colonoscopy    . Amputation Right 05/19/2015    Procedure: AMPUTATION DIGIT;  Surgeon: Sherren Kerns, MD;  Location: [facility] OR;  Service: Vascular;  Laterality: Right;  . Amputation Right 07/22/2015    Procedure: RAY AMPUTATION OF RIGHT GREAT TOE AMPUTATION  SITE;  Surgeon: Sherren Kerns, MD;  Location: [facility] OR;  Service: Vascular;  Laterality: Right;    Prior to Admission medications   Medication Sig Start Date End Date Taking? Authorizing Provider  amLODipine (NORVASC) 10 MG tablet Take 10 mg by mouth daily.   Yes Historical Provider, MD  cephALEXin (KEFLEX) 500 MG capsule Take 1 capsule (500 mg total) by mouth 3 (three) times daily. 10/01/15  Yes Sherren Kerns, MD  diphenhydrAMINE (BENADRYL) 25 MG tablet Take 25 mg by mouth 2 (two) times daily as needed  for itching.   Yes Historical Provider, MD  escitalopram (LEXAPRO) 10 MG tablet Take 10 mg by mouth daily.   Yes Historical Provider, MD  omeprazole (PRILOSEC) 40 MG capsule Take 40 mg by mouth daily.    Yes Historical Provider, MD  oxyCODONE-acetaminophen (PERCOCET/ROXICET) 5-325 MG tablet Take 1 tablet by mouth every 8 (eight) hours as needed for moderate pain. pain 08/27/15  Yes Sherren Kerns, MD  predniSONE (DELTASONE) 10 MG tablet Take 10 mg by mouth daily with breakfast.   Yes Historical Provider, MD  ALPRAZolam (XANAX) 0.25 MG tablet Take 1 tablet (0.25 mg total) by mouth 3 (three) times daily as needed for anxiety. 07/25/15   Raymond Gurney, PA-C  Chlorpheniramine-Phenylephrine (SINUS/ALLERGY PE PO) Take 1 tablet by mouth 2 (two) times daily as needed (sinus drainage).    Historical Provider, MD  levofloxacin (LEVAQUIN) 500 MG tablet Take 1 tablet (500 mg total) by mouth daily. Patient not taking: Reported on 10/01/2015 08/18/15   Carma Lair Nickel, NP    Social History:  reports that she has never smoked. She has never used smokeless tobacco. She reports that she drinks alcohol. She reports that she does not use illicit drugs.  Family History  Problem Relation Age of Onset  . Diabetes Mother   . Hypertension Mother   . Diabetes Father   . Heart disease Father   . Hypertension Sister   . Heart disease Sister     Ceasar Mons Weights   10/24/15 1022  Weight: 58.968 kg (130 lb)    All the positives are listed in BOLD  Review of Systems:  HEENT: Headache, blurred vision, runny nose, sore throat Neck: Hypothyroidism, hyperthyroidism,,lymphadenopathy Chest : Shortness of breath, history of COPD, Asthma Heart : Chest pain, history of coronary arterey disease GI:  Nausea, vomiting, diarrhea, constipation, GERD GU: Dysuria, urgency, frequency of urination, hematuria Neuro: Stroke, seizures, syncope Psych: Depression, anxiety, hallucinations   Physical Exam: Blood pressure 123/70,  pulse 100, temperature 98.7 F (37.1 C), temperature source Oral, resp. rate 17, height 5\' 1"  (1.549 m), weight 58.968 kg (130 lb), last menstrual period 06/20/2012, SpO2 99 %. Constitutional:   Patient is a well-developed and well-nourished female* in no acute distress and cooperative with exam. Head: Normocephalic and atraumatic Mouth: Mucus membranes moist Eyes: PERRL, EOMI, conjunctivae normal Neck: Supple, No Thyromegaly Cardiovascular: RRR, S1 normal, S2 normal Pulmonary/Chest: CTAB, no wheezes, rales, or rhonchi Abdominal: Soft. Non-tender, non-distended, bowel sounds are normal, no masses, organomegaly, or guarding present.  Neurological: A&O x3, Strength is normal and symmetric bilaterally, cranial nerve II-XII are grossly intact, no focal motor deficit, sensory intact to light touch bilaterally.    Foot- right foot erythema noted on the plantar surface as above. Tender to palpation, mildly fluctuant, warm to touch.  Labs on Admission:  Basic Metabolic Panel:  Recent Labs Lab 10/24/15 1153  NA 141  K 3.7  CL 108  CO2 25  GLUCOSE 115*  BUN 6  CREATININE 0.66  CALCIUM 8.4*   Liver Function Tests:  Recent Labs Lab 10/24/15 1153  AST 64*  ALT 40  ALKPHOS 134*  BILITOT 0.3  PROT 8.0  ALBUMIN 2.7*   CBC:  Recent Labs Lab 10/24/15 1153  WBC 9.2  NEUTROABS 7.0  HGB 10.6*  HCT 33.8*  MCV 89.2  PLT 386    Radiological Exams on Admission: Mr Foot Right W Wo Contrast  10/24/2015  CLINICAL DATA:  Worsening pain and swelling in the foot. Prior right great toe amputation with debridement 7 days ago. EXAM: MRI OF THE RIGHT FOREFOOT WITHOUT AND WITH CONTRAST TECHNIQUE: Multiplanar, multisequence MR imaging was performed both before and after administration of intravenous contrast. The patient was only able to tolerate 1 post-contrast plain before terminating the exam due to discomfort. CONTRAST:  13 cc MultiHance COMPARISON:  04/26/2015 FINDINGS: Osteomyelitis  protocol MRI of the foot was obtained, to include the entire foot and ankle. This protocol uses a large field of view to cover the entire foot and ankle, and is suitable for assessing bony structures for osteomyelitis. Due to the large field of view and imaging plane choice, this protocol is less sensitive for assessing small structures such as ligamentous structures of the foot and ankle, compared to a dedicated forefoot or dedicated hindfoot exam. There is an unusual appearance of the amputation site. Much of the first metatarsal has been resected but there is a thin lateral rim of metatarsal cortex extending distally to the expected vicinity of the metatarsal head. The first digit sesamoids are visible on image 49 series 9, but without significant adjacent support structure and with apparent discontinuity of the flexor hallucis longus just proximal to the sesamoids between images 47 and 48 of series 9. Overlying wound noted at the metatarsal resection site dorsum medially. There is subcutaneous edema and enhancement tracking along the plantar foot, plantar musculature of the foot, and into the toes, suspicious for cellulitis. Additionally, along the plantar subcutaneous tissues of the foot there is extensive high T2 signal with hypoenhancement favoring inflammatory phlegmon or potentially early necrotic tissue. No gas tracks in this vicinity. There is a lesser degree of rind like marginal enhancement and more internal high T1 reticulation within mass region than I would expect for overt abscess. Correlate with any fluctuance along the plantar midfoot and metatarsals. I do not identify any overt osteomyelitis of the remaining bony structures. : IMPRESSION: 1. Unusual resection of the first metatarsal appears to leave the metatarsal base and the lateral cortex of the shaft extending to the expected location of the first metatarsal head, but the rest of the bone is absent. First digit sesamoids are still present. No  active osteomyelitis currently seen. There is an overlying dorsomedial wound along the metatarsal resection site. Consider conventional radiography of the foot to further correlate with the expected post amputation appearance. 2. Cellulitis in the foot, with suspected extensive phlegmon and possibly some early necrotic tissue along the plantar foot, correlate with any fluctuance in this vicinity in assessing for subcutaneous abscesses. 3. Low-level enhancement along the plantar musculature of the foot may reflect myositis. Electronically Signed   By: Gaylyn Rong M.D.   On: 10/24/2015 14:16       Assessment/Plan Active Problems:   Cellulitis   Polymyositis  Cellulitis- will admit the patient, start vancomycin per pharmacy consultation. If patient does not improve in next 24-48 hours consider orthopedic consultation for possible incision and drainage.  Polymyositis- continue prednisone 10 mg by mouth daily  Back pain- patient complains of back pain after falling last night, will obtain thoracolumbar spine x-ray. Will follow the x-ray.  Chronic pain syndrome- continue Lexapro  DVT prophylaxis- Lovenox  Code status: Full code  Family discussion: No family present at bedside   Time Spent on Admission: 60 min  LAMA,GAGAN S [facility] Pager: [phone] 10/24/2015, 4:12 PM  If 7PM-7AM, please contact night-coverage  [email or web address]  Password [email or web address]

## 2015-10-24 NOTE — ED Provider Notes (Signed)
CSN: [id number]     Arrival date & time 10/24/15  1002 History  By signing my name below, I, Elon Spanner, attest that this documentation has been prepared under the direction and in the presence of [facility], PA-C. Electronically Signed: Elon Spanner ED Scribe. 10/24/2015. 4:03 PM.    Chief Complaint  Patient presents with  . Foot Pain  . Foot Swelling  . Cellulitis   The history is provided by the patient. No language interpreter was used.   HPI Comments: Allison Beasley is a 54 y.o. female who presents to the Emergency Department complaining of worsening pain and swelling at the sight of a right great toe amputation onset 7 days ago.  The pain is worse with touch and she is unable to bear weight due to pain.  The patient reports the toe was amputated 2/16 by Dr. Darrick Penna and then surgically addressed again 03/2015 for infection.  The patient was discharged 5 days ago from [facility] for today's complaints.  She reports receiving plain films, CT, and MRI of the foot as well as IV antibiotics.  She was discharged with instructions to f/u with Dr. Darrick Penna, however, he is out of the office for 2 weeks.  The patient is currently taking levofloxacin and has not taken the amoxicillin rx'd on discharge from [facility].  The patient denies fever since discharge.   Past Medical History  Diagnosis Date  . Osteoarthritis   . Osteopenia   . Raynaud phenomenon   . Constipation   . Shortness of breath   . GERD (gastroesophageal reflux disease)   . Headache(784.0)   . Anemia   . Hypertension   . Seizures (HCC)     with chid birth 40f 2 childre  . Peripheral vascular disease (HCC)   . Depression   . Anxiety   . Type II diabetes mellitus (HCC)   . Polymyositis (HCC)   . Fibromyalgia   . Pneumonia 03/2012  . Polymyositis [facility])    Past Surgical History  Procedure Laterality Date  . Cesarean section      x3  . Tubal ligation    . Hysteroscopy      w/ D&C with excision of  endometrial polyps  . Left great toenail removal  Left 2013    done by [place] [facility]  . Tonsillectomy Bilateral   . Amputation Left 12/24/2012    Procedure: AMPUTATION DIGIT left great toe;  Surgeon: Sherren Kerns, MD;  Location: [facility] OR;  Service: Vascular;  Laterality: Left;  GREAT  . Abdominal aortagram N/A 07/25/2014    Procedure: ABDOMINAL Ronny Flurry;  Surgeon: Sherren Kerns, MD;  Location[facility] CATH LAB;  Service: Cardiovascular;  Laterality: N/A;  . Peripheral vascular catheterization N/A 05/15/2015    Procedure: Abdominal Aortogram;  Surgeon: Sherren Kerns, MD;  Location: [facility] INVASIVE CV LAB;  Service: Cardiovascular;  Laterality: N/A;  . Colonoscopy    . Amputation Right 05/19/2015    Procedure: AMPUTATION DIGIT;  Surgeon: Sherren Kerns, MD;  Location: [facility] OR;  Service: Vascular;  Laterality: Right;  . Amputation Right 07/22/2015    Procedure: RAY AMPUTATION OF RIGHT GREAT TOE AMPUTATION SITE;  Surgeon: Sherren Kerns, MD;  Location: [facility] OR;  Service: Vascular;  Laterality: Right;   Family History  Problem Relation Age of Onset  . Diabetes Mother   . Hypertension Mother   . Diabetes Father   . Heart disease Father   . Hypertension Sister   .  Heart disease Sister    Social History  Substance Use Topics  . Smoking status: Never Smoker   . Smokeless tobacco: Never Used  . Alcohol Use: 0.0 oz/week    0 Standard drinks or equivalent per week     Comment: 05/19/2015 "might have a couple drinks/yr"   OB History    Gravida Para Term Preterm AB TAB SAB Ectopic Multiple Living   4 3   1     3      Review of Systems  Constitutional: Negative for fever and chills.  HENT: Negative for congestion.   Eyes: Negative for visual disturbance.  Respiratory: Negative for cough and shortness of breath.   Cardiovascular: Negative for chest pain.  Gastrointestinal: Negative for nausea, vomiting and abdominal pain.  Genitourinary: Negative for dysuria.  Musculoskeletal: Positive for  joint swelling and arthralgias.  Neurological: Negative for dizziness, weakness and headaches.  Hematological: Does not bruise/bleed easily.      Allergies  Clindamycin/lincomycin; Doxycycline; Percocet; Tape; Mycophenolate; and Ciprofloxacin  Home Medications   Prior to Admission medications   Medication Sig Start Date End Date Taking? Authorizing Provider  amLODipine (NORVASC) 10 MG tablet Take 10 mg by mouth daily.   Yes Historical Provider, MD  cephALEXin (KEFLEX) 500 MG capsule Take 1 capsule (500 mg total) by mouth 3 (three) times daily. 10/01/15  Yes 14/8/16, MD  diphenhydrAMINE (BENADRYL) 25 MG tablet Take 25 mg by mouth 2 (two) times daily as needed for itching.   Yes Historical Provider, MD  escitalopram (LEXAPRO) 10 MG tablet Take 10 mg by mouth daily.   Yes Historical Provider, MD  omeprazole (PRILOSEC) 40 MG capsule Take 40 mg by mouth daily.    Yes Historical Provider, MD  oxyCODONE-acetaminophen (PERCOCET/ROXICET) 5-325 MG tablet Take 1 tablet by mouth every 8 (eight) hours as needed for moderate pain. pain 08/27/15  Yes 13/3/16, MD  predniSONE (DELTASONE) 10 MG tablet Take 10 mg by mouth daily with breakfast.   Yes Historical Provider, MD  ALPRAZolam (XANAX) 0.25 MG tablet Take 1 tablet (0.25 mg total) by mouth 3 (three) times daily as needed for anxiety. 07/25/15   09/24/15, PA-C  Chlorpheniramine-Phenylephrine (SINUS/ALLERGY PE PO) Take 1 tablet by mouth 2 (two) times daily as needed (sinus drainage).    Historical Provider, MD  levofloxacin (LEVAQUIN) 500 MG tablet Take 1 tablet (500 mg total) by mouth daily. Patient not taking: Reported on 10/01/2015 08/18/15   08/20/15 Nickel, NP   BP 123/70 mmHg  Pulse 100  Temp(Src) 98.7 F (37.1 C) (Oral)  Resp 17  Ht 5\' 1"  (1.549 m)  Wt 58.968 kg  BMI 24.58 kg/m2  SpO2 99%  LMP 06/20/2012 Physical Exam  Constitutional: She is oriented to person, place, and time. She appears well-developed and  well-nourished. No distress.  HENT:  Head: Normocephalic and atraumatic.  Neck: Normal range of motion. Neck supple. No tracheal deviation present.  Cardiovascular: Normal rate, regular rhythm and normal heart sounds.  Exam reveals no gallop and no friction rub.   No murmur heard. Pulmonary/Chest: Effort normal and breath sounds normal. No respiratory distress. She has no wheezes. She has no rales.  Abdominal: Soft. She exhibits no distension. There is no tenderness.  Musculoskeletal:  Bilateral feet with great toe amputated. Right foot with increased swelling and erythema Decreased ROM 2/2 pain Intact and equal distal pulses  Lymphadenopathy:    She has no cervical adenopathy.  Neurological: She is alert and oriented  to person, place, and time.  Skin: Skin is warm and dry.  Psychiatric: She has a normal mood and affect. Her behavior is normal.  Nursing note and vitals reviewed.   ED Course  Procedures (including critical care time)  DIAGNOSTIC STUDIES: Oxygen Saturation is 99% on RA, normal by my interpretation.    COORDINATION OF CARE:  4:03 PM Discussed treatment plan with patient at bedside.  Patient acknowledges and agrees with plan.    Labs Review Labs Reviewed  CBC WITH DIFFERENTIAL/PLATELET - Abnormal; Notable for the following:    RBC 3.79 (*)    Hemoglobin 10.6 (*)    HCT 33.8 (*)    RDW 18.9 (*)    All other components within normal limits  COMPREHENSIVE METABOLIC PANEL - Abnormal; Notable for the following:    Glucose, Bld 115 (*)    Calcium 8.4 (*)    Albumin 2.7 (*)    AST 64 (*)    Alkaline Phosphatase 134 (*)    All other components within normal limits  CULTURE, BLOOD (ROUTINE X 2)  CULTURE, BLOOD (ROUTINE X 2)  I-STAT CG4 LACTIC ACID, ED    Imaging Review Mr Foot Right W Wo Contrast  10/24/2015  CLINICAL DATA:  Worsening pain and swelling in the foot. Prior right great toe amputation with debridement 7 days ago. EXAM: MRI OF THE RIGHT FOREFOOT  WITHOUT AND WITH CONTRAST TECHNIQUE: Multiplanar, multisequence MR imaging was performed both before and after administration of intravenous contrast. The patient was only able to tolerate 1 post-contrast plain before terminating the exam due to discomfort. CONTRAST:  13 cc MultiHance COMPARISON:  04/26/2015 FINDINGS: Osteomyelitis protocol MRI of the foot was obtained, to include the entire foot and ankle. This protocol uses a large field of view to cover the entire foot and ankle, and is suitable for assessing bony structures for osteomyelitis. Due to the large field of view and imaging plane choice, this protocol is less sensitive for assessing small structures such as ligamentous structures of the foot and ankle, compared to a dedicated forefoot or dedicated hindfoot exam. There is an unusual appearance of the amputation site. Much of the first metatarsal has been resected but there is a thin lateral rim of metatarsal cortex extending distally to the expected vicinity of the metatarsal head. The first digit sesamoids are visible on image 49 series 9, but without significant adjacent support structure and with apparent discontinuity of the flexor hallucis longus just proximal to the sesamoids between images 47 and 48 of series 9. Overlying wound noted at the metatarsal resection site dorsum medially. There is subcutaneous edema and enhancement tracking along the plantar foot, plantar musculature of the foot, and into the toes, suspicious for cellulitis. Additionally, along the plantar subcutaneous tissues of the foot there is extensive high T2 signal with hypoenhancement favoring inflammatory phlegmon or potentially early necrotic tissue. No gas tracks in this vicinity. There is a lesser degree of rind like marginal enhancement and more internal high T1 reticulation within mass region than I would expect for overt abscess. Correlate with any fluctuance along the plantar midfoot and metatarsals. I do not identify  any overt osteomyelitis of the remaining bony structures. : IMPRESSION: 1. Unusual resection of the first metatarsal appears to leave the metatarsal base and the lateral cortex of the shaft extending to the expected location of the first metatarsal head, but the rest of the bone is absent. First digit sesamoids are still present. No active osteomyelitis currently seen. There  is an overlying dorsomedial wound along the metatarsal resection site. Consider conventional radiography of the foot to further correlate with the expected post amputation appearance. 2. Cellulitis in the foot, with suspected extensive phlegmon and possibly some early necrotic tissue along the plantar foot, correlate with any fluctuance in this vicinity in assessing for subcutaneous abscesses. 3. Low-level enhancement along the plantar musculature of the foot may reflect myositis. Electronically Signed   By: Gaylyn Rong M.D.   On: 10/24/2015 14:16   I have personally reviewed and evaluated these images and lab results as part of my medical decision-making.   EKG Interpretation None      MDM   Final diagnoses:  Foot pain, right   SHELINA LUO presents with increased pain and swelling of right foot. After further discussion with patient, she informed us she was admitted in [facility] while on vacation - she was discharged and told to come straight home and go straight to the hospital.   Labs: Lactic wdl; CMP and CMP reassuring; blood cx x 2 ordered.   Imaging: MR shows cellulitis with extensive phlegmon and possible necrotic tissue.   Consults:Medicine  Therapeutics:Dilaudid, Benadryl; vanc & zosyn   A&P: Cellulitis  - Consulted Medicine who will admit for further eval and management.   Patient seen by and discussed with Dr. Denton Lank who agrees with treatment plan.   I personally performed the services described in this documentation, which was scribed in my presence. The recorded information has been reviewed and  is accurate.   [facility] Allison Luu, PA-C 10/24/15 1609  Cathren Laine, MD 10/25/15 [phone]

## 2015-10-24 NOTE — Progress Notes (Signed)
ANTIBIOTIC CONSULT NOTE - INITIAL  Pharmacy Consult for Vancomycin Indication: cellulitis  Allergies  Allergen Reactions  . Clindamycin/Lincomycin Itching and Rash    Possible reaction (took clindamycin and cipro at the same time)  . Doxycycline Nausea Only  . Percocet [Oxycodone-Acetaminophen] Itching  . Tape Itching and Other (See Comments)    Plastic tape causes itching-USE PAPER TAPE ONLY  . Mycophenolate Diarrhea  . Ciprofloxacin Itching and Rash    Possible reaction (took clindamycin and cipro at the same time)     Patient Measurements: Height: 5\' 1"  (154.9 cm) Weight: 130 lb (58.968 kg) IBW/kg (Calculated) : 47.8  Vital Signs: Temp: 99.7 F (37.6 C) (12/31 1803) Temp Source: Oral (12/31 1803) BP: 133/81 mmHg (12/31 1803) Pulse Rate: 90 (12/31 1803) Intake/Output from previous day:   Intake/Output from this shift:    Labs:  Recent Labs  10/24/15 1153  WBC 9.2  HGB 10.6*  PLT 386  CREATININE 0.66   Estimated Creatinine Clearance: 65.6 mL/min (by C-G formula based on Cr of 0.66). No results for input(s): VANCOTROUGH, VANCOPEAK, VANCORANDOM, GENTTROUGH, GENTPEAK, GENTRANDOM, TOBRATROUGH, TOBRAPEAK, TOBRARND, AMIKACINPEAK, AMIKACINTROU, AMIKACIN in the last 72 hours.   Microbiology: No results found for this or any previous visit (from the past 720 hour(s)).  Medical History: Past Medical History  Diagnosis Date  . Osteoarthritis   . Osteopenia   . Raynaud phenomenon   . Constipation   . Shortness of breath   . GERD (gastroesophageal reflux disease)   . Headache(784.0)   . Anemia   . Hypertension   . Seizures (HCC)     with chid birth 66f 2 childre  . Peripheral vascular disease (HCC)   . Depression   . Anxiety   . Type II diabetes mellitus (HCC)   . Polymyositis (HCC)   . Fibromyalgia   . Pneumonia 03/2012  . Polymyositis (HCC)    Assessment: 54yo F presents to ED with worsening pain and swelling of the right foot. Patient has history of  bilateral big toe amputation and recent admission in [facility] 12/23 - 12/27 for R foot cellulitis. Patient discharged on Augmentin but never filled prescription. MRI negative for osteomyelitis. Pharmacy consulted to dose Vancomycin. Patient received one dose of zosyn and vancomycin in the ED.  Tmax 99.7, WBC wnl, SCr 0.66, CrCl ~65 ml/min  Goal of Therapy:  Vancomycin trough level 10-15 mcg/ml  Plan:  - Vancomycin 1500 mg IV q24h - Monitor C&S, vitals and clinical progression - Monitor renal function and VT at steady state  10-17-1993, PharmD. PGY-1 Pharmacy Resident Pager: [phone]  10/24/2015,6:14 PM

## 2015-10-24 NOTE — ED Notes (Signed)
Placed pt in gown

## 2015-10-25 DIAGNOSIS — I739 Peripheral vascular disease, unspecified: Secondary | ICD-10-CM | POA: Diagnosis not present

## 2015-10-25 DIAGNOSIS — M545 Low back pain: Secondary | ICD-10-CM | POA: Diagnosis present

## 2015-10-25 DIAGNOSIS — M797 Fibromyalgia: Secondary | ICD-10-CM | POA: Diagnosis present

## 2015-10-25 DIAGNOSIS — Z7952 Long term (current) use of systemic steroids: Secondary | ICD-10-CM | POA: Diagnosis not present

## 2015-10-25 DIAGNOSIS — Z91048 Other nonmedicinal substance allergy status: Secondary | ICD-10-CM | POA: Diagnosis not present

## 2015-10-25 DIAGNOSIS — G894 Chronic pain syndrome: Secondary | ICD-10-CM | POA: Diagnosis present

## 2015-10-25 DIAGNOSIS — L02611 Cutaneous abscess of right foot: Secondary | ICD-10-CM | POA: Diagnosis present

## 2015-10-25 DIAGNOSIS — Z885 Allergy status to narcotic agent status: Secondary | ICD-10-CM | POA: Diagnosis not present

## 2015-10-25 DIAGNOSIS — I10 Essential (primary) hypertension: Secondary | ICD-10-CM | POA: Diagnosis present

## 2015-10-25 DIAGNOSIS — M79671 Pain in right foot: Secondary | ICD-10-CM | POA: Diagnosis not present

## 2015-10-25 DIAGNOSIS — Z881 Allergy status to other antibiotic agents status: Secondary | ICD-10-CM | POA: Diagnosis not present

## 2015-10-25 DIAGNOSIS — E119 Type 2 diabetes mellitus without complications: Secondary | ICD-10-CM | POA: Diagnosis not present

## 2015-10-25 DIAGNOSIS — E1151 Type 2 diabetes mellitus with diabetic peripheral angiopathy without gangrene: Secondary | ICD-10-CM | POA: Diagnosis present

## 2015-10-25 DIAGNOSIS — K219 Gastro-esophageal reflux disease without esophagitis: Secondary | ICD-10-CM | POA: Diagnosis present

## 2015-10-25 DIAGNOSIS — I7300 Raynaud's syndrome without gangrene: Secondary | ICD-10-CM | POA: Diagnosis present

## 2015-10-25 DIAGNOSIS — Z79899 Other long term (current) drug therapy: Secondary | ICD-10-CM | POA: Diagnosis not present

## 2015-10-25 DIAGNOSIS — M069 Rheumatoid arthritis, unspecified: Secondary | ICD-10-CM | POA: Diagnosis present

## 2015-10-25 DIAGNOSIS — Z89411 Acquired absence of right great toe: Secondary | ICD-10-CM | POA: Diagnosis not present

## 2015-10-25 DIAGNOSIS — W19XXXA Unspecified fall, initial encounter: Secondary | ICD-10-CM | POA: Diagnosis present

## 2015-10-25 DIAGNOSIS — M3320 Polymyositis, organ involvement unspecified: Secondary | ICD-10-CM | POA: Diagnosis present

## 2015-10-25 DIAGNOSIS — L03115 Cellulitis of right lower limb: Secondary | ICD-10-CM | POA: Diagnosis present

## 2015-10-25 DIAGNOSIS — M7989 Other specified soft tissue disorders: Secondary | ICD-10-CM | POA: Diagnosis present

## 2015-10-25 DIAGNOSIS — Z89412 Acquired absence of left great toe: Secondary | ICD-10-CM | POA: Diagnosis not present

## 2015-10-25 LAB — CBC
HCT: 31.7 % — ABNORMAL LOW (ref 36.0–46.0)
Hemoglobin: 9.9 g/dL — ABNORMAL LOW (ref 12.0–15.0)
MCH: 28.1 pg (ref 26.0–34.0)
MCHC: 31.2 g/dL (ref 30.0–36.0)
MCV: 90.1 fL (ref 78.0–100.0)
PLATELETS: 392 10*3/uL (ref 150–400)
RBC: 3.52 MIL/uL — AB (ref 3.87–5.11)
RDW: 18.9 % — AB (ref 11.5–15.5)
WBC: 10.9 10*3/uL — ABNORMAL HIGH (ref 4.0–10.5)

## 2015-10-25 LAB — COMPREHENSIVE METABOLIC PANEL
ALT: 29 U/L (ref 14–54)
ANION GAP: 9 (ref 5–15)
AST: 47 U/L — ABNORMAL HIGH (ref 15–41)
Albumin: 2.2 g/dL — ABNORMAL LOW (ref 3.5–5.0)
Alkaline Phosphatase: 106 U/L (ref 38–126)
BUN: <5 mg/dL — ABNORMAL LOW (ref 6–20)
CHLORIDE: 106 mmol/L (ref 101–111)
CO2: 24 mmol/L (ref 22–32)
CREATININE: 0.62 mg/dL (ref 0.44–1.00)
Calcium: 7.9 mg/dL — ABNORMAL LOW (ref 8.9–10.3)
Glucose, Bld: 92 mg/dL (ref 65–99)
Potassium: 3.5 mmol/L (ref 3.5–5.1)
SODIUM: 139 mmol/L (ref 135–145)
Total Bilirubin: 0.5 mg/dL (ref 0.3–1.2)
Total Protein: 7 g/dL (ref 6.5–8.1)

## 2015-10-25 LAB — GLUCOSE, CAPILLARY
GLUCOSE-CAPILLARY: 86 mg/dL (ref 65–99)
GLUCOSE-CAPILLARY: 141 mg/dL — AB (ref 65–99)
GLUCOSE-CAPILLARY: 139 mg/dL — AB (ref 65–99)
GLUCOSE-CAPILLARY: 157 mg/dL — AB (ref 65–99)

## 2015-10-25 LAB — SURGICAL PCR SCREEN
MRSA, PCR: NEGATIVE
Staphylococcus aureus: NEGATIVE

## 2015-10-25 MED ORDER — INSULIN ASPART 100 UNIT/ML ~~LOC~~ SOLN
0.0000 [IU] | Freq: Three times a day (TID) | SUBCUTANEOUS | Status: DC
  Administered 2015-10-25 – 2015-10-28 (×5): 2 [IU] via SUBCUTANEOUS
  Administered 2015-10-30: 3 [IU] via SUBCUTANEOUS
  Administered 2015-10-30: 2 [IU] via SUBCUTANEOUS
  Administered 2015-10-31: 3 [IU] via SUBCUTANEOUS

## 2015-10-25 MED ORDER — INSULIN ASPART 100 UNIT/ML ~~LOC~~ SOLN: 0.0000 [IU] | Freq: Every day | SUBCUTANEOUS | Status: DC

## 2015-10-25 MED ORDER — HYDROCODONE-ACETAMINOPHEN 5-325 MG PO TABS
1.0000 | ORAL_TABLET | ORAL | Status: DC | PRN
  Administered 2015-10-25 – 2015-11-01 (×27): 2 via ORAL
  Filled 2015-10-25 (×27): qty 2

## 2015-10-25 MED ORDER — VANCOMYCIN HCL IN DEXTROSE 1-5 GM/200ML-% IV SOLN: 1000.0000 mg | INTRAVENOUS | Status: DC

## 2015-10-25 NOTE — Progress Notes (Addendum)
[facility] Progress Note   Allison Beasley  OIB:[id number]  DOB: [birth year]  DOA: 10/24/2015 PCP: No PCP Per Patient  Brief narrative: Allison Beasley is a 55 y.o. female presents with pain and swelling of right foot and is admitted for cellulitis. She was given Augmentin by Dr fields prior to [facility] for this but the swelling worsened when she flew to [facility]. She was hospitalized and discharged on 2 antibiotics- was unable to fill one but continued to take the Augmentin. Swelling worsened once again and she presented the [facility] for it.    Subjective: Significant amount of pain in the foot still. Not able to ambulate. No fevers.   Assessment/Plan: Active Problems:   Cellulitis- plantar aspect right foot - failed treatment with Augmentin- second hospitalization for this - she failed to pick up the second antibiotic she was prescribed on discharge but continued to take the Augmentin which she had already failed. - cont Vanc and Zosyn - MRI negative for osteo- may have abscess, have asked Dr Charlann Boxer for a consult  Polymyositis - cont Prednisone  Diet control DM - start on ISS  Lower back pain - after a fall this week  Multiple amputated toes   Antibiotics: Anti-infectives    Start     Dose/Rate Route Frequency Ordered Stop   10/25/15 1600  vancomycin (VANCOCIN) 1,500 mg in sodium chloride 0.9 % 500 mL IVPB     1,500 mg 250 mL/hr over 120 Minutes Intravenous Every 24 hours 10/24/15 1822     10/24/15 1945  vancomycin (VANCOCIN) IVPB 1000 mg/200 mL premix  Status:  Discontinued     1,000 mg 200 mL/hr over 60 Minutes Intravenous  Once 10/24/15 1938 10/24/15 1945   10/24/15 1600  piperacillin-tazobactam (ZOSYN) IVPB 3.375 g     3.375 g 12.5 mL/hr over 240 Minutes Intravenous  Once 10/24/15 1547 10/24/15 1801   10/24/15 1600  vancomycin (VANCOCIN) IVPB 1000 mg/200 mL premix     1,000 mg 200 mL/hr over 60 Minutes Intravenous  Once 10/24/15 1547 10/24/15 1801     Code  Status:     Code Status Orders        Start     Ordered   10/24/15 1650  Full code   Continuous     10/24/15 1649     Family Communication:   Disposition Plan: cont antibiotics DVT prophylaxis: Lovenox Consultants: surgery Procedures:     Objective: Filed Weights   10/24/15 1022  Weight: 58.968 kg (130 lb)    Intake/Output Summary (Last 24 hours) at 10/25/15 1151 Last data filed at 10/25/15 9450  Gross per 24 hour  Intake   1080 ml  Output      0 ml  Net   1080 ml     Vitals Filed Vitals:   10/24/15 1803 10/24/15 2035 10/25/15 0056 10/25/15 0635  BP: 133/81 127/76  127/82  Pulse: 90 98  102  Temp: 99.7 F (37.6 C) 100.4 F (38 C) 99.6 F (37.6 C) 100.3 F (37.9 C)  TempSrc: Oral Oral Oral Oral  Resp: 16 18  18   Height:      Weight:      SpO2: 96% 98%  99%    Exam:  General:  Pt is alert, not in acute distress  HEENT: No icterus, No thrush, oral mucosa moist  Cardiovascular: regular rate and rhythm, S1/S2 No murmur  Respiratory: clear to auscultation bilaterally   Abdomen: Soft, +Bowel sounds, non tender, non  distended, no guarding  MSK: No cyanosis or clubbing- + swelling, tenderness and fluctuance in right plantar aspect/forefoot- multiple amputated toes bilaterally    Data Reviewed: Basic Metabolic Panel:  Recent Labs Lab 10/24/15 1153 10/25/15 0515  NA 141 139  K 3.7 3.5  CL 108 106  CO2 25 24  GLUCOSE 115* 92  BUN 6 <5*  CREATININE 0.66 0.62  CALCIUM 8.4* 7.9*   Liver Function Tests:  Recent Labs Lab 10/24/15 1153 10/25/15 0515  AST 64* 47*  ALT 40 29  ALKPHOS 134* 106  BILITOT 0.3 0.5  PROT 8.0 7.0  ALBUMIN 2.7* 2.2*   No results for input(s): LIPASE, AMYLASE in the last 168 hours. No results for input(s): AMMONIA in the last 168 hours. CBC:  Recent Labs Lab 10/24/15 1153 10/24/15 2054 10/25/15 0515  WBC 9.2 9.4 10.9*  NEUTROABS 7.0  --   --   HGB 10.6* 9.7* 9.9*  HCT 33.8* 32.5* 31.7*  MCV 89.2 90.8 90.1   PLT 386 348 392   Cardiac Enzymes: No results for input(s): CKTOTAL, CKMB, CKMBINDEX, TROPONINI in the last 168 hours. BNP (last 3 results) No results for input(s): BNP in the last 8760 hours.  ProBNP (last 3 results) No results for input(s): PROBNP in the last 8760 hours.  CBG:  Recent Labs Lab 10/24/15 2130 10/25/15 0631  GLUCAP 132* 86    No results found for this or any previous visit (from the past 240 hour(s)).   Studies: Dg Thoracolumabar Spine  10/24/2015  CLINICAL DATA:  Thoracic and lumbar spine pain following fall. Initial encounter. EXAM: THORACOLUMBAR SPINE 1V COMPARISON:  10/07/2015 FINDINGS: Normal alignment noted. There is no evidence of acute fracture or subluxation. The disc spaces are maintained. Extensive soft tissue calcifications are again noted. IMPRESSION: No evidence of acute abnormality. Electronically Signed   By: Harmon Pier M.D.   On: 10/24/2015 18:09   Mr Foot Right W Wo Contrast  10/24/2015  CLINICAL DATA:  Worsening pain and swelling in the foot. Prior right great toe amputation with debridement 7 days ago. EXAM: MRI OF THE RIGHT FOREFOOT WITHOUT AND WITH CONTRAST TECHNIQUE: Multiplanar, multisequence MR imaging was performed both before and after administration of intravenous contrast. The patient was only able to tolerate 1 post-contrast plain before terminating the exam due to discomfort. CONTRAST:  13 cc MultiHance COMPARISON:  04/26/2015 FINDINGS: Osteomyelitis protocol MRI of the foot was obtained, to include the entire foot and ankle. This protocol uses a large field of view to cover the entire foot and ankle, and is suitable for assessing bony structures for osteomyelitis. Due to the large field of view and imaging plane choice, this protocol is less sensitive for assessing small structures such as ligamentous structures of the foot and ankle, compared to a dedicated forefoot or dedicated hindfoot exam. There is an unusual appearance of the  amputation site. Much of the first metatarsal has been resected but there is a thin lateral rim of metatarsal cortex extending distally to the expected vicinity of the metatarsal head. The first digit sesamoids are visible on image 49 series 9, but without significant adjacent support structure and with apparent discontinuity of the flexor hallucis longus just proximal to the sesamoids between images 47 and 48 of series 9. Overlying wound noted at the metatarsal resection site dorsum medially. There is subcutaneous edema and enhancement tracking along the plantar foot, plantar musculature of the foot, and into the toes, suspicious for cellulitis. Additionally, along the plantar subcutaneous  tissues of the foot there is extensive high T2 signal with hypoenhancement favoring inflammatory phlegmon or potentially early necrotic tissue. No gas tracks in this vicinity. There is a lesser degree of rind like marginal enhancement and more internal high T1 reticulation within mass region than I would expect for overt abscess. Correlate with any fluctuance along the plantar midfoot and metatarsals. I do not identify any overt osteomyelitis of the remaining bony structures. : IMPRESSION: 1. Unusual resection of the first metatarsal appears to leave the metatarsal base and the lateral cortex of the shaft extending to the expected location of the first metatarsal head, but the rest of the bone is absent. First digit sesamoids are still present. No active osteomyelitis currently seen. There is an overlying dorsomedial wound along the metatarsal resection site. Consider conventional radiography of the foot to further correlate with the expected post amputation appearance. 2. Cellulitis in the foot, with suspected extensive phlegmon and possibly some early necrotic tissue along the plantar foot, correlate with any fluctuance in this vicinity in assessing for subcutaneous abscesses. 3. Low-level enhancement along the plantar  musculature of the foot may reflect myositis. Electronically Signed   By: Gaylyn Rong M.D.   On: 10/24/2015 14:16    Scheduled Meds:  Scheduled Meds: . amLODipine  10 mg Oral Daily  . enoxaparin (LOVENOX) injection  40 mg Subcutaneous Q24H  . escitalopram  10 mg Oral Daily  . pantoprazole  40 mg Oral Daily  . predniSONE  10 mg Oral Q breakfast  . vancomycin  1,500 mg Intravenous Q24H   Continuous Infusions: . sodium chloride 75 mL (10/24/15 1945)    Time spent on care of this patient: 35 min   Allison Zeimet, MD 10/25/2015, 11:51 AM  LOS: 1 day   [facility] Office  [phone] Pager - Text Page per [email or web address] If 7PM-7AM, please contact night-coverage [email or web address]

## 2015-10-25 NOTE — Progress Notes (Signed)
Brief History and Physical  History of Present Illness  Allison Beasley is a 55 y.o. female who presents with chief complaint: R foot pain.  This patient is well known to Dr. 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  Reported this patient has had severe pain with swelling of the right foot over the last week.  She notes the foot has decreased in size but continues to be quite painful.  She denies any rest pain or intermittent claudication.    Past Medical History  Diagnosis Date  . Osteoarthritis   . Osteopenia   . Raynaud phenomenon   . Constipation   . Shortness of breath   . GERD (gastroesophageal reflux disease)   . Headache(784.0)   . Anemia   . Hypertension   . Seizures (HCC)     with chid birth 50f 2 childre  . Peripheral vascular disease (HCC)   . Depression   . Anxiety   . Type II diabetes mellitus (HCC)   . Polymyositis (HCC)   . Fibromyalgia   . Pneumonia 03/2012  . Polymyositis [facility])     Past Surgical History  Procedure Laterality Date  . Cesarean section      x3  . Tubal ligation    . Hysteroscopy      w/ D&C with excision of endometrial polyps  . Left great toenail removal  Left 2013    done by [place] [facility]  . Tonsillectomy Bilateral   . Amputation Left 12/24/2012    Procedure: AMPUTATION DIGIT left great toe;  Surgeon: Sherren Kerns, MD;  Location: [facility] OR;  Service: Vascular;  Laterality: Left;  GREAT  . Abdominal aortagram N/A 07/25/2014    Procedure: ABDOMINAL Ronny Flurry;  Surgeon: Sherren Kerns, MD;  Location: [facility] CATH LAB;  Service: Cardiovascular;  Laterality: N/A;  . Peripheral vascular catheterization N/A 05/15/2015    Procedure: Abdominal Aortogram;  Surgeon: Sherren Kerns, MD;  Location: [facility] INVASIVE CV LAB;  Service: Cardiovascular;  Laterality: N/A;  . Colonoscopy    . Amputation Right 05/19/2015    Procedure: AMPUTATION DIGIT;  Surgeon: Sherren Kerns, MD;   Location: [facility] OR;  Service: Vascular;  Laterality: Right;  . Amputation Right 07/22/2015    Procedure: RAY AMPUTATION OF RIGHT GREAT TOE AMPUTATION SITE;  Surgeon: Sherren Kerns, MD;  Location: [facility] OR;  Service: Vascular;  Laterality: Right;    Social History   Social History  . Marital Status: Married    Spouse Name: N/A  . Number of Children: N/A  . Years of Education: N/A   Occupational History  . Not on file.   Social History Main Topics  . Smoking status: Never Smoker   . Smokeless tobacco: Never Used  . Alcohol Use: 0.0 oz/week    0 Standard drinks or equivalent per week     Comment: 05/19/2015 "might have a couple drinks/yr"  . Drug Use: No  . Sexual Activity: Not on file   Other Topics Concern  . Not on file   Social History Narrative    Family History  Problem Relation Age of Onset  . Diabetes Mother   . Hypertension Mother   . Diabetes Father   . Heart disease Father   . Hypertension Sister   . Heart disease Sister     No current facility-administered medications on file prior to encounter.   Current Outpatient Prescriptions on File  Prior to Encounter  Medication Sig Dispense Refill  . amLODipine (NORVASC) 10 MG tablet Take 10 mg by mouth daily.    . cephALEXin (KEFLEX) 500 MG capsule Take 1 capsule (500 mg total) by mouth 3 (three) times daily. 30 capsule 0  . diphenhydrAMINE (BENADRYL) 25 MG tablet Take 25 mg by mouth 2 (two) times daily as needed for itching.    . escitalopram (LEXAPRO) 10 MG tablet Take 10 mg by mouth daily.    Marland Kitchen omeprazole (PRILOSEC) 40 MG capsule Take 40 mg by mouth daily.     Marland Kitchen oxyCODONE-acetaminophen (PERCOCET/ROXICET) 5-325 MG tablet Take 1 tablet by mouth every 8 (eight) hours as needed for moderate pain. pain 30 tablet 0  . predniSONE (DELTASONE) 10 MG tablet Take 10 mg by mouth daily with breakfast.    . ALPRAZolam (XANAX) 0.25 MG tablet Take 1 tablet (0.25 mg total) by mouth 3 (three) times daily as needed for anxiety. 20  tablet 0  . Chlorpheniramine-Phenylephrine (SINUS/ALLERGY PE PO) Take 1 tablet by mouth 2 (two) times daily as needed (sinus drainage).    Marland Kitchen levofloxacin (LEVAQUIN) 500 MG tablet Take 1 tablet (500 mg total) by mouth daily. (Patient not taking: Reported on 10/01/2015) 10 tablet 0    Allergies  Allergen Reactions  . Clindamycin/Lincomycin Itching and Rash    Possible reaction (took clindamycin and cipro at the same time)  . Doxycycline Nausea Only  . Percocet [Oxycodone-Acetaminophen] Itching  . Tape Itching and Other (See Comments)    Plastic tape causes itching-USE PAPER TAPE ONLY  . Mycophenolate Diarrhea  . Ciprofloxacin Itching and Rash    Possible reaction (took clindamycin and cipro at the same time)     REVIEW OF SYSTEMS:  (Positives checked otherwise negative)  CARDIOVASCULAR:   [ ]  chest pain,  [ ]  chest pressure,  [ ]  palpitations,  [ ]  shortness of breath when laying flat,  [x]  shortness of breath with exertion,   [ ]  pain in feet when walking,  [ ]  pain in feet when laying flat, [ ]  history of blood clot in veins (DVT),  [ ]  history of phlebitis,  [ ]  swelling in legs,  [ ]  varicose veins  PULMONARY:   [ ]  productive cough,  [x]  asthma,  [ ]  wheezing  NEUROLOGIC:   [ ]  weakness in arms or legs,  [ ]  numbness in arms or legs,  [ ]  difficulty speaking or slurred speech,  [ ]  temporary loss of vision in one eye,  [ ]  dizziness  HEMATOLOGIC:   [ ]  bleeding problems,  [ ]  problems with blood clotting too easily  MUSCULOSKEL:   [ ]  joint pain, [ ]  joint swelling [x]  painful right plantar surface  GASTROINTEST:   [ ]  vomiting blood,  [ ]  blood in stool     GENITOURINARY:   [ ]  burning with urination,  [ ]  blood in urine  PSYCHIATRIC:   [ ]  history of major depression  INTEGUMENTARY:   [ ]  rashes,  [ ]  ulcers  CONSTITUTIONAL:   [ ]  fever,  [ ]  chills   Physical Examination  Filed Vitals:   10/24/15 2035 10/25/15 0056 10/25/15 0635  10/25/15 1346  BP: 127/76  127/82 124/76  Pulse: 98  102   Temp: 100.4 F (38 C) 99.6 F (37.6 C) 100.3 F (37.9 C) 99.5 F (37.5 C)  TempSrc: Oral Oral Oral Oral  Resp: 18  18 19   Height:  Weight:      SpO2: 98%  99% 100%    General: A&O x 3, WDWN  Pulmonary: Sym exp, good air movt, CTAB, no rales, rhonchi, & wheezing  Cardiac: RRR, Nl S1, S2, no Murmurs, rubs or gallops  Gastrointestinal: soft, NTND, -G/R, - HSM, - masses, - CVAT B  Musculoskeletal: M/S 5/5 throughout except right foot ROM test limited by pain.  Healed bilateral toe amputations.  Prior R dorsal 3rd MT abscess resolved.  Broad ballotable fluid collection in plantar surface overlying the 2nd and 3rd MT with erythema present, no ascending cellulits  Vascular: faintly palpable pedal pulses  Laboratory: CBC:    Component Value Date/Time   WBC 10.9* 10/25/2015 0515   RBC 3.52* 10/25/2015 0515   HGB 9.9* 10/25/2015 0515   HCT 31.7* 10/25/2015 0515   PLT 392 10/25/2015 0515   MCV 90.1 10/25/2015 0515   MCH 28.1 10/25/2015 0515   MCHC 31.2 10/25/2015 0515   RDW 18.9* 10/25/2015 0515   LYMPHSABS 1.7 10/24/2015 1153   MONOABS 0.3 10/24/2015 1153   EOSABS 0.2 10/24/2015 1153   BASOSABS 0.0 10/24/2015 1153    BMP:    Component Value Date/Time   NA 139 10/25/2015 0515   K 3.5 10/25/2015 0515   CL 106 10/25/2015 0515   CO2 24 10/25/2015 0515   GLUCOSE 92 10/25/2015 0515   BUN <5* 10/25/2015 0515   CREATININE 0.62 10/25/2015 0515   CALCIUM 7.9* 10/25/2015 0515   GFRNONAA >60 10/25/2015 0515   GFRAA >60 10/25/2015 0515    Coagulation: Lab Results  Component Value Date   INR 1.11 07/21/2015   INR 1.02 01/13/2013   No results found for: PTT  Lipids: No results found for: CHOL, TRIG, HDL, CHOLHDL, VLDL, LDLCALC, LDLDIRECT   Radiology: Dg Thoracolumabar Spine  10/24/2015  CLINICAL DATA:  Thoracic and lumbar spine pain following fall. Initial encounter. EXAM: THORACOLUMBAR SPINE 1V  COMPARISON:  10/07/2015 FINDINGS: Normal alignment noted. There is no evidence of acute fracture or subluxation. The disc spaces are maintained. Extensive soft tissue calcifications are again noted. IMPRESSION: No evidence of acute abnormality. Electronically Signed   By: Harmon Pier M.D.   On: 10/24/2015 18:09   Mr Foot Right W Wo Contrast  10/24/2015  CLINICAL DATA:  Worsening pain and swelling in the foot. Prior right great toe amputation with debridement 7 days ago. EXAM: MRI OF THE RIGHT FOREFOOT WITHOUT AND WITH CONTRAST TECHNIQUE: Multiplanar, multisequence MR imaging was performed both before and after administration of intravenous contrast. The patient was only able to tolerate 1 post-contrast plain before terminating the exam due to discomfort. CONTRAST:  13 cc MultiHance COMPARISON:  04/26/2015 FINDINGS: Osteomyelitis protocol MRI of the foot was obtained, to include the entire foot and ankle. This protocol uses a large field of view to cover the entire foot and ankle, and is suitable for assessing bony structures for osteomyelitis. Due to the large field of view and imaging plane choice, this protocol is less sensitive for assessing small structures such as ligamentous structures of the foot and ankle, compared to a dedicated forefoot or dedicated hindfoot exam. There is an unusual appearance of the amputation site. Much of the first metatarsal has been resected but there is a thin lateral rim of metatarsal cortex extending distally to the expected vicinity of the metatarsal head. The first digit sesamoids are visible on image 49 series 9, but without significant adjacent support structure and with apparent discontinuity of the flexor hallucis longus  just proximal to the sesamoids between images 47 and 48 of series 9. Overlying wound noted at the metatarsal resection site dorsum medially. There is subcutaneous edema and enhancement tracking along the plantar foot, plantar musculature of the foot, and  into the toes, suspicious for cellulitis. Additionally, along the plantar subcutaneous tissues of the foot there is extensive high T2 signal with hypoenhancement favoring inflammatory phlegmon or potentially early necrotic tissue. No gas tracks in this vicinity. There is a lesser degree of rind like marginal enhancement and more internal high T1 reticulation within mass region than I would expect for overt abscess. Correlate with any fluctuance along the plantar midfoot and metatarsals. I do not identify any overt osteomyelitis of the remaining bony structures. : IMPRESSION: 1. Unusual resection of the first metatarsal appears to leave the metatarsal base and the lateral cortex of the shaft extending to the expected location of the first metatarsal head, but the rest of the bone is absent. First digit sesamoids are still present. No active osteomyelitis currently seen. There is an overlying dorsomedial wound along the metatarsal resection site. Consider conventional radiography of the foot to further correlate with the expected post amputation appearance. 2. Cellulitis in the foot, with suspected extensive phlegmon and possibly some early necrotic tissue along the plantar foot, correlate with any fluctuance in this vicinity in assessing for subcutaneous abscesses. 3. Low-level enhancement along the plantar musculature of the foot may reflect myositis. Electronically Signed   By: Gaylyn Rong M.D.   On: 10/24/2015 14:16    Medical Decision Making  MAELYN BERREY is a 55 y.o. female who presents with: s/p B 1st ray amputation, R plantar surface abscess   The patient already ate today, so will plan on I&D of R foot tomorrow.  The patient is aware the risks included but are not limited to: bleeding, infection, nerve damage, need for additional procedures including repeat surgical drainage, and possible limb loss from infection.   The patient is aware of the risks and agrees to proceed.  Allison Sake,  MD [facility] Office: [phone] Pager: [phone]  10/25/2015, 3:11 PM

## 2015-10-26 ENCOUNTER — Encounter (HOSPITAL_COMMUNITY)
Admission: EM | Disposition: A | Discharge status: Home / Self Care | Payer: BLUE CROSS/BLUE SHIELD | Source: Home / Self Care | Attending: Internal Medicine

## 2015-10-26 ENCOUNTER — Inpatient Hospital Stay (HOSPITAL_COMMUNITY): Payer: BLUE CROSS/BLUE SHIELD | Admitting: Anesthesiology

## 2015-10-26 ENCOUNTER — Encounter (HOSPITAL_COMMUNITY): Payer: Self-pay | Admitting: Certified Registered Nurse Anesthetist

## 2015-10-26 DIAGNOSIS — L02611 Cutaneous abscess of right foot: Secondary | ICD-10-CM

## 2015-10-26 HISTORY — PX: IRRIGATION AND DEBRIDEMENT FOOT: SHX6602

## 2015-10-26 HISTORY — PX: I & D EXTREMITY: SHX5045

## 2015-10-26 LAB — GLUCOSE, CAPILLARY
GLUCOSE-CAPILLARY: 85 mg/dL (ref 65–99)
GLUCOSE-CAPILLARY: 91 mg/dL (ref 65–99)
GLUCOSE-CAPILLARY: 133 mg/dL — AB (ref 65–99)
GLUCOSE-CAPILLARY: 144 mg/dL — AB (ref 65–99)
Glucose-Capillary: 123 mg/dL — ABNORMAL HIGH (ref 65–99)

## 2015-10-26 LAB — BASIC METABOLIC PANEL
Anion gap: 9 (ref 5–15)
BUN: 8 mg/dL (ref 6–20)
CALCIUM: 8.3 mg/dL — AB (ref 8.9–10.3)
CO2: 24 mmol/L (ref 22–32)
CREATININE: 0.71 mg/dL (ref 0.44–1.00)
Chloride: 109 mmol/L (ref 101–111)
GFR calc Af Amer: >60 mL/min (ref >60)
Glucose, Bld: 132 mg/dL — ABNORMAL HIGH (ref 65–99)
Potassium: 4.1 mmol/L (ref 3.5–5.1)
SODIUM: 142 mmol/L (ref 135–145)

## 2015-10-26 LAB — CBC
HCT: 32 % — ABNORMAL LOW (ref 36.0–46.0)
Hemoglobin: 9.6 g/dL — ABNORMAL LOW (ref 12.0–15.0)
MCH: 27 pg (ref 26.0–34.0)
MCHC: 30 g/dL (ref 30.0–36.0)
MCV: 90.1 fL (ref 78.0–100.0)
PLATELETS: 413 10*3/uL — AB (ref 150–400)
RBC: 3.55 MIL/uL — ABNORMAL LOW (ref 3.87–5.11)
RDW: 18.8 % — AB (ref 11.5–15.5)
WBC: 8.1 10*3/uL (ref 4.0–10.5)

## 2015-10-26 SURGERY — IRRIGATION AND DEBRIDEMENT EXTREMITY
Anesthesia: General | Site: Foot | Laterality: Right

## 2015-10-26 MED ORDER — PIPERACILLIN-TAZOBACTAM 3.375 G IVPB 30 MIN
3.3750 g | Freq: Once | INTRAVENOUS | Status: AC
  Administered 2015-10-26: 3.375 g via INTRAVENOUS
  Filled 2015-10-26 (×2): qty 50

## 2015-10-26 MED ORDER — MIDAZOLAM HCL 2 MG/2ML IJ SOLN
INTRAMUSCULAR | Status: AC
  Filled 2015-10-26: qty 2

## 2015-10-26 MED ORDER — MORPHINE SULFATE (PF) 4 MG/ML IV SOLN
INTRAVENOUS | Status: AC
  Filled 2015-10-26: qty 1

## 2015-10-26 MED ORDER — LACTATED RINGERS IV SOLN
INTRAVENOUS | Status: DC | PRN
  Administered 2015-10-26: 08:00:00 via INTRAVENOUS

## 2015-10-26 MED ORDER — DEXAMETHASONE SODIUM PHOSPHATE 4 MG/ML IJ SOLN
INTRAMUSCULAR | Status: AC
Start: 2015-10-26 — End: 2015-10-26
  Filled 2015-10-26: qty 1

## 2015-10-26 MED ORDER — ONDANSETRON HCL 4 MG/2ML IJ SOLN
INTRAMUSCULAR | Status: AC
  Filled 2015-10-26: qty 2

## 2015-10-26 MED ORDER — DEXAMETHASONE SODIUM PHOSPHATE 4 MG/ML IJ SOLN
INTRAMUSCULAR | Status: DC | PRN
  Administered 2015-10-26: 4 mg via INTRAVENOUS

## 2015-10-26 MED ORDER — PROPOFOL 10 MG/ML IV BOLUS
INTRAVENOUS | Status: AC
  Filled 2015-10-26: qty 20

## 2015-10-26 MED ORDER — LIDOCAINE HCL (CARDIAC) 20 MG/ML IV SOLN
INTRAVENOUS | Status: DC | PRN
  Administered 2015-10-26: 100 mg via INTRAVENOUS

## 2015-10-26 MED ORDER — PROPOFOL 10 MG/ML IV BOLUS
INTRAVENOUS | Status: DC | PRN
  Administered 2015-10-26: 200 mg via INTRAVENOUS

## 2015-10-26 MED ORDER — FENTANYL CITRATE (PF) 100 MCG/2ML IJ SOLN
INTRAMUSCULAR | Status: DC | PRN
  Administered 2015-10-26 (×2): 50 ug via INTRAVENOUS
  Administered 2015-10-26 (×2): 25 ug via INTRAVENOUS
  Administered 2015-10-26 (×2): 50 ug via INTRAVENOUS

## 2015-10-26 MED ORDER — PHENYLEPHRINE 40 MCG/ML (10ML) SYRINGE FOR IV PUSH (FOR BLOOD PRESSURE SUPPORT)
PREFILLED_SYRINGE | INTRAVENOUS | Status: AC
  Filled 2015-10-26: qty 10

## 2015-10-26 MED ORDER — MORPHINE SULFATE (PF) 2 MG/ML IV SOLN
INTRAVENOUS | Status: AC
  Filled 2015-10-26: qty 1

## 2015-10-26 MED ORDER — HYDROCODONE-ACETAMINOPHEN 7.5-325 MG PO TABS: 2.0000 | ORAL_TABLET | Freq: Once | ORAL | Status: DC | PRN

## 2015-10-26 MED ORDER — PHENYLEPHRINE HCL 10 MG/ML IJ SOLN
INTRAMUSCULAR | Status: DC | PRN
  Administered 2015-10-26 (×2): 80 ug via INTRAVENOUS
  Administered 2015-10-26 (×3): 120 ug via INTRAVENOUS
  Administered 2015-10-26: 80 ug via INTRAVENOUS

## 2015-10-26 MED ORDER — PROMETHAZINE HCL 25 MG/ML IJ SOLN: 6.2500 mg | INTRAMUSCULAR | Status: DC | PRN

## 2015-10-26 MED ORDER — FENTANYL CITRATE (PF) 250 MCG/5ML IJ SOLN
INTRAMUSCULAR | Status: AC
  Filled 2015-10-26: qty 5

## 2015-10-26 MED ORDER — GLYCOPYRROLATE 0.2 MG/ML IJ SOLN
INTRAMUSCULAR | Status: AC
  Filled 2015-10-26: qty 1

## 2015-10-26 MED ORDER — HYDROCODONE-ACETAMINOPHEN 5-325 MG PO TABS
ORAL_TABLET | ORAL | Status: AC
  Filled 2015-10-26: qty 2

## 2015-10-26 MED ORDER — PIPERACILLIN-TAZOBACTAM 3.375 G IVPB
3.3750 g | Freq: Three times a day (TID) | INTRAVENOUS | Status: DC
  Administered 2015-10-26 – 2015-10-28 (×5): 3.375 g via INTRAVENOUS
  Filled 2015-10-26 (×7): qty 50

## 2015-10-26 MED ORDER — MORPHINE SULFATE (PF) 2 MG/ML IV SOLN
2.0000 mg | INTRAVENOUS | Status: DC | PRN
  Administered 2015-10-26 (×6): 2 mg via INTRAVENOUS

## 2015-10-26 MED ORDER — LIDOCAINE HCL (CARDIAC) 20 MG/ML IV SOLN
INTRAVENOUS | Status: AC
  Filled 2015-10-26: qty 5

## 2015-10-26 MED ORDER — GLYCOPYRROLATE 0.2 MG/ML IJ SOLN
INTRAMUSCULAR | Status: DC | PRN
  Administered 2015-10-26: 0.1 mg via INTRAVENOUS

## 2015-10-26 MED ORDER — 0.9 % SODIUM CHLORIDE (POUR BTL) OPTIME
TOPICAL | Status: DC | PRN
  Administered 2015-10-26: 1000 mL

## 2015-10-26 SURGICAL SUPPLY — 35 items
BANDAGE ELASTIC 4 VELCRO ST LF (GAUZE/BANDAGES/DRESSINGS) IMPLANT
BANDAGE ELASTIC 6 VELCRO ST LF (GAUZE/BANDAGES/DRESSINGS) IMPLANT
BNDG GAUZE ELAST 4 BULKY (GAUZE/BANDAGES/DRESSINGS) IMPLANT
CANISTER SUCTION 2500CC (MISCELLANEOUS) ×2 IMPLANT
COVER SURGICAL LIGHT HANDLE (MISCELLANEOUS) ×3 IMPLANT
DRAPE INCISE IOBAN 66X45 STRL (DRAPES) IMPLANT
DRAPE ORTHO SPLIT 77X108 STRL (DRAPES) ×2
DRAPE PROXIMA HALF (DRAPES) ×2 IMPLANT
DRAPE SURG ORHT 6 SPLT 77X108 (DRAPES) IMPLANT
DRSG KUZMA FLUFF (GAUZE/BANDAGES/DRESSINGS) ×1 IMPLANT
DRSG PAD ABDOMINAL 8X10 ST (GAUZE/BANDAGES/DRESSINGS) ×1 IMPLANT
ELECT LOOP CUT MONO 26F .012 R (MISCELLANEOUS) ×1 IMPLANT
ELECT REM PT RETURN 9FT ADLT (ELECTROSURGICAL) ×2
ELECTRODE REM PT RTRN 9FT ADLT (ELECTROSURGICAL) ×1 IMPLANT
GAUZE SPONGE 4X4 12PLY STRL (GAUZE/BANDAGES/DRESSINGS) ×2 IMPLANT
GLOVE BIO SURGEON STRL SZ7 (GLOVE) ×3 IMPLANT
GLOVE BIO SURGEON STRL SZ7.5 (GLOVE) ×1 IMPLANT
GLOVE BIOGEL PI IND STRL 7.0 (GLOVE) IMPLANT
GLOVE BIOGEL PI IND STRL 7.5 (GLOVE) ×1 IMPLANT
GLOVE BIOGEL PI INDICATOR 7.0 (GLOVE) ×1
GLOVE BIOGEL PI INDICATOR 7.5 (GLOVE) ×3
GOWN STRL REUS W/ TWL LRG LVL3 (GOWN DISPOSABLE) ×3 IMPLANT
GOWN STRL REUS W/TWL LRG LVL3 (GOWN DISPOSABLE) ×6
KIT BASIN OR (CUSTOM PROCEDURE TRAY) ×2 IMPLANT
KIT ROOM TURNOVER OR (KITS) ×2 IMPLANT
NS IRRIG 1000ML POUR BTL (IV SOLUTION) ×2 IMPLANT
PACK GENERAL/GYN (CUSTOM PROCEDURE TRAY) ×1 IMPLANT
PAD ARMBOARD 7.5X6 YLW CONV (MISCELLANEOUS) ×4 IMPLANT
SPONGE GAUZE 4X4 12PLY STER LF (GAUZE/BANDAGES/DRESSINGS) ×1 IMPLANT
SUT ETHILON 3 0 PS 1 (SUTURE) IMPLANT
SUT MNCRL AB 4-0 PS2 18 (SUTURE) IMPLANT
SUT VIC AB 2-0 CTX 36 (SUTURE) IMPLANT
SUT VIC AB 3-0 SH 27 (SUTURE)
SUT VIC AB 3-0 SH 27X BRD (SUTURE) IMPLANT
WATER STERILE IRR 1000ML POUR (IV SOLUTION) ×2 IMPLANT

## 2015-10-26 NOTE — Transfer of Care (Signed)
Immediate Anesthesia Transfer of Care Note  Patient: Allison Beasley  Procedure(s) Performed: Procedure(s): IRRIGATION AND DEBRIDEMENT EXTREMITY/RIGHT FOOT (Right)  Patient Location: PACU  Anesthesia Type:General  Level of Consciousness: awake, patient cooperative and responds to stimulation  Airway & Oxygen Therapy: Patient Spontanous Breathing and Patient connected to nasal cannula oxygen  Post-op Assessment: Report given to RN and Post -op Vital signs reviewed and stable  Post vital signs: Reviewed and stable  Last Vitals:  Filed Vitals:   10/26/15 0625 10/26/15 0848  BP: 143/86 140/94  Pulse: 103 109  Temp: 37.1 C 36.6 C  Resp: 18 13    Complications: No apparent anesthesia complications

## 2015-10-26 NOTE — Anesthesia Preprocedure Evaluation (Signed)
Anesthesia Evaluation  Patient identified by MRN, date of birth, ID band Patient awake    Reviewed: Allergy & Precautions, NPO status , Patient's Chart, lab work & pertinent test results  Airway Mallampati: III  TM Distance: >3 FB Neck ROM: Full  Mouth opening: Limited Mouth Opening  Dental   Pulmonary neg pulmonary ROS,    breath sounds clear to auscultation       Cardiovascular hypertension, Pt. on medications + Peripheral Vascular Disease   Rhythm:Regular Rate:Normal  2014 echo with normal EF 60%, no sig valve abnormalities   Neuro/Psych Anxiety Depression  Neuromuscular disease    GI/Hepatic Neg liver ROS, GERD  ,  Endo/Other  diabetes, Type 2  Renal/GU negative Renal ROS     Musculoskeletal  (+) Arthritis , Osteoarthritis,  Fibromyalgia -  Abdominal   Peds  Hematology  (+) anemia ,   Anesthesia Other Findings Right foot abscess here for I and D  Reproductive/Obstetrics                             Anesthesia Physical  Anesthesia Plan  ASA: III  Anesthesia Plan: General   Post-op Pain Management:    Induction: Intravenous  Airway Management Planned: LMA  Additional Equipment: None  Intra-op Plan:   Post-operative Plan: Extubation in OR  Informed Consent: I have reviewed the patients History and Physical, chart, labs and discussed the procedure including the risks, benefits and alternatives for the proposed anesthesia with the patient or authorized representative who has indicated his/her understanding and acceptance.   Dental advisory given  Plan Discussed with: CRNA and Anesthesiologist  Anesthesia Plan Comments:         Anesthesia Quick Evaluation

## 2015-10-26 NOTE — Op Note (Addendum)
    OPERATIVE NOTE   PROCEDURE: 1. Incision and drainage of right foot abscess  PRE-OPERATIVE DIAGNOSIS: right foot abscess  POST-OPERATIVE DIAGNOSIS: same  SURGEON: Leonides Sake, MD  ANESTHESIA: general  ESTIMATED BLOOD LOSS: 50 cc  FINDING(S): 1.  50 cc of frank pus with dead skin overlying abscess cavity 2.  No frank tendon exposed  SPECIMEN(S):  Anaerobic and aerobic cultures of abscess  INDICATIONS:   Allison Beasley is a 55 y.o. female who presents with pain in right foot with history multiple episodes of osteomyelitis.  She came to the hospital and had a MRI done which demonstrated an abscess in the dorsal surface of the right foot.  I recommended incision and drainage of the right foot abscess as an attempt to salvage her right foot.  The risk of this procedure were discussed with the patient.  The patient is aware the risks include but are not limited to: bleeding, infection, myocardial infarction, stroke, limb loss, nerve damage, need for additional procedures in the future, and wound complications.  The patient is aware of these risks and agreed to proceed.   DESCRIPTION: After obtaining full informed written consent, the patient was brought back to the operating room and placed supine upon the operating table.  The patient received IV antibiotics prior to induction.  After obtaining adequate anesthesia, the patient was prepped and draped in the standard fashion for: right foot abscess drainage.  I made an incision overlying the abscess and immediately got frank pus to drain.  In total, I drained ~50 cc of pus from the plantar surface of the foot.  In exploring the abscess cavity, it became apparent that it was larger than expected, 5 cm x 3 cm x 2 cm.  I bluntly lysed any septae in the abscess cavity.  The skin overlying this cavity was dead, so I resected the dead skin to facilitate the wound care of this abscess cavity.  There was some necrotic tissue at the base of the abscess  cavity.  I irrigated out this cavity with 1 L of sterile normal saline with a Pulsavac.  This appeared to have debrided much of the necrotic tissue.  I did not see any frank tendon in the wound at this point.  I controlled a few bleeding point with electrocautery and then packed a saline soaked Kerlix into this cavity and then wrapped the foot with an ABD pad.  I secured this ABD in place with a dry Kerlix.     COMPLICATIONS: none  CONDITION: stable   Leonides Sake, MD [facility] Office: [phone] Pager: [phone]  10/26/2015, 8:39 AM

## 2015-10-26 NOTE — Interval H&P Note (Signed)
History and Physical Interval Note:  10/26/2015 7:34 AM  Allison Beasley  has presented today for surgery, with the diagnosis of right Foot Abscess  The various methods of treatment have been discussed with the patient and family. After consideration of risks, benefits and other options for treatment, the patient has consented to  Procedure(s): IRRIGATION AND DEBRIDEMENT EXTREMITY/RIGHT FOOT (Right) as a surgical intervention .  The patient's history has been reviewed, patient examined, no change in status, stable for surgery.  I have reviewed the patient's chart and labs.  Questions were answered to the patient's satisfaction.     Leonides Sake

## 2015-10-26 NOTE — Anesthesia Postprocedure Evaluation (Signed)
Anesthesia Post Note  Patient: EBONYE READE  Procedure(s) Performed: Procedure(s) (LRB): IRRIGATION AND DEBRIDEMENT EXTREMITY/RIGHT FOOT (Right)  Patient location during evaluation: PACU Anesthesia Type: General Level of consciousness: awake and alert Pain management: pain level controlled Vital Signs Assessment: post-procedure vital signs reviewed and stable Respiratory status: spontaneous breathing, nonlabored ventilation, respiratory function stable and patient connected to nasal cannula oxygen Cardiovascular status: blood pressure returned to baseline and stable Postop Assessment: no signs of nausea or vomiting Anesthetic complications: no    Last Vitals:  Filed Vitals:   10/26/15 0900 10/26/15 0908  BP: 148/94   Pulse: 102   Temp:    Resp: 15 11    Last Pain:  Filed Vitals:   10/26/15 0908  PainSc: 9                  Reino Kent

## 2015-10-26 NOTE — Progress Notes (Signed)
[facility] Progress Note   Allison Beasley  DTO:[id number]  DOB: Apr 09, 1961  DOA: 10/24/2015 PCP: No PCP Per Patient  Brief narrative: Allison Beasley is a 55 y.o. female presents with pain and swelling of right foot and is admitted for cellulitis. She was given Augmentin by Dr fields prior to [facility] for this but the swelling worsened when she flew to [facility]. She was hospitalized and discharged on 2 antibiotics- was unable to fill one but continued to take the Augmentin. Swelling worsened once again and she presented the [facility] for it.    Subjective: Significant amount of pain in the foot still. Not able to ambulate. No fevers.   Assessment/Plan: Active Problems:   Cellulitis- plantar aspect right foot - failed treatment with Augmentin- second hospitalization for this - she failed to pick up the second antibiotic she was prescribed on discharge but continued to take the Augmentin which she had already failed. - cont Vanc and Zosyn - MRI negative for osteo - s/p I and D today- follow cultures - blood cx negative  Polymyositis - cont Prednisone  Diet control DM - on ISS- sugars controlled  Lower back pain - after a fall this week-she is able to ambulate  HTN - cont Norvasc  Multiple amputated toes   Antibiotics: Anti-infectives    Start     Dose/Rate Route Frequency Ordered Stop   10/26/15 0000  vancomycin (VANCOCIN) IVPB 1000 mg/200 mL premix  Status:  Discontinued     1,000 mg 200 mL/hr over 60 Minutes Intravenous To Surgery 10/25/15 1521 10/25/15 1530   10/25/15 1600  vancomycin (VANCOCIN) 1,500 mg in sodium chloride 0.9 % 500 mL IVPB     1,500 mg 250 mL/hr over 120 Minutes Intravenous Every 24 hours 10/24/15 1822     10/24/15 1945  vancomycin (VANCOCIN) IVPB 1000 mg/200 mL premix  Status:  Discontinued     1,000 mg 200 mL/hr over 60 Minutes Intravenous  Once 10/24/15 1938 10/24/15 1945   10/24/15 1600  piperacillin-tazobactam (ZOSYN) IVPB 3.375 g     3.375 g 12.5 mL/hr over 240 Minutes Intravenous  Once 10/24/15 1547 10/24/15 1801   10/24/15 1600  vancomycin (VANCOCIN) IVPB 1000 mg/200 mL premix     1,000 mg 200 mL/hr over 60 Minutes Intravenous  Once 10/24/15 1547 10/24/15 1801     Code Status:     Code Status Orders        Start     Ordered   10/24/15 1650  Full code   Continuous     10/24/15 1649     Family Communication:   Disposition Plan: cont antibiotics DVT prophylaxis: Lovenox Consultants: surgery Procedures:     Objective: Filed Weights   10/24/15 1022  Weight: 58.968 kg (130 lb)    Intake/Output Summary (Last 24 hours) at 10/26/15 1222 Last data filed at 10/26/15 1000  Gross per 24 hour  Intake    960 ml  Output      0 ml  Net    960 ml     Vitals Filed Vitals:   10/26/15 0930 10/26/15 0945 10/26/15 1000 10/26/15 1032  BP: 154/93 151/96 155/95 147/90  Pulse: 91 85 99 101  Temp:   98.3 F (36.8 C) 98.3 F (36.8 C)  TempSrc:    Oral  Resp: 11 18 20 18   Height:      Weight:      SpO2: 98% 100% 100% 98%    Exam:  General:  Pt is alert, not in acute distress  HEENT: No icterus, No thrush, oral mucosa moist  Cardiovascular: regular rate and rhythm, S1/S2 No murmur  Respiratory: clear to auscultation bilaterally   Abdomen: Soft, +Bowel sounds, non tender, non distended, no guarding  MSK: No cyanosis or clubbing- + swelling, tenderness and fluctuance in right plantar aspect/forefoot- multiple amputated toes bilaterally    Data Reviewed: Basic Metabolic Panel:  Recent Labs Lab 10/24/15 1153 10/25/15 0515 10/26/15 0352  NA 141 139 142  K 3.7 3.5 4.1  CL 108 106 109  CO2 25 24 24   GLUCOSE 115* 92 132*  BUN 6 <5* 8  CREATININE 0.66 0.62 0.71  CALCIUM 8.4* 7.9* 8.3*   Liver Function Tests:  Recent Labs Lab 10/24/15 1153 10/25/15 0515  AST 64* 47*  ALT 40 29  ALKPHOS 134* 106  BILITOT 0.3 0.5  PROT 8.0 7.0  ALBUMIN 2.7* 2.2*   No results for input(s): LIPASE,  AMYLASE in the last 168 hours. No results for input(s): AMMONIA in the last 168 hours. CBC:  Recent Labs Lab 10/24/15 1153 10/24/15 2054 10/25/15 0515 10/26/15 0352  WBC 9.2 9.4 10.9* 8.1  NEUTROABS 7.0  --   --   --   HGB 10.6* 9.7* 9.9* 9.6*  HCT 33.8* 32.5* 31.7* 32.0*  MCV 89.2 90.8 90.1 90.1  PLT 386 348 392 413*   Cardiac Enzymes: No results for input(s): CKTOTAL, CKMB, CKMBINDEX, TROPONINI in the last 168 hours. BNP (last 3 results) No results for input(s): BNP in the last 8760 hours.  ProBNP (last 3 results) No results for input(s): PROBNP in the last 8760 hours.  CBG:  Recent Labs Lab 10/25/15 1616 10/25/15 2218 10/26/15 0619 10/26/15 0858 10/26/15 1148  GLUCAP 139* 157* 85 91 133*    Recent Results (from the past 240 hour(s))  Blood culture (routine x 2)     Status: None (Preliminary result)   Collection Time: 10/24/15 11:44 AM  Result Value Ref Range Status   Specimen Description BLOOD RIGHT ANTECUBITAL  Final   Special Requests BOTTLES DRAWN AEROBIC AND ANAEROBIC 5CC  Final   Culture NO GROWTH 2 DAYS  Final   Report Status PENDING  Incomplete  Blood culture (routine x 2)     Status: None (Preliminary result)   Collection Time: 10/24/15 11:46 AM  Result Value Ref Range Status   Specimen Description BLOOD RIGHT ANTECUBITAL  Final   Special Requests BOTTLES DRAWN AEROBIC AND ANAEROBIC 5CC  Final   Culture NO GROWTH 2 DAYS  Final   Report Status PENDING  Incomplete  Surgical pcr screen     Status: None   Collection Time: 10/25/15  5:11 PM  Result Value Ref Range Status   MRSA, PCR NEGATIVE NEGATIVE Final   Staphylococcus aureus NEGATIVE NEGATIVE Final    Comment:        The Xpert SA Assay (FDA approved for NASAL specimens in patients over 61 years of age), is one component of a comprehensive surveillance program.  Test performance has been validated by [facility] for patients greater than or equal to 65 year old. It is not intended to  diagnose infection nor to guide or monitor treatment.      Studies: Dg Thoracolumabar Spine  10/24/2015  CLINICAL DATA:  Thoracic and lumbar spine pain following fall. Initial encounter. EXAM: THORACOLUMBAR SPINE 1V COMPARISON:  10/07/2015 FINDINGS: Normal alignment noted. There is no evidence of acute fracture or subluxation. The disc spaces are maintained. Extensive soft  tissue calcifications are again noted. IMPRESSION: No evidence of acute abnormality. Electronically Signed   By: Harmon Pier M.D.   On: 10/24/2015 18:09   Mr Foot Right W Wo Contrast  10/24/2015  CLINICAL DATA:  Worsening pain and swelling in the foot. Prior right great toe amputation with debridement 7 days ago. EXAM: MRI OF THE RIGHT FOREFOOT WITHOUT AND WITH CONTRAST TECHNIQUE: Multiplanar, multisequence MR imaging was performed both before and after administration of intravenous contrast. The patient was only able to tolerate 1 post-contrast plain before terminating the exam due to discomfort. CONTRAST:  13 cc MultiHance COMPARISON:  04/26/2015 FINDINGS: Osteomyelitis protocol MRI of the foot was obtained, to include the entire foot and ankle. This protocol uses a large field of view to cover the entire foot and ankle, and is suitable for assessing bony structures for osteomyelitis. Due to the large field of view and imaging plane choice, this protocol is less sensitive for assessing small structures such as ligamentous structures of the foot and ankle, compared to a dedicated forefoot or dedicated hindfoot exam. There is an unusual appearance of the amputation site. Much of the first metatarsal has been resected but there is a thin lateral rim of metatarsal cortex extending distally to the expected vicinity of the metatarsal head. The first digit sesamoids are visible on image 49 series 9, but without significant adjacent support structure and with apparent discontinuity of the flexor hallucis longus just proximal to the sesamoids  between images 47 and 48 of series 9. Overlying wound noted at the metatarsal resection site dorsum medially. There is subcutaneous edema and enhancement tracking along the plantar foot, plantar musculature of the foot, and into the toes, suspicious for cellulitis. Additionally, along the plantar subcutaneous tissues of the foot there is extensive high T2 signal with hypoenhancement favoring inflammatory phlegmon or potentially early necrotic tissue. No gas tracks in this vicinity. There is a lesser degree of rind like marginal enhancement and more internal high T1 reticulation within mass region than I would expect for overt abscess. Correlate with any fluctuance along the plantar midfoot and metatarsals. I do not identify any overt osteomyelitis of the remaining bony structures. : IMPRESSION: 1. Unusual resection of the first metatarsal appears to leave the metatarsal base and the lateral cortex of the shaft extending to the expected location of the first metatarsal head, but the rest of the bone is absent. First digit sesamoids are still present. No active osteomyelitis currently seen. There is an overlying dorsomedial wound along the metatarsal resection site. Consider conventional radiography of the foot to further correlate with the expected post amputation appearance. 2. Cellulitis in the foot, with suspected extensive phlegmon and possibly some early necrotic tissue along the plantar foot, correlate with any fluctuance in this vicinity in assessing for subcutaneous abscesses. 3. Low-level enhancement along the plantar musculature of the foot may reflect myositis. Electronically Signed   By: Gaylyn Rong M.D.   On: 10/24/2015 14:16    Scheduled Meds:  Scheduled Meds: . amLODipine  10 mg Oral Daily  . enoxaparin (LOVENOX) injection  40 mg Subcutaneous Q24H  . escitalopram  10 mg Oral Daily  . HYDROcodone-acetaminophen      . insulin aspart  0-15 Units Subcutaneous TID WC  . insulin aspart  0-5  Units Subcutaneous QHS  . morphine      . morphine      . morphine      . morphine      . pantoprazole  40 mg Oral Daily  . predniSONE  10 mg Oral Q breakfast  . vancomycin  1,500 mg Intravenous Q24H   Continuous Infusions:    Time spent on care of this patient: 35 min   Dyan Labarbera, MD 10/26/2015, 12:22 PM  LOS: 2 days   [facility] Office  [phone] Pager - Text Page per [email or web address] If 7PM-7AM, please contact night-coverage [email or web address]

## 2015-10-26 NOTE — Progress Notes (Signed)
Pharmacy Antibiotic Follow-up Note  Allison Beasley is a 55 y.o. year-old female admitted on 10/24/2015.  The patient is currently on day 3/1 of Vanc/Zosyn for R foot cellulitis. Zosyn restarted today. MRI neg for osteo, does have surface abscess. S/p I&D on 1/2. Failed augmentin outpatient. Afeb, WBC down to wnl.  Plan: - Vancomycin 1500 mg IV q24h  - Restart Zosyn 3.375g IV ( inf); then 3.375g IV q8h  - Monitor C&S, vitals and clinical progression  - Monitor renal function and VT at steady state if continuing   Temp (24hrs), Avg:98.7 F (37.1 C), Min:97.9 F (36.6 C), Max:99.5 F (37.5 C)   Recent Labs Lab 10/24/15 1153 10/24/15 2054 10/25/15 0515 10/26/15 0352  WBC 9.2 9.4 10.9* 8.1    Recent Labs Lab 10/24/15 1153 10/25/15 0515 10/26/15 0352  CREATININE 0.66 0.62 0.71   Estimated Creatinine Clearance: 65.6 mL/min (by C-G formula based on Cr of 0.71).    Allergies  Allergen Reactions  . Clindamycin/Lincomycin Itching and Rash    Possible reaction (took clindamycin and cipro at the same time)  . Doxycycline Nausea Only  . Percocet [Oxycodone-Acetaminophen] Itching  . Tape Itching and Other (See Comments)    Plastic tape causes itching-USE PAPER TAPE ONLY  . Mycophenolate Diarrhea  . Ciprofloxacin Itching and Rash    Possible reaction (took clindamycin and cipro at the same time)     Antimicrobials this admission: Zosyn 12/31>>12/31; 1/2>>  Vanc 12/31>>   Levels/dose changes this admission:  Microbiology results: 12/31 BCx2: ngtd  1/2 abscess:  Surgical PCR neg  Babs Bertin, PharmD, [facility] Clinical Pharmacist Pager [phone] 10/26/2015 12:42 PM

## 2015-10-26 NOTE — H&P (View-Only) (Signed)
Brief History and Physical  History of Present Illness  Allison Beasley is a 55 y.o. female who presents with chief complaint: R foot pain.  This patient is well known to Dr. 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  Reported this patient has had severe pain with swelling of the right foot over the last week.  She notes the foot has decreased in size but continues to be quite painful.  She denies any rest pain or intermittent claudication.    Past Medical History  Diagnosis Date  . Osteoarthritis   . Osteopenia   . Raynaud phenomenon   . Constipation   . Shortness of breath   . GERD (gastroesophageal reflux disease)   . Headache(784.0)   . Anemia   . Hypertension   . Seizures (HCC)     with chid birth 50f 2 childre  . Peripheral vascular disease (HCC)   . Depression   . Anxiety   . Type II diabetes mellitus (HCC)   . Polymyositis (HCC)   . Fibromyalgia   . Pneumonia 03/2012  . Polymyositis [facility])     Past Surgical History  Procedure Laterality Date  . Cesarean section      x3  . Tubal ligation    . Hysteroscopy      w/ D&C with excision of endometrial polyps  . Left great toenail removal  Left 2013    done by [place] [facility]  . Tonsillectomy Bilateral   . Amputation Left 12/24/2012    Procedure: AMPUTATION DIGIT left great toe;  Surgeon: Sherren Kerns, MD;  Location: [facility] OR;  Service: Vascular;  Laterality: Left;  GREAT  . Abdominal aortagram N/A 07/25/2014    Procedure: ABDOMINAL Ronny Flurry;  Surgeon: Sherren Kerns, MD;  Location: [facility] CATH LAB;  Service: Cardiovascular;  Laterality: N/A;  . Peripheral vascular catheterization N/A 05/15/2015    Procedure: Abdominal Aortogram;  Surgeon: Sherren Kerns, MD;  Location: [facility] INVASIVE CV LAB;  Service: Cardiovascular;  Laterality: N/A;  . Colonoscopy    . Amputation Right 05/19/2015    Procedure: AMPUTATION DIGIT;  Surgeon: Sherren Kerns, MD;   Location: [facility] OR;  Service: Vascular;  Laterality: Right;  . Amputation Right 07/22/2015    Procedure: RAY AMPUTATION OF RIGHT GREAT TOE AMPUTATION SITE;  Surgeon: Sherren Kerns, MD;  Location: [facility] OR;  Service: Vascular;  Laterality: Right;    Social History   Social History  . Marital Status: Married    Spouse Name: N/A  . Number of Children: N/A  . Years of Education: N/A   Occupational History  . Not on file.   Social History Main Topics  . Smoking status: Never Smoker   . Smokeless tobacco: Never Used  . Alcohol Use: 0.0 oz/week    0 Standard drinks or equivalent per week     Comment: 05/19/2015 "might have a couple drinks/yr"  . Drug Use: No  . Sexual Activity: Not on file   Other Topics Concern  . Not on file   Social History Narrative    Family History  Problem Relation Age of Onset  . Diabetes Mother   . Hypertension Mother   . Diabetes Father   . Heart disease Father   . Hypertension Sister   . Heart disease Sister     No current facility-administered medications on file prior to encounter.   Current Outpatient Prescriptions on File  Prior to Encounter  Medication Sig Dispense Refill  . amLODipine (NORVASC) 10 MG tablet Take 10 mg by mouth daily.    . cephALEXin (KEFLEX) 500 MG capsule Take 1 capsule (500 mg total) by mouth 3 (three) times daily. 30 capsule 0  . diphenhydrAMINE (BENADRYL) 25 MG tablet Take 25 mg by mouth 2 (two) times daily as needed for itching.    . escitalopram (LEXAPRO) 10 MG tablet Take 10 mg by mouth daily.    . omeprazole (PRILOSEC) 40 MG capsule Take 40 mg by mouth daily.     . oxyCODONE-acetaminophen (PERCOCET/ROXICET) 5-325 MG tablet Take 1 tablet by mouth every 8 (eight) hours as needed for moderate pain. pain 30 tablet 0  . predniSONE (DELTASONE) 10 MG tablet Take 10 mg by mouth daily with breakfast.    . ALPRAZolam (XANAX) 0.25 MG tablet Take 1 tablet (0.25 mg total) by mouth 3 (three) times daily as needed for anxiety. 20  tablet 0  . Chlorpheniramine-Phenylephrine (SINUS/ALLERGY PE PO) Take 1 tablet by mouth 2 (two) times daily as needed (sinus drainage).    . levofloxacin (LEVAQUIN) 500 MG tablet Take 1 tablet (500 mg total) by mouth daily. (Patient not taking: Reported on 10/01/2015) 10 tablet 0    Allergies  Allergen Reactions  . Clindamycin/Lincomycin Itching and Rash    Possible reaction (took clindamycin and cipro at the same time)  . Doxycycline Nausea Only  . Percocet [Oxycodone-Acetaminophen] Itching  . Tape Itching and Other (See Comments)    Plastic tape causes itching-USE PAPER TAPE ONLY  . Mycophenolate Diarrhea  . Ciprofloxacin Itching and Rash    Possible reaction (took clindamycin and cipro at the same time)     REVIEW OF SYSTEMS:  (Positives checked otherwise negative)  CARDIOVASCULAR:   [ ] chest pain,  [ ] chest pressure,  [ ] palpitations,  [ ] shortness of breath when laying flat,  [x] shortness of breath with exertion,   [ ] pain in feet when walking,  [ ] pain in feet when laying flat, [ ] history of blood clot in veins (DVT),  [ ] history of phlebitis,  [ ] swelling in legs,  [ ] varicose veins  PULMONARY:   [ ] productive cough,  [x] asthma,  [ ] wheezing  NEUROLOGIC:   [ ] weakness in arms or legs,  [ ] numbness in arms or legs,  [ ] difficulty speaking or slurred speech,  [ ] temporary loss of vision in one eye,  [ ] dizziness  HEMATOLOGIC:   [ ] bleeding problems,  [ ] problems with blood clotting too easily  MUSCULOSKEL:   [ ] joint pain, [ ] joint swelling [x] painful right plantar surface  GASTROINTEST:   [ ] vomiting blood,  [ ] blood in stool     GENITOURINARY:   [ ] burning with urination,  [ ] blood in urine  PSYCHIATRIC:   [ ] history of major depression  INTEGUMENTARY:   [ ] rashes,  [ ] ulcers  CONSTITUTIONAL:   [ ] fever,  [ ] chills   Physical Examination  Filed Vitals:   10/24/15 2035 10/25/15 0056 10/25/15 0635  10/25/15 1346  BP: 127/76  127/82 124/76  Pulse: 98  102   Temp: 100.4 F (38 C) 99.6 F (37.6 C) 100.3 F (37.9 C) 99.5 F (37.5 C)  TempSrc: Oral Oral Oral Oral  Resp: 18  18 19  Height:        Weight:      SpO2: 98%  99% 100%    General: A&O x 3, WDWN  Pulmonary: Sym exp, good air movt, CTAB, no rales, rhonchi, & wheezing  Cardiac: RRR, Nl S1, S2, no Murmurs, rubs or gallops  Gastrointestinal: soft, NTND, -G/R, - HSM, - masses, - CVAT B  Musculoskeletal: M/S 5/5 throughout except right foot ROM test limited by pain.  Healed bilateral toe amputations.  Prior R dorsal 3rd MT abscess resolved.  Broad ballotable fluid collection in plantar surface overlying the 2nd and 3rd MT with erythema present, no ascending cellulits  Vascular: faintly palpable pedal pulses  Laboratory: CBC:    Component Value Date/Time   WBC 10.9* 10/25/2015 0515   RBC 3.52* 10/25/2015 0515   HGB 9.9* 10/25/2015 0515   HCT 31.7* 10/25/2015 0515   PLT 392 10/25/2015 0515   MCV 90.1 10/25/2015 0515   MCH 28.1 10/25/2015 0515   MCHC 31.2 10/25/2015 0515   RDW 18.9* 10/25/2015 0515   LYMPHSABS 1.7 10/24/2015 1153   MONOABS 0.3 10/24/2015 1153   EOSABS 0.2 10/24/2015 1153   BASOSABS 0.0 10/24/2015 1153    BMP:    Component Value Date/Time   NA 139 10/25/2015 0515   K 3.5 10/25/2015 0515   CL 106 10/25/2015 0515   CO2 24 10/25/2015 0515   GLUCOSE 92 10/25/2015 0515   BUN <5* 10/25/2015 0515   CREATININE 0.62 10/25/2015 0515   CALCIUM 7.9* 10/25/2015 0515   GFRNONAA >60 10/25/2015 0515   GFRAA >60 10/25/2015 0515    Coagulation: Lab Results  Component Value Date   INR 1.11 07/21/2015   INR 1.02 01/13/2013   No results found for: PTT  Lipids: No results found for: CHOL, TRIG, HDL, CHOLHDL, VLDL, LDLCALC, LDLDIRECT   Radiology: Dg Thoracolumabar Spine  10/24/2015  CLINICAL DATA:  Thoracic and lumbar spine pain following fall. Initial encounter. EXAM: THORACOLUMBAR SPINE 1V  COMPARISON:  10/07/2015 FINDINGS: Normal alignment noted. There is no evidence of acute fracture or subluxation. The disc spaces are maintained. Extensive soft tissue calcifications are again noted. IMPRESSION: No evidence of acute abnormality. Electronically Signed   By: Harmon Pier M.D.   On: 10/24/2015 18:09   Mr Foot Right W Wo Contrast  10/24/2015  CLINICAL DATA:  Worsening pain and swelling in the foot. Prior right great toe amputation with debridement 7 days ago. EXAM: MRI OF THE RIGHT FOREFOOT WITHOUT AND WITH CONTRAST TECHNIQUE: Multiplanar, multisequence MR imaging was performed both before and after administration of intravenous contrast. The patient was only able to tolerate 1 post-contrast plain before terminating the exam due to discomfort. CONTRAST:  13 cc MultiHance COMPARISON:  04/26/2015 FINDINGS: Osteomyelitis protocol MRI of the foot was obtained, to include the entire foot and ankle. This protocol uses a large field of view to cover the entire foot and ankle, and is suitable for assessing bony structures for osteomyelitis. Due to the large field of view and imaging plane choice, this protocol is less sensitive for assessing small structures such as ligamentous structures of the foot and ankle, compared to a dedicated forefoot or dedicated hindfoot exam. There is an unusual appearance of the amputation site. Much of the first metatarsal has been resected but there is a thin lateral rim of metatarsal cortex extending distally to the expected vicinity of the metatarsal head. The first digit sesamoids are visible on image 49 series 9, but without significant adjacent support structure and with apparent discontinuity of the flexor hallucis longus  just proximal to the sesamoids between images 47 and 48 of series 9. Overlying wound noted at the metatarsal resection site dorsum medially. There is subcutaneous edema and enhancement tracking along the plantar foot, plantar musculature of the foot, and  into the toes, suspicious for cellulitis. Additionally, along the plantar subcutaneous tissues of the foot there is extensive high T2 signal with hypoenhancement favoring inflammatory phlegmon or potentially early necrotic tissue. No gas tracks in this vicinity. There is a lesser degree of rind like marginal enhancement and more internal high T1 reticulation within mass region than I would expect for overt abscess. Correlate with any fluctuance along the plantar midfoot and metatarsals. I do not identify any overt osteomyelitis of the remaining bony structures. : IMPRESSION: 1. Unusual resection of the first metatarsal appears to leave the metatarsal base and the lateral cortex of the shaft extending to the expected location of the first metatarsal head, but the rest of the bone is absent. First digit sesamoids are still present. No active osteomyelitis currently seen. There is an overlying dorsomedial wound along the metatarsal resection site. Consider conventional radiography of the foot to further correlate with the expected post amputation appearance. 2. Cellulitis in the foot, with suspected extensive phlegmon and possibly some early necrotic tissue along the plantar foot, correlate with any fluctuance in this vicinity in assessing for subcutaneous abscesses. 3. Low-level enhancement along the plantar musculature of the foot may reflect myositis. Electronically Signed   By: Gaylyn Rong M.D.   On: 10/24/2015 14:16    Medical Decision Making  Allison Beasley is a 55 y.o. female who presents with: s/p B 1st ray amputation, R plantar surface abscess   The patient already ate today, so will plan on I&D of R foot tomorrow.  The patient is aware the risks included but are not limited to: bleeding, infection, nerve damage, need for additional procedures including repeat surgical drainage, and possible limb loss from infection.   The patient is aware of the risks and agrees to proceed.  Leonides Sake,  MD [facility] Office: [phone] Pager: [phone]  10/25/2015, 3:11 PM

## 2015-10-27 ENCOUNTER — Encounter (HOSPITAL_COMMUNITY): Payer: Self-pay | Admitting: General Practice

## 2015-10-27 DIAGNOSIS — L02611 Cutaneous abscess of right foot: Secondary | ICD-10-CM

## 2015-10-27 DIAGNOSIS — L03115 Cellulitis of right lower limb: Secondary | ICD-10-CM

## 2015-10-27 LAB — GLUCOSE, CAPILLARY
GLUCOSE-CAPILLARY: 94 mg/dL (ref 65–99)
GLUCOSE-CAPILLARY: 140 mg/dL — AB (ref 65–99)
Glucose-Capillary: 109 mg/dL — ABNORMAL HIGH (ref 65–99)
Glucose-Capillary: 113 mg/dL — ABNORMAL HIGH (ref 65–99)

## 2015-10-27 LAB — VANCOMYCIN, TROUGH: VANCOMYCIN TR: 8 ug/mL — AB (ref 10.0–20.0)

## 2015-10-27 LAB — HEMOGLOBIN A1C
HEMOGLOBIN A1C: 6.5 % — AB (ref 4.8–5.6)
Mean Plasma Glucose: 140 mg/dL

## 2015-10-27 MED ORDER — VANCOMYCIN HCL 10 G IV SOLR
1750.0000 mg | INTRAVENOUS | Status: DC
  Administered 2015-10-27: 1750 mg via INTRAVENOUS
  Filled 2015-10-27 (×2): qty 1750

## 2015-10-27 NOTE — Progress Notes (Addendum)
[facility]  Subjective  - Painful right foot.   Objective 134/78 80 98.2 F (36.8 C) (Oral) 18 95%  Intake/Output Summary (Last 24 hours) at 10/27/15 0815 Last data filed at 10/26/15 1700  Gross per 24 hour  Intake    820 ml  Output      0 ml  Net    820 ml    Right plantar foot wound clean beefy red edges with yellow eschar at wound base. No malodorous smell, no purulent drainage Wet to dry dressing changed at bed side    Assessment/Planning: POD # 1 Incision and drainage of right foot abscess Wet to dry dressing changes  Cultures pending currently on IV antibiotics    Beasley, Allison MAUREEN 10/27/2015 8:15 AM -- Agree with above.  Leukocytosis improved. Will follow up cultures Potentially d/c home tomorrow if wound clean and pain controlled.  Fabienne Bruns, MD [facility] Office: [phone] Pager: [phone]  Laboratory Lab Results:  Recent Labs  10/25/15 0515 10/26/15 0352  WBC 10.9* 8.1  HGB 9.9* 9.6*  HCT 31.7* 32.0*  PLT 392 413*   BMET  Recent Labs  10/25/15 0515 10/26/15 0352  NA 139 142  K 3.5 4.1  CL 106 109  CO2 24 24  GLUCOSE 92 132*  BUN <5* 8  CREATININE 0.62 0.71  CALCIUM 7.9* 8.3*    COAG Lab Results  Component Value Date   INR 1.11 07/21/2015   INR 1.02 01/13/2013   No results found for: PTT

## 2015-10-27 NOTE — Progress Notes (Addendum)
Pharmacy Antibiotic Follow-up Note  Allison Beasley is a 55 y.o. year-old female admitted on 10/24/2015.  The patient is currently on day 4 of vancomycin and 2 of Zosyn for right foot cellulitis.  She is s/p I&D.  Patient's renal function has been stable.  Her vancomycin level is slightly below goal and expect it to be low therapeutic at steady-state.   Plan: - Increase vanc slightly to 1750mg  IV Q24H - Continue Zosyn 3.375gm IV Q8H, 4 hr infusion - Monitor renal fxn, clinical progress, vanc trough as indicated  Temp (24hrs), Avg:98.3 F (36.8 C), Min:98.2 F (36.8 C), Max:98.6 F (37 C)   Recent Labs Lab 10/24/15 1153 10/24/15 2054 10/25/15 0515 10/26/15 0352  WBC 9.2 9.4 10.9* 8.1    Recent Labs Lab 10/24/15 1153 10/25/15 0515 10/26/15 0352  CREATININE 0.66 0.62 0.71   Estimated Creatinine Clearance: 65.6 mL/min (by C-G formula based on Cr of 0.71).    Allergies  Allergen Reactions  . Clindamycin/Lincomycin Itching and Rash    Possible reaction (took clindamycin and cipro at the same time)  . Doxycycline Nausea Only  . Percocet [Oxycodone-Acetaminophen] Itching  . Tape Itching and Other (See Comments)    Plastic tape causes itching-USE PAPER TAPE ONLY  . Mycophenolate Diarrhea  . Ciprofloxacin Itching and Rash    Possible reaction (took clindamycin and cipro at the same time)     Antimicrobials this admission: Zosyn 12/31>>12/31; 1/2>> Vanc 12/31>>  Levels/dose changes this admission: 1/3 VL = 8 mcg/mL on 1500mg  q24 > 1750mg  q24  Microbiology results: 12/31 BCx2: ngtd 1/2 abscess: Surgical PCR neg    Darina Hartwell D. , PharmD, BCPS Pager:  [phone] 10/27/2015, 4:47 PM

## 2015-10-27 NOTE — Evaluation (Addendum)
Physical Therapy Evaluation Patient Details Name: Allison Beasley MRN: [id number] DOB: [birth year] Today's Date: 10/27/2015   History of Present Illness  55 y.o. female presents with pain and swelling of right foot and is admitted for cellulitis and is now incision and drainage of right foot abscess on 10/26/15. PMH: osteoarthritis, osteopenia, hypertension, anemia, seizures, PVD, depression, anxiety, diabetes, fibromyalgia.   Clinical Impression  Patient is s/p above surgery resulting in functional limitations due to the deficits listed below (see PT Problem List). Patient will benefit from skilled PT to increase their independence and safety with mobility to allow discharge to the venue listed below. Able to ambulate 20 feet with min guard and rw.  At this time it is anticipated that the patient will D/C to home with family support when medically ready. Patient is in agreement.       Follow Up Recommendations No PT follow up;Supervision for mobility/OOB    Equipment Recommendations  None recommended by PT;Other (comment) (patient reports having rw at home)    Recommendations for Other Services       Precautions / Restrictions Precautions Precautions: Fall Restrictions Weight Bearing Restrictions:  (to be clarified)      Mobility  Bed Mobility Overal bed mobility: Needs Assistance Bed Mobility: Supine to Sit     Supine to sit: Supervision     General bed mobility comments: HOB elevated approx. 30 degrees  Transfers Overall transfer level: Needs assistance Equipment used: Rolling walker (2 wheeled) Transfers: Sit to/from Stand Sit to Stand: Min assist         General transfer comment: cues for hand placement  Ambulation/Gait Ambulation/Gait assistance: Min guard Ambulation Distance (Feet): 20 Feet Assistive device: Rolling walker (2 wheeled) Gait Pattern/deviations:  (swing-to pattern) Gait velocity: decreased   General Gait Details: no loss of balance with initial  ambulation. No weightbearing restrictions noted, maintaining NWB at this time.   Stairs            Wheelchair Mobility    Modified Rankin (Stroke Patients Only)       Balance Overall balance assessment: Needs assistance Sitting-balance support: No upper extremity supported Sitting balance-Leahy Scale: Good     Standing balance support: Bilateral upper extremity supported Standing balance-Leahy Scale: Poor Standing balance comment: using rw                             Pertinent Vitals/Pain Pain Assessment: 0-10 Pain Score: 6  Pain Location: Rt foot Pain Descriptors / Indicators: Sharp Pain Intervention(s): Limited activity within patient's tolerance;Monitored during session    Home Living Family/patient expects to be discharged to:: Private residence Living Arrangements: Spouse/significant other Available Help at Discharge: Family Type of Home: House Home Access: Stairs to enter Entrance Stairs-Rails: Right Entrance Stairs-Number of Steps: 3-4 Home Layout: One level Home Equipment: Environmental consultant - 2 wheels Additional Comments: daughter will be staying with her upon D/C    Prior Function Level of Independence: Independent with assistive device(s)         Comments: using rw     Hand Dominance        Extremity/Trunk Assessment   Upper Extremity Assessment: Overall WFL for tasks assessed           Lower Extremity Assessment: RLE deficits/detail RLE Deficits / Details: able to raise Rt LE independently       Communication   Communication: No difficulties  Cognition Arousal/Alertness: Awake/alert Behavior During Therapy: WFL for tasks  assessed/performed Overall Cognitive Status: Within Functional Limits for tasks assessed                      General Comments      Exercises        Assessment/Plan    PT Assessment Patient needs continued PT services  PT Diagnosis Difficulty walking;Acute pain   PT Problem List Decreased  strength;Decreased activity tolerance;Decreased balance;Decreased mobility;Decreased knowledge of use of DME;Pain  PT Treatment Interventions DME instruction;Gait training;Stair training;Functional mobility training;Therapeutic activities;Therapeutic exercise;Balance training;Patient/family education   PT Goals (Current goals can be found in the Care Plan section) Acute Rehab PT Goals Patient Stated Goal: To be able to get up and moving again and go home PT Goal Formulation: With patient Time For Goal Achievement: 11/10/15 Potential to Achieve Goals: Good    Frequency Min 3X/week   Barriers to discharge        Co-evaluation               End of Session Equipment Utilized During Treatment: Gait belt Activity Tolerance: Patient tolerated treatment well Patient left: in chair;with call bell/phone within reach Nurse Communication: Mobility status         Time: 1540-1603 PT Time Calculation (min) (ACUTE ONLY): 23 min   Charges:   PT Evaluation $PT Eval Moderate Complexity: 1 Procedure PT Treatments $Gait Training: 8-22 mins   PT G Codes:        Christiane Ha, PT, CSCS Pager [phone] Office [phone]  10/27/2015, 4:54 PM

## 2015-10-27 NOTE — Progress Notes (Signed)
[facility] Progress Note   Allison Beasley  EHM:[id number]  DOB: 05/12/1961  DOA: 10/24/2015 PCP: No PCP Per Patient  Brief narrative: Allison Beasley is a 55 y.o. female presents with pain and swelling of right foot and is admitted for cellulitis. She was given Augmentin by Dr fields prior to [facility] for this but the swelling worsened when she flew to [facility]. She was hospitalized and discharged on 2 antibiotics- was unable to fill one but continued to take the Augmentin. Swelling worsened once again and she presented the [facility] for it.    Subjective: Having pain in foot with ambulating. No new complaints.   Assessment/Plan: Active Problems:   Cellulitis- plantar aspect right foot - in immunosuppressed patient - failed treatment with Augmentin- second hospitalization for this - she failed to pick up the second antibiotic she was prescribed on discharge but continued to take the Augmentin which she had already failed. - cont Vanc and Zosyn - MRI negative for osteo - s/p I and D - follow cultures - blood cx negative  Polymyositis - cont Prednisone  Diet control DM - on ISS- sugars controlled  Lower back pain - after a fall this week-she is able to ambulate  HTN - cont Norvasc  Multiple amputated toes   Antibiotics: Anti-infectives    Start     Dose/Rate Route Frequency Ordered Stop   10/26/15 2100  piperacillin-tazobactam (ZOSYN) IVPB 3.375 g     3.375 g 12.5 mL/hr over 240 Minutes Intravenous 3 times per day 10/26/15 1239     10/26/15 1245  piperacillin-tazobactam (ZOSYN) IVPB 3.375 g     3.375 g 100 mL/hr over 30 Minutes Intravenous  Once 10/26/15 1239 10/26/15 1349   10/26/15 0000  vancomycin (VANCOCIN) IVPB 1000 mg/200 mL premix  Status:  Discontinued     1,000 mg 200 mL/hr over 60 Minutes Intravenous To Surgery 10/25/15 1521 10/25/15 1530   10/25/15 1600  vancomycin (VANCOCIN) 1,500 mg in sodium chloride 0.9 % 500 mL IVPB     1,500 mg 250 mL/hr  over 120 Minutes Intravenous Every 24 hours 10/24/15 1822     10/24/15 1945  vancomycin (VANCOCIN) IVPB 1000 mg/200 mL premix  Status:  Discontinued     1,000 mg 200 mL/hr over 60 Minutes Intravenous  Once 10/24/15 1938 10/24/15 1945   10/24/15 1600  piperacillin-tazobactam (ZOSYN) IVPB 3.375 g     3.375 g 12.5 mL/hr over 240 Minutes Intravenous  Once 10/24/15 1547 10/24/15 1801   10/24/15 1600  vancomycin (VANCOCIN) IVPB 1000 mg/200 mL premix     1,000 mg 200 mL/hr over 60 Minutes Intravenous  Once 10/24/15 1547 10/24/15 1801     Code Status:     Code Status Orders        Start     Ordered   10/24/15 1650  Full code   Continuous     10/24/15 1649     Family Communication:   Disposition Plan: cont antibiotics DVT prophylaxis: Lovenox Consultants: surgery Procedures:     Objective: Filed Weights   10/24/15 1022  Weight: 58.968 kg (130 lb)    Intake/Output Summary (Last 24 hours) at 10/27/15 1448 Last data filed at 10/27/15 0830  Gross per 24 hour  Intake    600 ml  Output      0 ml  Net    600 ml     Vitals Filed Vitals:   10/26/15 1336 10/26/15 2051 10/27/15 0455 10/27/15 1400  BP:  125/77 119/79 134/78 119/65  Pulse: 83 85 80 79  Temp: 97.8 F (36.6 C) 98.2 F (36.8 C) 98.2 F (36.8 C) 98.6 F (37 C)  TempSrc: Oral Oral Oral   Resp: 20 20 18 16   Height:      Weight:      SpO2: 97% 94% 95% 97%    Exam:  General:  Pt is alert, not in acute distress  HEENT: No icterus, No thrush, oral mucosa moist  Cardiovascular: regular rate and rhythm, S1/S2 No murmur  Respiratory: clear to auscultation bilaterally   Abdomen: Soft, +Bowel sounds, non tender, non distended, no guarding  MSK: No cyanosis or clubbing-multiple amputated toes bilaterally - dressing on right foot not opened   Data Reviewed: Basic Metabolic Panel:  Recent Labs Lab 10/24/15 1153 10/25/15 0515 10/26/15 0352  NA 141 139 142  K 3.7 3.5 4.1  CL 108 106 109  CO2 25 24 24    GLUCOSE 115* 92 132*  BUN 6 <5* 8  CREATININE 0.66 0.62 0.71  CALCIUM 8.4* 7.9* 8.3*   Liver Function Tests:  Recent Labs Lab 10/24/15 1153 10/25/15 0515  AST 64* 47*  ALT 40 29  ALKPHOS 134* 106  BILITOT 0.3 0.5  PROT 8.0 7.0  ALBUMIN 2.7* 2.2*   No results for input(s): LIPASE, AMYLASE in the last 168 hours. No results for input(s): AMMONIA in the last 168 hours. CBC:  Recent Labs Lab 10/24/15 1153 10/24/15 2054 10/25/15 0515 10/26/15 0352  WBC 9.2 9.4 10.9* 8.1  NEUTROABS 7.0  --   --   --   HGB 10.6* 9.7* 9.9* 9.6*  HCT 33.8* 32.5* 31.7* 32.0*  MCV 89.2 90.8 90.1 90.1  PLT 386 348 392 413*   Cardiac Enzymes: No results for input(s): CKTOTAL, CKMB, CKMBINDEX, TROPONINI in the last 168 hours. BNP (last 3 results) No results for input(s): BNP in the last 8760 hours.  ProBNP (last 3 results) No results for input(s): PROBNP in the last 8760 hours.  CBG:  Recent Labs Lab 10/26/15 1148 10/26/15 1644 10/26/15 2129 10/27/15 0621 10/27/15 1124  GLUCAP 133* 144* 123* 94 109*    Recent Results (from the past 240 hour(s))  Blood culture (routine x 2)     Status: None (Preliminary result)   Collection Time: 10/24/15 11:44 AM  Result Value Ref Range Status   Specimen Description BLOOD RIGHT ANTECUBITAL  Final   Special Requests BOTTLES DRAWN AEROBIC AND ANAEROBIC 5CC  Final   Culture NO GROWTH 3 DAYS  Final   Report Status PENDING  Incomplete  Blood culture (routine x 2)     Status: None (Preliminary result)   Collection Time: 10/24/15 11:46 AM  Result Value Ref Range Status   Specimen Description BLOOD RIGHT ANTECUBITAL  Final   Special Requests BOTTLES DRAWN AEROBIC AND ANAEROBIC 5CC  Final   Culture NO GROWTH 3 DAYS  Final   Report Status PENDING  Incomplete  Surgical pcr screen     Status: None   Collection Time: 10/25/15  5:11 PM  Result Value Ref Range Status   MRSA, PCR NEGATIVE NEGATIVE Final   Staphylococcus aureus NEGATIVE NEGATIVE Final     Comment:        The Xpert SA Assay (FDA approved for NASAL specimens in patients over 16 years of age), is one component of a comprehensive surveillance program.  Test performance has been validated by [facility] for patients greater than or equal to 4 year old. It is not  intended to diagnose infection nor to guide or monitor treatment.   Culture, routine-abscess     Status: None (Preliminary result)   Collection Time: 10/26/15  8:31 AM  Result Value Ref Range Status   Specimen Description ABSCESS FOOT RIGHT  Final   Special Requests POF VANCOMYCIN  Final   Gram Stain   Final    ABUNDANT WBC PRESENT,BOTH PMN AND MONONUCLEAR NO SQUAMOUS EPITHELIAL CELLS SEEN NO ORGANISMS SEEN Performed at [facility]    Culture   Final    Culture reincubated for better growth Performed at [facility]    Report Status PENDING  Incomplete     Studies: No results found.  Scheduled Meds:  Scheduled Meds: . amLODipine  10 mg Oral Daily  . enoxaparin (LOVENOX) injection  40 mg Subcutaneous Q24H  . escitalopram  10 mg Oral Daily  . insulin aspart  0-15 Units Subcutaneous TID WC  . insulin aspart  0-5 Units Subcutaneous QHS  . pantoprazole  40 mg Oral Daily  . piperacillin-tazobactam (ZOSYN)  IV  3.375 g Intravenous 3 times per day  . predniSONE  10 mg Oral Q breakfast  . vancomycin  1,500 mg Intravenous Q24H   Continuous Infusions:    Time spent on care of this patient: 35 min   Vint Pola, MD 10/27/2015, 2:48 PM  LOS: 3 days   [facility] Office  [phone] Pager - Text Page per [email or web address] If 7PM-7AM, please contact night-coverage [email or web address]

## 2015-10-28 ENCOUNTER — Encounter (HOSPITAL_COMMUNITY): Payer: Self-pay | Admitting: Plastic Surgery

## 2015-10-28 ENCOUNTER — Other Ambulatory Visit: Payer: Self-pay | Admitting: Plastic Surgery

## 2015-10-28 DIAGNOSIS — I739 Peripheral vascular disease, unspecified: Secondary | ICD-10-CM

## 2015-10-28 DIAGNOSIS — L97513 Non-pressure chronic ulcer of other part of right foot with necrosis of muscle: Secondary | ICD-10-CM

## 2015-10-28 DIAGNOSIS — M3320 Polymyositis, organ involvement unspecified: Secondary | ICD-10-CM

## 2015-10-28 DIAGNOSIS — L02611 Cutaneous abscess of right foot: Principal | ICD-10-CM

## 2015-10-28 DIAGNOSIS — L03115 Cellulitis of right lower limb: Secondary | ICD-10-CM

## 2015-10-28 LAB — GLUCOSE, CAPILLARY
GLUCOSE-CAPILLARY: 135 mg/dL — AB (ref 65–99)
Glucose-Capillary: 77 mg/dL (ref 65–99)
Glucose-Capillary: 104 mg/dL — ABNORMAL HIGH (ref 65–99)
Glucose-Capillary: 178 mg/dL — ABNORMAL HIGH (ref 65–99)

## 2015-10-28 MED ORDER — CEPHALEXIN 500 MG PO CAPS
500.0000 mg | ORAL_CAPSULE | Freq: Three times a day (TID) | ORAL | Status: DC
  Administered 2015-10-28 (×2): 500 mg via ORAL
  Filled 2015-10-28 (×2): qty 1

## 2015-10-28 MED ORDER — LEVOFLOXACIN 500 MG PO TABS
500.0000 mg | ORAL_TABLET | ORAL | Status: DC
  Administered 2015-10-28 – 2015-10-31 (×4): 500 mg via ORAL
  Filled 2015-10-28 (×4): qty 1

## 2015-10-28 MED ORDER — TRAMADOL HCL 50 MG PO TABS: 50.0000 mg | ORAL_TABLET | Freq: Four times a day (QID) | ORAL | Status: DC | PRN

## 2015-10-28 MED ORDER — ENOXAPARIN SODIUM 40 MG/0.4ML ~~LOC~~ SOLN
40.0000 mg | SUBCUTANEOUS | Status: DC
  Administered 2015-10-29 – 2015-10-31 (×3): 40 mg via SUBCUTANEOUS
  Filled 2015-10-28 (×3): qty 0.4

## 2015-10-28 NOTE — Progress Notes (Signed)
[facility] Progress Note   Allison Beasley  HXT:[id number]  DOB: [birth year]  DOA: 10/24/2015 PCP: No PCP Per Patient  Brief narrative: Allison Beasley is a 55 y.o. female presents with pain and swelling of right foot and is admitted for cellulitis. She was given Augmentin by Dr fields prior to [facility] for this but the swelling worsened when she flew to [facility]. She was hospitalized and discharged on 2 antibiotics- was unable to fill one but continued to take the Augmentin. Swelling worsened once again and she presented the [facility] for it.    Subjective: Having pain in foot with ambulating. No new complaints.   Assessment/Plan: Active Problems:    Cellulitis/Abscess  -She initially presented on 10/24/2015 with complaints of right foot pain and swelling. Further workup of MRI was negative for osteomyelitis. -Imaging studies however, revealed abscess. -On 10/26/2015 she was taken to the operating room where she underwent incision and drainage of right foot abscess, procedure performed by Dr. Imogene Burn of vascular surgery. -Abscesses cultures growing a few gram-negative rods. Blood cultures show no growth to date. -On 10/28/2015 1 suspected, looks clean. MRSA PCR negative.  -It appears patient having outpatient failure to Augmentin. Has allergies to clindamycin, doxycycline and ciprofloxacin. -Will transition to Keflex  Polymyositis - cont Prednisone  Diet control DM - on ISS- sugars controlled  Lower back pain - after a fall this week-she is able to ambulate  HTN - cont Norvasc  Multiple amputated toes   Antibiotics: Anti-infectives    Start     Dose/Rate Route Frequency Ordered Stop   10/27/15 1700  vancomycin (VANCOCIN) 1,750 mg in sodium chloride 0.9 % 500 mL IVPB     1,750 mg 250 mL/hr over 120 Minutes Intravenous Every 24 hours 10/27/15 1647     10/26/15 2100  piperacillin-tazobactam (ZOSYN) IVPB 3.375 g     3.375 g 12.5 mL/hr over 240 Minutes Intravenous 3  times per day 10/26/15 1239     10/26/15 1245  piperacillin-tazobactam (ZOSYN) IVPB 3.375 g     3.375 g 100 mL/hr over 30 Minutes Intravenous  Once 10/26/15 1239 10/26/15 1349   10/26/15 0000  vancomycin (VANCOCIN) IVPB 1000 mg/200 mL premix  Status:  Discontinued     1,000 mg 200 mL/hr over 60 Minutes Intravenous To Surgery 10/25/15 1521 10/25/15 1530   10/25/15 1600  vancomycin (VANCOCIN) 1,500 mg in sodium chloride 0.9 % 500 mL IVPB  Status:  Discontinued     1,500 mg 250 mL/hr over 120 Minutes Intravenous Every 24 hours 10/24/15 1822 10/27/15 1647   10/24/15 1945  vancomycin (VANCOCIN) IVPB 1000 mg/200 mL premix  Status:  Discontinued     1,000 mg 200 mL/hr over 60 Minutes Intravenous  Once 10/24/15 1938 10/24/15 1945   10/24/15 1600  piperacillin-tazobactam (ZOSYN) IVPB 3.375 g     3.375 g 12.5 mL/hr over 240 Minutes Intravenous  Once 10/24/15 1547 10/24/15 1801   10/24/15 1600  vancomycin (VANCOCIN) IVPB 1000 mg/200 mL premix     1,000 mg 200 mL/hr over 60 Minutes Intravenous  Once 10/24/15 1547 10/24/15 1801     Code Status:     Code Status Orders        Start     Ordered   10/24/15 1650  Full code   Continuous     10/24/15 1649     Family Communication:   Disposition Plan: cont antibiotics DVT prophylaxis: Lovenox Consultants: surgery Procedures:     Objective: [employer]  10/24/15 1022  Weight: 58.968 kg (130 lb)   No intake or output data in the 24 hours ending 10/28/15 1149   Vitals Filed Vitals:   10/27/15 1400 10/27/15 2051 10/28/15 0500 10/28/15 0850  BP: 119/65 141/74 129/78 140/81  Pulse: 79 77 74   Temp: 98.6 F (37 C) 98.4 F (36.9 C) 98.2 F (36.8 C)   TempSrc:  Oral Oral   Resp: 16 16 18    Height:      Weight:      SpO2: 97% 98% 97%     Exam:  General:  Pt is alert, not in acute distress  HEENT: No icterus, No thrush, oral mucosa moist  Cardiovascular: regular rate and rhythm, S1/S2 No murmur  Respiratory: clear to  auscultation bilaterally   Abdomen: Soft, +Bowel sounds, non tender, non distended, no guarding  MSK: Incision and drainage site of right foot abscess inspected, her wound appears well-healed, fibrinous tissue, no purulence or malodor   Data Reviewed: Basic Metabolic Panel:  Recent Labs Lab 10/24/15 1153 10/25/15 0515 10/26/15 0352  NA 141 139 142  K 3.7 3.5 4.1  CL 108 106 109  CO2 25 24 24   GLUCOSE 115* 92 132*  BUN 6 <5* 8  CREATININE 0.66 0.62 0.71  CALCIUM 8.4* 7.9* 8.3*   Liver Function Tests:  Recent Labs Lab 10/24/15 1153 10/25/15 0515  AST 64* 47*  ALT 40 29  ALKPHOS 134* 106  BILITOT 0.3 0.5  PROT 8.0 7.0  ALBUMIN 2.7* 2.2*   No results for input(s): LIPASE, AMYLASE in the last 168 hours. No results for input(s): AMMONIA in the last 168 hours. CBC:  Recent Labs Lab 10/24/15 1153 10/24/15 2054 10/25/15 0515 10/26/15 0352  WBC 9.2 9.4 10.9* 8.1  NEUTROABS 7.0  --   --   --   HGB 10.6* 9.7* 9.9* 9.6*  HCT 33.8* 32.5* 31.7* 32.0*  MCV 89.2 90.8 90.1 90.1  PLT 386 348 392 413*   Cardiac Enzymes: No results for input(s): CKTOTAL, CKMB, CKMBINDEX, TROPONINI in the last 168 hours. BNP (last 3 results) No results for input(s): BNP in the last 8760 hours.  ProBNP (last 3 results) No results for input(s): PROBNP in the last 8760 hours.  CBG:  Recent Labs Lab 10/27/15 1124 10/27/15 1617 10/27/15 2139 10/28/15 0634 10/28/15 1132  GLUCAP 109* 113* 140* 77 135*    Recent Results (from the past 240 hour(s))  Blood culture (routine x 2)     Status: None (Preliminary result)   Collection Time: 10/24/15 11:44 AM  Result Value Ref Range Status   Specimen Description BLOOD RIGHT ANTECUBITAL  Final   Special Requests BOTTLES DRAWN AEROBIC AND ANAEROBIC 5CC  Final   Culture NO GROWTH 3 DAYS  Final   Report Status PENDING  Incomplete  Blood culture (routine x 2)     Status: None (Preliminary result)   Collection Time: 10/24/15 11:46 AM  Result  Value Ref Range Status   Specimen Description BLOOD RIGHT ANTECUBITAL  Final   Special Requests BOTTLES DRAWN AEROBIC AND ANAEROBIC 5CC  Final   Culture NO GROWTH 3 DAYS  Final   Report Status PENDING  Incomplete  Surgical pcr screen     Status: None   Collection Time: 10/25/15  5:11 PM  Result Value Ref Range Status   MRSA, PCR NEGATIVE NEGATIVE Final   Staphylococcus aureus NEGATIVE NEGATIVE Final    Comment:        The Xpert SA Assay (FDA  approved for NASAL specimens in patients over 85 years of age), is one component of a comprehensive surveillance program.  Test performance has been validated by [facility] for patients greater than or equal to 91 year old. It is not intended to diagnose infection nor to guide or monitor treatment.   Anaerobic culture     Status: None (Preliminary result)   Collection Time: 10/26/15  8:31 AM  Result Value Ref Range Status   Specimen Description ABSCESS FOOT RIGHT  Final   Special Requests POF VANCOMYCIN  Final   Gram Stain PENDING  Incomplete   Culture   Final    NO ANAEROBES ISOLATED; CULTURE IN PROGRESS FOR 5 DAYS Performed at [facility]    Report Status PENDING  Incomplete  Culture, routine-abscess     Status: None (Preliminary result)   Collection Time: 10/26/15  8:31 AM  Result Value Ref Range Status   Specimen Description ABSCESS FOOT RIGHT  Final   Special Requests POF VANCOMYCIN  Final   Gram Stain   Final    ABUNDANT WBC PRESENT,BOTH PMN AND MONONUCLEAR NO SQUAMOUS EPITHELIAL CELLS SEEN NO ORGANISMS SEEN Performed at [facility]    Culture   Final    FEW GRAM NEGATIVE RODS Performed at [facility]    Report Status PENDING  Incomplete     Studies: No results found.  Scheduled Meds:  Scheduled Meds: . amLODipine  10 mg Oral Daily  . enoxaparin (LOVENOX) injection  40 mg Subcutaneous Q24H  . escitalopram  10 mg Oral Daily  . insulin aspart  0-15 Units Subcutaneous TID WC  . insulin  aspart  0-5 Units Subcutaneous QHS  . pantoprazole  40 mg Oral Daily  . piperacillin-tazobactam (ZOSYN)  IV  3.375 g Intravenous 3 times per day  . predniSONE  10 mg Oral Q breakfast  . vancomycin  1,750 mg Intravenous Q24H   Continuous Infusions:    Time spent on care of this patient: 25 min   Jeralyn Bennett, MD 10/28/2015, 11:49 AM  LOS: 4 days   [facility] Office  [phone] Pager - Text Page per [email or web address] If 7PM-7AM, please contact night-coverage [email or web address]

## 2015-10-28 NOTE — Progress Notes (Signed)
Per the patient PA from vascular changed dressing this morning during rounding

## 2015-10-28 NOTE — Progress Notes (Addendum)
[facility] Progress Note  Subjective  - Right foot pain   Objective Filed Vitals:   10/27/15 2051 10/28/15 0500  BP: 141/74 129/78  Pulse: 77 74  Temp: 98.4 F (36.9 C) 98.2 F (36.8 C)  Resp: 16 18    Intake/Output Summary (Last 24 hours) at 10/28/15 8527 Last data filed at 10/27/15 0830  Gross per 24 hour  Intake    240 ml  Output      0 ml  Net    240 ml   Right foot with wound at plantar aspect. Wound with yellow/white fibrinous exudate at base with surrounding red granulation tissue. No odor or drainage seen.   Assessment/Planning: 55 y.o. female is s/p: incision and drainage right foot abscess 2 Days Post-Op   Wound appears clean. Unchanged from yesterday's exam.  Continue wet to dry dressings.  Right foot still very painful.  Cultures still pending. Continue IV abx.  Will d/w Dr. Darrick Penna about dispo.   Raymond Gurney 10/28/2015 8:12 AM -- Wound overall clean Cultures negative Would cover with broad spectrum oral antibiotic, Levaquin but will need to watch for rash ? With clinda or cipro in past Needs better pain control  Will place [facility] tomorrow and will need home VAC approval. Will have Plastic surgery eval since has large segment of tendon exposed.  Fabienne Bruns, MD [facility] [place] Office: [phone] Pager: [phone]  Laboratory CBC    Component Value Date/Time   WBC 8.1 10/26/2015 0352   HGB 9.6* 10/26/2015 0352   HCT 32.0* 10/26/2015 0352   PLT 413* 10/26/2015 0352    BMET    Component Value Date/Time   NA 142 10/26/2015 0352   K 4.1 10/26/2015 0352   CL 109 10/26/2015 0352   CO2 24 10/26/2015 0352   GLUCOSE 132* 10/26/2015 0352   BUN 8 10/26/2015 0352   CREATININE 0.71 10/26/2015 0352   CALCIUM 8.3* 10/26/2015 0352   GFRNONAA >60 10/26/2015 0352   GFRAA >60 10/26/2015 0352    COAG Lab Results  Component Value Date   INR 1.11 07/21/2015   INR 1.02 01/13/2013   No results  found for: PTT  Antibiotics Anti-infectives    Start     Dose/Rate Route Frequency Ordered Stop   10/27/15 1700  vancomycin (VANCOCIN) 1,750 mg in sodium chloride 0.9 % 500 mL IVPB     1,750 mg 250 mL/hr over 120 Minutes Intravenous Every 24 hours 10/27/15 1647     10/26/15 2100  piperacillin-tazobactam (ZOSYN) IVPB 3.375 g     3.375 g 12.5 mL/hr over 240 Minutes Intravenous 3 times per day 10/26/15 1239     10/26/15 1245  piperacillin-tazobactam (ZOSYN) IVPB 3.375 g     3.375 g 100 mL/hr over 30 Minutes Intravenous  Once 10/26/15 1239 10/26/15 1349   10/26/15 0000  vancomycin (VANCOCIN) IVPB 1000 mg/200 mL premix  Status:  Discontinued     1,000 mg 200 mL/hr over 60 Minutes Intravenous To Surgery 10/25/15 1521 10/25/15 1530   10/25/15 1600  vancomycin (VANCOCIN) 1,500 mg in sodium chloride 0.9 % 500 mL IVPB  Status:  Discontinued     1,500 mg 250 mL/hr over 120 Minutes Intravenous Every 24 hours 10/24/15 1822 10/27/15 1647   10/24/15 1945  vancomycin (VANCOCIN) IVPB 1000 mg/200 mL premix  Status:  Discontinued     1,000 mg 200 mL/hr over 60 Minutes Intravenous  Once 10/24/15 1938 10/24/15 1945   10/24/15 1600  piperacillin-tazobactam (ZOSYN) IVPB 3.375 g     3.375 g 12.5 mL/hr over 240 Minutes Intravenous  Once 10/24/15 1547 10/24/15 1801   10/24/15 1600  vancomycin (VANCOCIN) IVPB 1000 mg/200 mL premix     1,000 mg 200 mL/hr over 60 Minutes Intravenous  Once 10/24/15 1547 10/24/15 1801       Maris Berger, PA-C [facility] Office: [phone] Pager: [phone] 10/28/2015 8:12 AM

## 2015-10-28 NOTE — Consult Note (Signed)
Reason for Consult:right foot wound Referring Physician: Dr. Fabienne Bruns  Allison Beasley is an 55 y.o. female.  HPI: The patient is a 55 yrs old bf here for treatment of a right foot abscess.  She has been dealing with pain and wounds on the foot for months.  She had a right great toe amputation followed by wound breakdown and infection twice.  She then went to visit her daughter in [place] and when she got off the plane had a swollen red sore foot. She was seen and admitted via the ED and underwent drainage and debridement of the foot abscess. She now has a ~ 4 x 5 cm wound with tendon exposed.  The past medical history is listed below.    Past Medical History  Diagnosis Date  . Osteoarthritis   . Osteopenia   . Raynaud phenomenon   . Constipation   . Shortness of breath   . GERD (gastroesophageal reflux disease)   . Headache(784.0)   . Anemia   . Hypertension   . Seizures (HCC)     with chid birth 32f 2 childre  . Peripheral vascular disease (HCC)   . Depression   . Anxiety   . Type II diabetes mellitus (HCC)   . Polymyositis (HCC)   . Fibromyalgia   . Pneumonia 03/2012  . Polymyositis [facility])     Past Surgical History  Procedure Laterality Date  . Cesarean section      x3  . Tubal ligation    . Hysteroscopy      w/ D&C with excision of endometrial polyps  . Left great toenail removal  Left 2013    done by [place] [facility]  . Tonsillectomy Bilateral   . Amputation Left 12/24/2012    Procedure: AMPUTATION DIGIT left great toe;  Surgeon: Sherren Kerns, MD;  Location: [facility] OR;  Service: Vascular;  Laterality: Left;  GREAT  . Abdominal aortagram N/A 07/25/2014    Procedure: ABDOMINAL Ronny Flurry;  Surgeon: Sherren Kerns, MD;  Location: [facility] CATH LAB;  Service: Cardiovascular;  Laterality: N/A;  . Peripheral vascular catheterization N/A 05/15/2015    Procedure: Abdominal Aortogram;  Surgeon: Sherren Kerns, MD;  Location: [facility] INVASIVE CV LAB;  Service: Cardiovascular;  Laterality: N/A;   . Colonoscopy    . Amputation Right 05/19/2015    Procedure: AMPUTATION DIGIT;  Surgeon: Sherren Kerns, MD;  Location: [facility] OR;  Service: Vascular;  Laterality: Right;  . Amputation Right 07/22/2015    Procedure: RAY AMPUTATION OF RIGHT GREAT TOE AMPUTATION SITE;  Surgeon: Sherren Kerns, MD;  Location: [facility] OR;  Service: Vascular;  Laterality: Right;  . Irrigation and debridement foot Right 10/26/2015  . I&d extremity Right 10/26/2015    Procedure: IRRIGATION AND DEBRIDEMENT EXTREMITY/RIGHT FOOT;  Surgeon: Fransisco Hertz, MD;  Location: [facility] OR;  Service: Vascular;  Laterality: Right;    Family History  Problem Relation Age of Onset  . Diabetes Mother   . Hypertension Mother   . Diabetes Father   . Heart disease Father   . Hypertension Sister   . Heart disease Sister     Social History:  reports that she has never smoked. She has never used smokeless tobacco. She reports that she drinks alcohol. She reports that she does not use illicit drugs.  Allergies:  Allergies  Allergen Reactions  . Clindamycin/Lincomycin Itching and Rash    Possible reaction (took clindamycin and cipro at the same time)  . Doxycycline Nausea Only  .  Percocet [Oxycodone-Acetaminophen] Itching  . Tape Itching and Other (See Comments)    Plastic tape causes itching-USE PAPER TAPE ONLY  . Mycophenolate Diarrhea  . Ciprofloxacin Itching and Rash    Possible reaction (took clindamycin and cipro at the same time)     Medications: I have reviewed the patient's current medications.  Results for orders placed or performed during the hospital encounter of 10/24/15 (from the past 48 hour(s))  Glucose, capillary     Status: Abnormal   Collection Time: 10/26/15  4:44 PM  Result Value Ref Range   Glucose-Capillary 144 (H) 65 - 99 mg/dL  Glucose, capillary     Status: Abnormal   Collection Time: 10/26/15  9:29 PM  Result Value Ref Range   Glucose-Capillary 123 (H) 65 - 99 mg/dL  Glucose, capillary     Status: None    Collection Time: 10/27/15  6:21 AM  Result Value Ref Range   Glucose-Capillary 94 65 - 99 mg/dL  Glucose, capillary     Status: Abnormal   Collection Time: 10/27/15 11:24 AM  Result Value Ref Range   Glucose-Capillary 109 (H) 65 - 99 mg/dL   Comment 1 Notify RN   Vancomycin, trough     Status: Abnormal   Collection Time: 10/27/15  3:07 PM  Result Value Ref Range   Vancomycin Tr 8 (L) 10.0 - 20.0 ug/mL  Glucose, capillary     Status: Abnormal   Collection Time: 10/27/15  4:17 PM  Result Value Ref Range   Glucose-Capillary 113 (H) 65 - 99 mg/dL   Comment 1 Repeat Test    Comment 2 Document in Chart   Glucose, capillary     Status: Abnormal   Collection Time: 10/27/15  9:39 PM  Result Value Ref Range   Glucose-Capillary 140 (H) 65 - 99 mg/dL  Glucose, capillary     Status: None   Collection Time: 10/28/15  6:34 AM  Result Value Ref Range   Glucose-Capillary 77 65 - 99 mg/dL  Glucose, capillary     Status: Abnormal   Collection Time: 10/28/15 11:32 AM  Result Value Ref Range   Glucose-Capillary 135 (H) 65 - 99 mg/dL   Comment 1 Repeat Test    Comment 2 Document in Chart     No results found.  Review of Systems  Constitutional: Positive for malaise/fatigue.  HENT: Negative.   Eyes: Negative.   Respiratory: Negative.   Cardiovascular: Negative.   Gastrointestinal: Negative.   Genitourinary: Negative.   Musculoskeletal: Negative.   Skin: Negative.   Neurological: Positive for weakness.  Psychiatric/Behavioral: Negative.    Blood pressure 131/76, pulse 84, temperature 98.8 F (37.1 C), temperature source Oral, resp. rate 18, height 5\' 1"  (1.549 m), weight 58.968 kg (130 lb), last menstrual period 06/20/2012, SpO2 100 %. Physical Exam  Constitutional: She is oriented to person, place, and time. She appears well-developed.  HENT:  Head: Normocephalic and atraumatic.  Eyes: Conjunctivae are normal. Pupils are equal, round, and reactive to light.  Cardiovascular: Normal  rate.   Respiratory: No respiratory distress.  Musculoskeletal:       Right ankle: She exhibits swelling and deformity. She exhibits no ecchymosis. Tenderness.  Neurological: She is alert and oriented to person, place, and time.  Skin: Skin is warm.  Psychiatric: She has a normal mood and affect. Her behavior is normal. Judgment and thought content normal.    Assessment/Plan: Recommend debridement with Acell and VAC placement.  This is a serious situation due to her  underlying medical problems.  She is aware of the severity.  Worth every effort to save her foot.  Check prealbumin.  Peggye Form 10/28/2015, 4:18 PM

## 2015-10-29 ENCOUNTER — Encounter (HOSPITAL_COMMUNITY): Payer: Self-pay | Admitting: Certified Registered"

## 2015-10-29 ENCOUNTER — Inpatient Hospital Stay (HOSPITAL_COMMUNITY): Payer: BLUE CROSS/BLUE SHIELD | Admitting: Certified Registered"

## 2015-10-29 ENCOUNTER — Encounter (HOSPITAL_COMMUNITY)
Admission: EM | Disposition: A | Discharge status: Home / Self Care | Payer: Self-pay | Source: Home / Self Care | Attending: Internal Medicine

## 2015-10-29 DIAGNOSIS — I7300 Raynaud's syndrome without gangrene: Secondary | ICD-10-CM

## 2015-10-29 HISTORY — PX: I & D EXTREMITY: SHX5045

## 2015-10-29 LAB — CULTURE, BLOOD (ROUTINE X 2)
CULTURE: NO GROWTH
Culture: NO GROWTH

## 2015-10-29 LAB — GLUCOSE, CAPILLARY
GLUCOSE-CAPILLARY: 80 mg/dL (ref 65–99)
GLUCOSE-CAPILLARY: 113 mg/dL — AB (ref 65–99)
GLUCOSE-CAPILLARY: 155 mg/dL — AB (ref 65–99)
Glucose-Capillary: 115 mg/dL — ABNORMAL HIGH (ref 65–99)
Glucose-Capillary: 126 mg/dL — ABNORMAL HIGH (ref 65–99)
Glucose-Capillary: 116 mg/dL — ABNORMAL HIGH (ref 65–99)

## 2015-10-29 LAB — PREALBUMIN: PREALBUMIN: 20.4 mg/dL (ref 18–38)

## 2015-10-29 LAB — CULTURE, ROUTINE-ABSCESS

## 2015-10-29 SURGERY — IRRIGATION AND DEBRIDEMENT EXTREMITY
Anesthesia: General | Site: Foot | Laterality: Right

## 2015-10-29 MED ORDER — FENTANYL CITRATE (PF) 100 MCG/2ML IJ SOLN
INTRAMUSCULAR | Status: DC | PRN
  Administered 2015-10-29: 25 ug via INTRAVENOUS
  Administered 2015-10-29: 50 ug via INTRAVENOUS
  Administered 2015-10-29: 75 ug via INTRAVENOUS
  Administered 2015-10-29: 100 ug via INTRAVENOUS

## 2015-10-29 MED ORDER — FENTANYL CITRATE (PF) 250 MCG/5ML IJ SOLN
INTRAMUSCULAR | Status: AC
  Filled 2015-10-29: qty 5

## 2015-10-29 MED ORDER — PHENYLEPHRINE HCL 10 MG/ML IJ SOLN
INTRAMUSCULAR | Status: DC | PRN
  Administered 2015-10-29: 120 ug via INTRAVENOUS
  Administered 2015-10-29: 80 ug via INTRAVENOUS
  Administered 2015-10-29: 120 ug via INTRAVENOUS

## 2015-10-29 MED ORDER — HYDROCODONE-ACETAMINOPHEN 5-325 MG PO TABS
ORAL_TABLET | ORAL | Status: AC
  Filled 2015-10-29: qty 2

## 2015-10-29 MED ORDER — CEFAZOLIN SODIUM-DEXTROSE 2-3 GM-% IV SOLR
2.0000 g | INTRAVENOUS | Status: AC
  Administered 2015-10-29: 2 g via INTRAVENOUS

## 2015-10-29 MED ORDER — ONDANSETRON HCL 4 MG/2ML IJ SOLN
INTRAMUSCULAR | Status: AC
  Filled 2015-10-29: qty 2

## 2015-10-29 MED ORDER — LACTATED RINGERS IV SOLN
Freq: Once | INTRAVENOUS | Status: AC
  Administered 2015-10-29: 13:00:00 via INTRAVENOUS

## 2015-10-29 MED ORDER — SODIUM CHLORIDE 0.9 % IR SOLN: Status: DC | PRN

## 2015-10-29 MED ORDER — ONDANSETRON HCL 4 MG/2ML IJ SOLN
INTRAMUSCULAR | Status: DC | PRN
  Administered 2015-10-29: 4 mg via INTRAVENOUS

## 2015-10-29 MED ORDER — FENTANYL CITRATE (PF) 100 MCG/2ML IJ SOLN
INTRAMUSCULAR | Status: AC
  Filled 2015-10-29: qty 2

## 2015-10-29 MED ORDER — ONDANSETRON HCL 4 MG/2ML IJ SOLN: 4.0000 mg | Freq: Once | INTRAMUSCULAR | Status: DC | PRN

## 2015-10-29 MED ORDER — SODIUM CHLORIDE 0.9 % IR SOLN
Status: DC | PRN
  Administered 2015-10-29: 1000 mL

## 2015-10-29 MED ORDER — FENTANYL CITRATE (PF) 100 MCG/2ML IJ SOLN
25.0000 ug | INTRAMUSCULAR | Status: DC | PRN
  Administered 2015-10-29 (×4): 25 ug via INTRAVENOUS

## 2015-10-29 MED ORDER — CHLORHEXIDINE GLUCONATE CLOTH 2 % EX PADS
6.0000 | MEDICATED_PAD | Freq: Every day | CUTANEOUS | Status: DC
  Administered 2015-10-31: 6 via TOPICAL

## 2015-10-29 MED ORDER — LIDOCAINE HCL (CARDIAC) 20 MG/ML IV SOLN
INTRAVENOUS | Status: DC | PRN
  Administered 2015-10-29: 80 mg via INTRAVENOUS

## 2015-10-29 MED ORDER — PROPOFOL 10 MG/ML IV BOLUS
INTRAVENOUS | Status: DC | PRN
  Administered 2015-10-29: 50 mg via INTRAVENOUS
  Administered 2015-10-29: 150 mg via INTRAVENOUS

## 2015-10-29 MED ORDER — LIDOCAINE HCL (CARDIAC) 20 MG/ML IV SOLN
INTRAVENOUS | Status: AC
  Filled 2015-10-29: qty 5

## 2015-10-29 MED ORDER — LACTATED RINGERS IV SOLN
INTRAVENOUS | Status: DC
  Administered 2015-10-29: 15:00:00 via INTRAVENOUS

## 2015-10-29 SURGICAL SUPPLY — 53 items
BANDAGE ACE 4X5 VEL STRL LF (GAUZE/BANDAGES/DRESSINGS) ×2 IMPLANT
BANDAGE ELASTIC 4 VELCRO ST LF (GAUZE/BANDAGES/DRESSINGS) IMPLANT
BLADE SURG ROTATE 9660 (MISCELLANEOUS) IMPLANT
BNDG GAUZE ELAST 4 BULKY (GAUZE/BANDAGES/DRESSINGS) IMPLANT
CANISTER SUCTION 2500CC (MISCELLANEOUS) ×3 IMPLANT
CHLORAPREP W/TINT 26ML (MISCELLANEOUS) IMPLANT
COVER SURGICAL LIGHT HANDLE (MISCELLANEOUS) ×3 IMPLANT
DECANTER SPIKE VIAL GLASS SM (MISCELLANEOUS) ×2 IMPLANT
DRAPE EXTREMITY T 121X128X90 (DRAPE) IMPLANT
DRAPE INCISE IOBAN 66X45 STRL (DRAPES) IMPLANT
DRAPE ORTHO SPLIT 77X108 STRL (DRAPES)
DRAPE PROXIMA HALF (DRAPES) ×2 IMPLANT
DRAPE SURG ORHT 6 SPLT 77X108 (DRAPES) IMPLANT
DRSG ADAPTIC 3X8 NADH LF (GAUZE/BANDAGES/DRESSINGS) IMPLANT
DRSG EMULSION OIL 3X3 NADH (GAUZE/BANDAGES/DRESSINGS) ×2 IMPLANT
DRSG KUZMA FLUFF (GAUZE/BANDAGES/DRESSINGS) ×4 IMPLANT
DRSG PAD ABDOMINAL 8X10 ST (GAUZE/BANDAGES/DRESSINGS) IMPLANT
DRSG VAC ATS LRG SENSATRAC (GAUZE/BANDAGES/DRESSINGS) IMPLANT
DRSG VAC ATS MED SENSATRAC (GAUZE/BANDAGES/DRESSINGS) IMPLANT
DRSG VAC ATS SM SENSATRAC (GAUZE/BANDAGES/DRESSINGS) ×2 IMPLANT
ELECT REM PT RETURN 9FT ADLT (ELECTROSURGICAL) ×3
ELECTRODE REM PT RTRN 9FT ADLT (ELECTROSURGICAL) ×1 IMPLANT
GAUZE SPONGE 4X4 12PLY STRL (GAUZE/BANDAGES/DRESSINGS) IMPLANT
GEL ULTRASOUND 20GR AQUASONIC (MISCELLANEOUS) IMPLANT
GLOVE BIO SURGEON STRL SZ 6.5 (GLOVE) ×6 IMPLANT
GLOVE BIO SURGEON STRL SZ7 (GLOVE) ×2 IMPLANT
GLOVE BIO SURGEONS STRL SZ 6.5 (GLOVE) ×4
GLOVE BIOGEL PI IND STRL 8.5 (GLOVE) IMPLANT
GLOVE BIOGEL PI INDICATOR 8.5 (GLOVE) ×2
GLOVE SURG SS PI 6.5 STRL IVOR (GLOVE) ×2 IMPLANT
GLOVE SURG SS PI 7.5 STRL IVOR (GLOVE) ×2 IMPLANT
GLOVE SURG SS PI 8.0 STRL IVOR (GLOVE) ×2 IMPLANT
GOWN STRL REUS W/ TWL LRG LVL3 (GOWN DISPOSABLE) ×2 IMPLANT
GOWN STRL REUS W/TWL LRG LVL3 (GOWN DISPOSABLE) ×6
H R LUBE JELLY XXX (MISCELLANEOUS) ×2 IMPLANT
HANDPIECE INTERPULSE COAX TIP (DISPOSABLE)
KIT BASIN OR (CUSTOM PROCEDURE TRAY) ×3 IMPLANT
KIT ROOM TURNOVER OR (KITS) ×3 IMPLANT
MATRIX SURGICAL PSM 5X5CM (Tissue) ×2 IMPLANT
MICROMATRIX 1000MG (Tissue) ×3 IMPLANT
NS IRRIG 1000ML POUR BTL (IV SOLUTION) ×3 IMPLANT
PACK GENERAL/GYN (CUSTOM PROCEDURE TRAY) ×3 IMPLANT
PAD ARMBOARD 7.5X6 YLW CONV (MISCELLANEOUS) ×6 IMPLANT
PAD NEG PRESSURE SENSATRAC (MISCELLANEOUS) IMPLANT
SET COLLECT BLD 21X3/4 12 PB (MISCELLANEOUS) ×2 IMPLANT
SET HNDPC FAN SPRY TIP SCT (DISPOSABLE) IMPLANT
SOLUTION PARTIC MCRMTRX 1000MG (Tissue) IMPLANT
SPONGE GAUZE 4X4 12PLY STER LF (GAUZE/BANDAGES/DRESSINGS) ×2 IMPLANT
SUT SILK 3 0 SH 30 (SUTURE) ×2 IMPLANT
TOWEL OR 17X24 6PK STRL BLUE (TOWEL DISPOSABLE) IMPLANT
TOWEL OR 17X26 10 PK STRL BLUE (TOWEL DISPOSABLE) ×3 IMPLANT
UNDERPAD 30X30 INCONTINENT (UNDERPADS AND DIAPERS) ×3 IMPLANT
WATER STERILE IRR 1000ML POUR (IV SOLUTION) IMPLANT

## 2015-10-29 NOTE — Anesthesia Preprocedure Evaluation (Addendum)
Anesthesia Evaluation    Airway Mallampati: II  TM Distance: >3 FB Neck ROM: Full    Dental  (+) Teeth Intact, Dental Advisory Given   Pulmonary    breath sounds clear to auscultation       Cardiovascular hypertension,  Rhythm:Regular     Neuro/Psych    GI/Hepatic   Endo/Other  diabetes  Renal/GU      Musculoskeletal   Abdominal   Peds  Hematology   Anesthesia Other Findings   Reproductive/Obstetrics                            Anesthesia Physical Anesthesia Plan  ASA: III  Anesthesia Plan: General   Post-op Pain Management:    Induction: Intravenous  Airway Management Planned: LMA  Additional Equipment:   Intra-op Plan:   Post-operative Plan:   Informed Consent: I have reviewed the patients History and Physical, chart, labs and discussed the procedure including the risks, benefits and alternatives for the proposed anesthesia with the patient or authorized representative who has indicated his/her understanding and acceptance.   Dental advisory given  Plan Discussed with: CRNA and Anesthesiologist  Anesthesia Plan Comments:         Anesthesia Quick Evaluation

## 2015-10-29 NOTE — Consult Note (Signed)
WOC consulted for NPWT VAC therapy placement today, however after review of the chart plastic surgery has also evaluated the patient and plans to take the patient to the OR for placement of ACell and VAC.  Verified with Dr. Ulice Bold plans for today to take Allison Beasley to OR.  WOC will not consult for this reason.   Re consult if needed, will not follow at this time. Thanks  Sandee Bernath [employer], CWOCN [phone])

## 2015-10-29 NOTE — Anesthesia Procedure Notes (Signed)
Procedure Name: LMA Insertion Date/Time: 10/29/2015 2:51 PM Performed by: Jefm Miles E Pre-anesthesia Checklist: Patient identified, Emergency Drugs available, Suction available, Patient being monitored and Timeout performed Patient Re-evaluated:Patient Re-evaluated prior to inductionOxygen Delivery Method: Circle system utilized Preoxygenation: Pre-oxygenation with 100% oxygen Intubation Type: IV induction Ventilation: Mask ventilation without difficulty LMA: LMA inserted LMA Size: 4.0 Number of attempts: 1 Placement Confirmation: positive ETCO2 and breath sounds checked- equal and bilateral Tube secured with: Tape Dental Injury: Teeth and Oropharynx as per pre-operative assessment

## 2015-10-29 NOTE — Progress Notes (Signed)
Physical Therapy Cancellation Note   10/29/15 1513  PT Visit Information  Last PT Received On 10/29/15  Reason Eval/Treat Not Completed Patient at procedure or test/unavailable  History of Present Illness 55 y.o. female presents with pain and swelling of right foot and is admitted for cellulitis and is now incision and drainage of right foot abscess on 10/26/15. PMH: osteoarthritis, osteopenia, hypertension, anemia, seizures, PVD, depression, anxiety, diabetes, fibromyalgia.   Erline Levine, PTA Pager: [phone]

## 2015-10-29 NOTE — Interval H&P Note (Signed)
History and Physical Interval Note:  10/29/2015 2:12 PM  Allison Beasley  has presented today for surgery, with the diagnosis of Right Foot Wound  The various methods of treatment have been discussed with the patient and family. After consideration of risks, benefits and other options for treatment, the patient has consented to  Procedure(s): IRRIGATION AND DEBRIDEMENT RIGHT FOOT WITH ACELL AND VAC PLACEMENT (Right) as a surgical intervention .  The patient's history has been reviewed, patient examined, no change in status, stable for surgery.  I have reviewed the patient's chart and labs.  Questions were answered to the patient's satisfaction.     Peggye Form

## 2015-10-29 NOTE — H&P (View-Only) (Signed)
Reason for Consult:right foot wound Referring Physician: Dr. Charles Fields  Allison Beasley is an 54 y.o. female.  HPI: The patient is a 54 yrs old bf here for treatment of a right foot abscess.  She has been dealing with pain and wounds on the foot for months.  She had a right great toe amputation followed by wound breakdown and infection twice.  She then went to visit her daughter in [place] and when she got off the plane had a swollen red sore foot. She was seen and admitted via the ED and underwent drainage and debridement of the foot abscess. She now has a ~ 4 x 5 cm wound with tendon exposed.  The past medical history is listed below.    Past Medical History  Diagnosis Date  . Osteoarthritis   . Osteopenia   . Raynaud phenomenon   . Constipation   . Shortness of breath   . GERD (gastroesophageal reflux disease)   . Headache(784.0)   . Anemia   . Hypertension   . Seizures (HCC)     with chid birth 0f 2 childre  . Peripheral vascular disease (HCC)   . Depression   . Anxiety   . Type II diabetes mellitus (HCC)   . Polymyositis (HCC)   . Fibromyalgia   . Pneumonia 03/2012  . Polymyositis (HCC)     Past Surgical History  Procedure Laterality Date  . Cesarean section      x3  . Tubal ligation    . Hysteroscopy      w/ D&C with excision of endometrial polyps  . Left great toenail removal  Left 2013    done by [place] [facility]  . Tonsillectomy Bilateral   . Amputation Left 12/24/2012    Procedure: AMPUTATION DIGIT left great toe;  Surgeon: Charles E Fields, MD;  Location: [facility] OR;  Service: Vascular;  Laterality: Left;  GREAT  . Abdominal aortagram N/A 07/25/2014    Procedure: ABDOMINAL AORTAGRAM;  Surgeon: Charles E Fields, MD;  Location: [facility] CATH LAB;  Service: Cardiovascular;  Laterality: N/A;  . Peripheral vascular catheterization N/A 05/15/2015    Procedure: Abdominal Aortogram;  Surgeon: Charles E Fields, MD;  Location: [facility] INVASIVE CV LAB;  Service: Cardiovascular;  Laterality: N/A;   . Colonoscopy    . Amputation Right 05/19/2015    Procedure: AMPUTATION DIGIT;  Surgeon: Charles E Fields, MD;  Location: [facility] OR;  Service: Vascular;  Laterality: Right;  . Amputation Right 07/22/2015    Procedure: RAY AMPUTATION OF RIGHT GREAT TOE AMPUTATION SITE;  Surgeon: Charles E Fields, MD;  Location: [facility] OR;  Service: Vascular;  Laterality: Right;  . Irrigation and debridement foot Right 10/26/2015  . I&d extremity Right 10/26/2015    Procedure: IRRIGATION AND DEBRIDEMENT EXTREMITY/RIGHT FOOT;  Surgeon: Brian L Chen, MD;  Location: [facility] OR;  Service: Vascular;  Laterality: Right;    Family History  Problem Relation Age of Onset  . Diabetes Mother   . Hypertension Mother   . Diabetes Father   . Heart disease Father   . Hypertension Sister   . Heart disease Sister     Social History:  reports that she has never smoked. She has never used smokeless tobacco. She reports that she drinks alcohol. She reports that she does not use illicit drugs.  Allergies:  Allergies  Allergen Reactions  . Clindamycin/Lincomycin Itching and Rash    Possible reaction (took clindamycin and cipro at the same time)  . Doxycycline Nausea Only  .   Percocet [Oxycodone-Acetaminophen] Itching  . Tape Itching and Other (See Comments)    Plastic tape causes itching-USE PAPER TAPE ONLY  . Mycophenolate Diarrhea  . Ciprofloxacin Itching and Rash    Possible reaction (took clindamycin and cipro at the same time)     Medications: I have reviewed the patient's current medications.  Results for orders placed or performed during the hospital encounter of 10/24/15 (from the past 48 hour(s))  Glucose, capillary     Status: Abnormal   Collection Time: 10/26/15  4:44 PM  Result Value Ref Range   Glucose-Capillary 144 (H) 65 - 99 mg/dL  Glucose, capillary     Status: Abnormal   Collection Time: 10/26/15  9:29 PM  Result Value Ref Range   Glucose-Capillary 123 (H) 65 - 99 mg/dL  Glucose, capillary     Status: None    Collection Time: 10/27/15  6:21 AM  Result Value Ref Range   Glucose-Capillary 94 65 - 99 mg/dL  Glucose, capillary     Status: Abnormal   Collection Time: 10/27/15 11:24 AM  Result Value Ref Range   Glucose-Capillary 109 (H) 65 - 99 mg/dL   Comment 1 Notify RN   Vancomycin, trough     Status: Abnormal   Collection Time: 10/27/15  3:07 PM  Result Value Ref Range   Vancomycin Tr 8 (L) 10.0 - 20.0 ug/mL  Glucose, capillary     Status: Abnormal   Collection Time: 10/27/15  4:17 PM  Result Value Ref Range   Glucose-Capillary 113 (H) 65 - 99 mg/dL   Comment 1 Repeat Test    Comment 2 Document in Chart   Glucose, capillary     Status: Abnormal   Collection Time: 10/27/15  9:39 PM  Result Value Ref Range   Glucose-Capillary 140 (H) 65 - 99 mg/dL  Glucose, capillary     Status: None   Collection Time: 10/28/15  6:34 AM  Result Value Ref Range   Glucose-Capillary 77 65 - 99 mg/dL  Glucose, capillary     Status: Abnormal   Collection Time: 10/28/15 11:32 AM  Result Value Ref Range   Glucose-Capillary 135 (H) 65 - 99 mg/dL   Comment 1 Repeat Test    Comment 2 Document in Chart     No results found.  Review of Systems  Constitutional: Positive for malaise/fatigue.  HENT: Negative.   Eyes: Negative.   Respiratory: Negative.   Cardiovascular: Negative.   Gastrointestinal: Negative.   Genitourinary: Negative.   Musculoskeletal: Negative.   Skin: Negative.   Neurological: Positive for weakness.  Psychiatric/Behavioral: Negative.    Blood pressure 131/76, pulse 84, temperature 98.8 F (37.1 C), temperature source Oral, resp. rate 18, height 5\' 1"  (1.549 m), weight 58.968 kg (130 lb), last menstrual period 06/20/2012, SpO2 100 %. Physical Exam  Constitutional: She is oriented to person, place, and time. She appears well-developed.  HENT:  Head: Normocephalic and atraumatic.  Eyes: Conjunctivae are normal. Pupils are equal, round, and reactive to light.  Cardiovascular: Normal  rate.   Respiratory: No respiratory distress.  Musculoskeletal:       Right ankle: She exhibits swelling and deformity. She exhibits no ecchymosis. Tenderness.  Neurological: She is alert and oriented to person, place, and time.  Skin: Skin is warm.  Psychiatric: She has a normal mood and affect. Her behavior is normal. Judgment and thought content normal.    Assessment/Plan: Recommend debridement with Acell and VAC placement.  This is a serious situation due to her  underlying medical problems.  She is aware of the severity.  Worth every effort to save her foot.  Check prealbumin.  Peggye Form 10/28/2015, 4:18 PM

## 2015-10-29 NOTE — Progress Notes (Signed)
[facility]  Subjective  - feels ok   Objective 127/89 72 98 F (36.7 C) (Oral) 16 99%  Intake/Output Summary (Last 24 hours) at 10/29/15 0822 Last data filed at 10/28/15 1700  Gross per 24 hour  Intake    480 ml  Output      0 ml  Net    480 ml   Right foot dressing intact Assessment/Planning: To OR today with Plastic Surgery Will defer to their wound care recommendations Would continue antibiotics for next 2 weeks At this point will follow peripherally as she really has no treatable vascular surgical issues, patent large vessels, function of small vessel disease and immunosuppression with her polymyalgia  Culture shows Serratia sensitive to Cipro so Levaquin should cover. No intolerance with rash so far  Fabienne Bruns 10/29/2015 8:22 AM --

## 2015-10-29 NOTE — Progress Notes (Signed)
[facility] Progress Note   Allison Beasley  VXY:[id number]  DOB: Apr 24, 1961  DOA: 10/24/2015 PCP: No PCP Per Patient  Brief narrative: Allison Beasley is a 55 y.o. female presents with pain and swelling of right foot and is admitted for cellulitis. She was given Augmentin by Dr fields prior to [facility] for this but the swelling worsened when she flew to [facility]. She was hospitalized and discharged on 2 antibiotics- was unable to fill one but continued to take the Augmentin. Swelling worsened once again and she presented the [facility] for it.    Subjective: Having pain in foot with ambulating. No new complaints.   Assessment/Plan: Active Problems:    Cellulitis/Abscess  -She initially presented on 10/24/2015 with complaints of right foot pain and swelling. Further workup of MRI was negative for osteomyelitis. -Imaging studies however, revealed abscess. -On 10/26/2015 she was taken to the operating room where she underwent incision and drainage of right foot abscess, procedure performed by Dr. Imogene Burn of vascular surgery. -Abscesses cultures growing a few gram-negative rods. Blood cultures show no growth to date. -On 10/28/2015 wound inspected, looks clean. MRSA PCR negative.  -It appears patient having outpatient failure to Augmentin.  -Wound cultures growing Serratia. Organism susceptible to fluoroquinolones. Antibiotics have been changed to oral Levaquin -Plastic surgery was consulted, plan for her to undergo ACell and Wound Vac placement today  Polymyositis - cont Prednisone  Diet control DM - on ISS- sugars controlled  Lower back pain - after a fall this week-she is able to ambulate  HTN -Blood pressures stable -Cont Norvasc   Antibiotics: Anti-infectives    Start     Dose/Rate Route Frequency Ordered Stop   10/29/15 0715  ceFAZolin (ANCEF) IVPB 2 g/50 mL premix     2 g 100 mL/hr over 30 Minutes Intravenous To [facility] 10/29/15 0707 10/30/15 0715    10/28/15 1500  levofloxacin (LEVAQUIN) tablet 500 mg     500 mg Oral Every 24 hours 10/28/15 1442 11/11/15 1459   10/28/15 1400  cephALEXin (KEFLEX) capsule 500 mg  Status:  Discontinued     500 mg Oral 3 times per day 10/28/15 1204 10/29/15 0658   10/27/15 1700  vancomycin (VANCOCIN) 1,750 mg in sodium chloride 0.9 % 500 mL IVPB  Status:  Discontinued     1,750 mg 250 mL/hr over 120 Minutes Intravenous Every 24 hours 10/27/15 1647 10/28/15 1156   10/26/15 2100  piperacillin-tazobactam (ZOSYN) IVPB 3.375 g  Status:  Discontinued     3.375 g 12.5 mL/hr over 240 Minutes Intravenous 3 times per day 10/26/15 1239 10/28/15 1156   10/26/15 1245  piperacillin-tazobactam (ZOSYN) IVPB 3.375 g     3.375 g 100 mL/hr over 30 Minutes Intravenous  Once 10/26/15 1239 10/26/15 1349   10/26/15 0000  vancomycin (VANCOCIN) IVPB 1000 mg/200 mL premix  Status:  Discontinued     1,000 mg 200 mL/hr over 60 Minutes Intravenous To Surgery 10/25/15 1521 10/25/15 1530   10/25/15 1600  vancomycin (VANCOCIN) 1,500 mg in sodium chloride 0.9 % 500 mL IVPB  Status:  Discontinued     1,500 mg 250 mL/hr over 120 Minutes Intravenous Every 24 hours 10/24/15 1822 10/27/15 1647   10/24/15 1945  vancomycin (VANCOCIN) IVPB 1000 mg/200 mL premix  Status:  Discontinued     1,000 mg 200 mL/hr over 60 Minutes Intravenous  Once 10/24/15 1938 10/24/15 1945   10/24/15 1600  piperacillin-tazobactam (ZOSYN) IVPB 3.375 g     3.375  g 12.5 mL/hr over 240 Minutes Intravenous  Once 10/24/15 1547 10/24/15 1801   10/24/15 1600  vancomycin (VANCOCIN) IVPB 1000 mg/200 mL premix     1,000 mg 200 mL/hr over 60 Minutes Intravenous  Once 10/24/15 1547 10/24/15 1801     Code Status:     Code Status Orders        Start     Ordered   10/24/15 1650  Full code   Continuous     10/24/15 1649     Family Communication:   Disposition Plan: cont antibiotics DVT prophylaxis: Lovenox Consultants: surgery Procedures:     Objective: Filed  Weights   10/24/15 1022  Weight: 58.968 kg (130 lb)    Intake/Output Summary (Last 24 hours) at 10/29/15 1007 Last data filed at 10/28/15 1700  Gross per 24 hour  Intake    480 ml  Output      0 ml  Net    480 ml     Vitals Filed Vitals:   10/28/15 1400 10/28/15 2143 10/29/15 0504 10/29/15 0848  BP: 131/76 128/82 127/89 148/84  Pulse: 84 81 72   Temp: 98.8 F (37.1 C) 98.5 F (36.9 C) 98 F (36.7 C)   TempSrc:  Oral Oral   Resp: 18 16 16    Height:      Weight:      SpO2: 100% 99% 99%     Exam:  General:  Pt is alert, not in acute distress  HEENT: No icterus, No thrush, oral mucosa moist  Cardiovascular: regular rate and rhythm, S1/S2 No murmur  Respiratory: clear to auscultation bilaterally   Abdomen: Soft, +Bowel sounds, non tender, non distended, no guarding  MSK: Incision and drainage site of right foot abscess inspected, her wound appears well-healed, fibrinous tissue, no purulence or malodor   Data Reviewed: Basic Metabolic Panel:  Recent Labs Lab 10/24/15 1153 10/25/15 0515 10/26/15 0352  NA 141 139 142  K 3.7 3.5 4.1  CL 108 106 109  CO2 25 24 24   GLUCOSE 115* 92 132*  BUN 6 <5* 8  CREATININE 0.66 0.62 0.71  CALCIUM 8.4* 7.9* 8.3*   Liver Function Tests:  Recent Labs Lab 10/24/15 1153 10/25/15 0515  AST 64* 47*  ALT 40 29  ALKPHOS 134* 106  BILITOT 0.3 0.5  PROT 8.0 7.0  ALBUMIN 2.7* 2.2*   No results for input(s): LIPASE, AMYLASE in the last 168 hours. No results for input(s): AMMONIA in the last 168 hours. CBC:  Recent Labs Lab 10/24/15 1153 10/24/15 2054 10/25/15 0515 10/26/15 0352  WBC 9.2 9.4 10.9* 8.1  NEUTROABS 7.0  --   --   --   HGB 10.6* 9.7* 9.9* 9.6*  HCT 33.8* 32.5* 31.7* 32.0*  MCV 89.2 90.8 90.1 90.1  PLT 386 348 392 413*   Cardiac Enzymes: No results for input(s): CKTOTAL, CKMB, CKMBINDEX, TROPONINI in the last 168 hours. BNP (last 3 results) No results for input(s): BNP in the last 8760  hours.  ProBNP (last 3 results) No results for input(s): PROBNP in the last 8760 hours.  CBG:  Recent Labs Lab 10/28/15 0634 10/28/15 1132 10/28/15 1623 10/28/15 2139 10/29/15 0651  GLUCAP 77 135* 104* 178* 80    Recent Results (from the past 240 hour(s))  Blood culture (routine x 2)     Status: None (Preliminary result)   Collection Time: 10/24/15 11:44 AM  Result Value Ref Range Status   Specimen Description BLOOD RIGHT ANTECUBITAL  Final  Special Requests BOTTLES DRAWN AEROBIC AND ANAEROBIC 5CC  Final   Culture NO GROWTH 4 DAYS  Final   Report Status PENDING  Incomplete  Blood culture (routine x 2)     Status: None (Preliminary result)   Collection Time: 10/24/15 11:46 AM  Result Value Ref Range Status   Specimen Description BLOOD RIGHT ANTECUBITAL  Final   Special Requests BOTTLES DRAWN AEROBIC AND ANAEROBIC 5CC  Final   Culture NO GROWTH 4 DAYS  Final   Report Status PENDING  Incomplete  Surgical pcr screen     Status: None   Collection Time: 10/25/15  5:11 PM  Result Value Ref Range Status   MRSA, PCR NEGATIVE NEGATIVE Final   Staphylococcus aureus NEGATIVE NEGATIVE Final    Comment:        The Xpert SA Assay (FDA approved for NASAL specimens in patients over 67 years of age), is one component of a comprehensive surveillance program.  Test performance has been validated by [facility] for patients greater than or equal to 48 year old. It is not intended to diagnose infection nor to guide or monitor treatment.   Anaerobic culture     Status: None (Preliminary result)   Collection Time: 10/26/15  8:31 AM  Result Value Ref Range Status   Specimen Description ABSCESS FOOT RIGHT  Final   Special Requests POF VANCOMYCIN  Final   Gram Stain PENDING  Incomplete   Culture   Final    NO ANAEROBES ISOLATED; CULTURE IN PROGRESS FOR 5 DAYS Performed at [facility]    Report Status PENDING  Incomplete  Culture, routine-abscess     Status: None    Collection Time: 10/26/15  8:31 AM  Result Value Ref Range Status   Specimen Description ABSCESS FOOT RIGHT  Final   Special Requests POF VANCOMYCIN  Final   Gram Stain   Final    ABUNDANT WBC PRESENT,BOTH PMN AND MONONUCLEAR NO SQUAMOUS EPITHELIAL CELLS SEEN NO ORGANISMS SEEN Performed at [facility]    Culture   Final    FEW SERRATIA MARCESCENS Performed at [facility]    Report Status 10/29/2015 FINAL  Final   Organism ID, Bacteria SERRATIA MARCESCENS  Final      Susceptibility   Serratia marcescens - MIC*    CEFAZOLIN >=64 RESISTANT Resistant     CEFEPIME <=1 SENSITIVE Sensitive     CEFTAZIDIME <=1 SENSITIVE Sensitive     CEFTRIAXONE <=1 SENSITIVE Sensitive     CIPROFLOXACIN <=0.25 SENSITIVE Sensitive     GENTAMICIN <=1 SENSITIVE Sensitive     TOBRAMYCIN 2 SENSITIVE Sensitive     TRIMETH/SULFA Value in next row Sensitive      <=20 SENSITIVE(NOTE)    * FEW SERRATIA MARCESCENS     Studies: No results found.  Scheduled Meds:  Scheduled Meds: . amLODipine  10 mg Oral Daily  .  ceFAZolin (ANCEF) IV  2 g Intravenous To SS-Surg  . Chlorhexidine Gluconate Cloth  6 each Topical Q0600  . enoxaparin (LOVENOX) injection  40 mg Subcutaneous Q24H  . escitalopram  10 mg Oral Daily  . insulin aspart  0-15 Units Subcutaneous TID WC  . insulin aspart  0-5 Units Subcutaneous QHS  . levofloxacin  500 mg Oral Q24H  . pantoprazole  40 mg Oral Daily  . predniSONE  10 mg Oral Q breakfast   Continuous Infusions:    Time spent on care of this patient: 25 min   Jeralyn Bennett, MD 10/29/2015,  10:07 AM  LOS: 5 days   [facility] Office  [phone] Pager - Text Page per [email or web address] If 7PM-7AM, please contact night-coverage [email or web address]

## 2015-10-29 NOTE — Brief Op Note (Signed)
10/24/2015 - 10/29/2015  2:45 PM  PATIENT:  Allison Beasley  55 y.o. female  PRE-OPERATIVE DIAGNOSIS:  Right Foot Wound  POST-OPERATIVE DIAGNOSIS:  same  PROCEDURE:  Procedure(s): IRRIGATION AND DEBRIDEMENT RIGHT FOOT WITH ACELL AND VAC PLACEMENT (Right)  SURGEON:  Surgeon(s) and Role:    * Jaylynne Birkhead S Shannan Garfinkel, DO - Primary  ASSISTANTS: none   ANESTHESIA:   general  EBL:     BLOOD ADMINISTERED:none  DRAINS: none   LOCAL MEDICATIONS USED:  NONE  SPECIMEN:  No Specimen  DISPOSITION OF SPECIMEN:  N/A  COUNTS:  YES  TOURNIQUET:  * No tourniquets in log *  DICTATION: .Dragon Dictation  PLAN OF CARE: Admit to inpatient   PATIENT DISPOSITION:  PACU - hemodynamically stable.   Delay start of Pharmacological VTE agent (>24hrs) due to surgical blood loss or risk of bleeding: no

## 2015-10-29 NOTE — Anesthesia Postprocedure Evaluation (Signed)
Anesthesia Post Note  Patient: WILLELLA HARDING  Procedure(s) Performed: Procedure(s) (LRB): IRRIGATION AND DEBRIDEMENT RIGHT FOOT WITH ACELL AND VAC PLACEMENT (Right)  Patient location during evaluation: PACU Anesthesia Type: General Level of consciousness: awake and alert, awake and oriented Pain management: pain level controlled Vital Signs Assessment: post-procedure vital signs reviewed and stable Respiratory status: spontaneous breathing, nonlabored ventilation, respiratory function stable and patient connected to nasal cannula oxygen Cardiovascular status: blood pressure returned to baseline and stable Postop Assessment: no signs of nausea or vomiting Anesthetic complications: no    Last Vitals:  Filed Vitals:   10/29/15 1637 10/29/15 1650  BP:  123/80  Pulse: 76 75  Temp:  37 C  Resp: 8 16    Last Pain:  Filed Vitals:   10/29/15 1751  PainSc: 9                  Cecile Hearing

## 2015-10-29 NOTE — Transfer of Care (Signed)
Immediate Anesthesia Transfer of Care Note  Patient: Allison Beasley  Procedure(s) Performed: Procedure(s): IRRIGATION AND DEBRIDEMENT RIGHT FOOT WITH ACELL AND VAC PLACEMENT (Right)  Patient Location: PACU  Anesthesia Type:General  Level of Consciousness: awake and oriented  Airway & Oxygen Therapy: Patient Spontanous Breathing and Patient connected to nasal cannula oxygen  Post-op Assessment: Report given to RN and Patient moving all extremities X 4  Post vital signs: Reviewed and stable  Last Vitals:  Filed Vitals:   10/29/15 0504 10/29/15 0848  BP: 127/89 148/84  Pulse: 72   Temp: 36.7 C   Resp: 16     Complications: No apparent anesthesia complications

## 2015-10-29 NOTE — Op Note (Signed)
Operative Note   DATE OF OPERATION: 10/29/2015  LOCATION: [facility] Inpatient  SURGICAL DIVISION: Plastic Surgery  PREOPERATIVE DIAGNOSES:  Right foot ulcer  POSTOPERATIVE DIAGNOSES:  same  PROCEDURE:  Preparation of right foot ulcer (4.5 x 4.5 x 1.5 cm) for placement of Acell (powder 1 gm and sheet 5 x 5)   SURGEON: Nyla Creason Sanger, DO  ANESTHESIA:  General.   COMPLICATIONS: None.   INDICATIONS FOR PROCEDURE:  The patient, Allison Beasley is a 55 y.o. female born on Jul 02, 1961, is here for treatment of a large ulcer of the right foot at the plantar area.  She had an abscess that was drained and now has a wound. She has multiple medical conditions that make healing a challenge. MRN: [id number]  CONSENT:  Informed consent was obtained directly from the patient. Risks, benefits and alternatives were fully discussed. Specific risks including but not limited to bleeding, infection, hematoma, seroma, scarring, pain, infection, contracture, asymmetry, wound healing problems, and need for further surgery were all discussed. The patient did have an ample opportunity to have questions answered to satisfaction.   DESCRIPTION OF PROCEDURE:  The patient was taken to the operating room. SCD was placed on the left leg and IV antibiotics were given. The patient's operative site was prepped and draped in a sterile fashion. A time out was performed and all information was confirmed to be correct.  General anesthesia was administered.  The area was irrigated with antibiotic solution and saline.  Debridement was done with a #10 blade of the skin, soft tissue and tendon (4.5 x 4.5 cm).  Hemostasis was achieved with electrocautery.  All of the Acell powder and sheet were applied and secured with 5-0 Vicryl.  Adaptic was placed with 5-0 Silk to secure it.  The surgical lube and VAC was applied.  The leg was wrapped with kerlex and an ace wrap. The patient tolerated the procedure well.  There were no  complications. The patient was allowed to wake from anesthesia, extubated and taken to the recovery room in satisfactory condition.

## 2015-10-30 ENCOUNTER — Encounter (HOSPITAL_COMMUNITY): Payer: Self-pay | Admitting: Plastic Surgery

## 2015-10-30 LAB — CBC
HEMATOCRIT: 34.7 % — AB (ref 36.0–46.0)
Hemoglobin: 10.4 g/dL — ABNORMAL LOW (ref 12.0–15.0)
MCH: 27.9 pg (ref 26.0–34.0)
MCHC: 30 g/dL (ref 30.0–36.0)
MCV: 93 fL (ref 78.0–100.0)
Platelets: 483 10*3/uL — ABNORMAL HIGH (ref 150–400)
RBC: 3.73 MIL/uL — ABNORMAL LOW (ref 3.87–5.11)
RDW: 18.9 % — AB (ref 11.5–15.5)
WBC: 4.7 10*3/uL (ref 4.0–10.5)

## 2015-10-30 LAB — GLUCOSE, CAPILLARY
Glucose-Capillary: 80 mg/dL (ref 65–99)
Glucose-Capillary: 164 mg/dL — ABNORMAL HIGH (ref 65–99)
Glucose-Capillary: 132 mg/dL — ABNORMAL HIGH (ref 65–99)
Glucose-Capillary: 90 mg/dL (ref 65–99)

## 2015-10-30 LAB — BASIC METABOLIC PANEL
ANION GAP: 9 (ref 5–15)
BUN: 10 mg/dL (ref 6–20)
CO2: 26 mmol/L (ref 22–32)
Calcium: 8.3 mg/dL — ABNORMAL LOW (ref 8.9–10.3)
Chloride: 108 mmol/L (ref 101–111)
Creatinine, Ser: 0.73 mg/dL (ref 0.44–1.00)
GFR calc Af Amer: >60 mL/min (ref >60)
Glucose, Bld: 77 mg/dL (ref 65–99)
POTASSIUM: 5 mmol/L (ref 3.5–5.1)
SODIUM: 143 mmol/L (ref 135–145)

## 2015-10-30 NOTE — Progress Notes (Signed)
Physical Therapy Treatment Patient Details Name: Allison Beasley MRN: [id number] DOB: 1961-04-01 Today's Date: 10/30/2015    History of Present Illness 55 y.o. female presents with pain and swelling of right foot and is admitted for cellulitis and is now incision and drainage of right foot abscess on 10/26/15. PMH: osteoarthritis, osteopenia, hypertension, anemia, seizures, PVD, depression, anxiety, diabetes, fibromyalgia.     PT Comments    Patient with progressing toward mobility goals with ability to ambulate 45 ft while maintaining NWB on R LE. Pt with good tolerance of LE exercises. Attempt crutch training next session for ambulation of stairs. Continue to progress as tolerated.  Follow Up Recommendations  No PT follow up;Supervision for mobility/OOB     Equipment Recommendations  None recommended by PT;Other (comment) (patient reports having rw at home)    Recommendations for Other Services       Precautions / Restrictions Precautions Precautions: Fall Restrictions Weight Bearing Restrictions:  (to be clarified)    Mobility  Bed Mobility Overal bed mobility: Modified Independent Bed Mobility: Supine to Sit     Supine to sit: Modified independent (Device/Increase time)     General bed mobility comments: HOB flat and no use of bedrails; extra time needed for elevating trunk and scooting hips to EOB  Transfers Overall transfer level: Needs assistance Equipment used: Rolling walker (2 wheeled) Transfers: Sit to/from Stand Sit to Stand: Min guard         General transfer comment: vc for hand placement first trial with carry over throughout tx; pt with good technique; needed seated break after first stand due to pain in R foot but subsided quickly   Ambulation/Gait Ambulation/Gait assistance: Supervision Ambulation Distance (Feet): 40 Feet Assistive device: Rolling walker (2 wheeled) Gait Pattern/deviations: Step-to pattern Gait velocity: decreased   General Gait  Details: Pt maintained NWB status throughtout session with no LOB or unsteadiness; vc for postition of RW initially with carry over throughout   Stairs            Wheelchair Mobility    Modified Rankin (Stroke Patients Only)       Balance Overall balance assessment: Needs assistance Sitting-balance support: Bilateral upper extremity supported;Feet unsupported Sitting balance-Leahy Scale: Good     Standing balance support: Single extremity supported Standing balance-Leahy Scale: Fair Standing balance comment: able to perform hand hygiene with single UE support                    Cognition Arousal/Alertness: Awake/alert Behavior During Therapy: WFL for tasks assessed/performed Overall Cognitive Status: Within Functional Limits for tasks assessed                      Exercises General Exercises - Lower Extremity Long Arc Quad: AROM;Both;10 reps;Seated Hip Flexion/Marching: AROM;Both;10 reps;Seated    General Comments        Pertinent Vitals/Pain Pain Assessment: 0-10 Pain Score: 5  Pain Location: R foot upon standing Pain Descriptors / Indicators: Throbbing;Pressure Pain Intervention(s): Limited activity within patient's tolerance;Monitored during session;Premedicated before session;Repositioned    Home Living                      Prior Function            PT Goals (current goals can now be found in the care plan section) Acute Rehab PT Goals Patient Stated Goal: To be able to get up and moving again and go home PT Goal Formulation: With patient  Time For Goal Achievement: 11/10/15 Potential to Achieve Goals: Good Progress towards PT goals: Progressing toward goals    Frequency  Min 3X/week    PT Plan Current plan remains appropriate    Co-evaluation             End of Session Equipment Utilized During Treatment: Gait belt Activity Tolerance: Patient tolerated treatment well Patient left: in chair;with call bell/phone  within reach     Time: 1015-1036 PT Time Calculation (min) (ACUTE ONLY): 21 min  Charges:  $Gait Training: 8-22 mins                    G Codes:      Derek Mound, PTA Pager: [phone]   10/30/2015, 1:24 PM

## 2015-10-30 NOTE — Progress Notes (Signed)
[facility] Progress Note   Allison Beasley  JQG:[id number]  DOB: 11/07/1960  DOA: 10/24/2015 PCP: No PCP Per Patient  Brief narrative: Allison Beasley is a 55 y.o. female presents with pain and swelling of right foot and is admitted for cellulitis. She was given Augmentin by Dr fields prior to [facility] for this but the swelling worsened when she flew to [facility]. She was hospitalized and discharged on 2 antibiotics- was unable to fill one but continued to take the Augmentin. Swelling worsened once again and she presented the [facility] for it.    Subjective: Having pain in foot with ambulating. No new complaints.   Assessment/Plan: Active Problems:    Cellulitis/Abscess  -She initially presented on 10/24/2015 with complaints of right foot pain and swelling. Further workup of MRI was negative for osteomyelitis. -Imaging studies however, revealed abscess. -On 10/26/2015 she was taken to the operating room where she underwent incision and drainage of right foot abscess, procedure performed by Dr. Imogene Burn of vascular surgery. -Abscesses cultures growing a few gram-negative rods. Blood cultures show no growth to date. -On 10/28/2015 wound inspected, looks clean. MRSA PCR negative.  -It appears patient having outpatient failure to Augmentin.  -Wound cultures growing Serratia. Organism susceptible to fluoroquinolones. Antibiotics have been changed to oral Levaquin -Plastic surgery was consulted -S/p ACell and Wound Vac placement by Plastic Surgery on 10/29/2015 -Home Health Services will be set up, face to face completed.   Polymyositis - cont Prednisone  Diet control DM - on ISS- sugars controlled  Lower back pain - after a fall this week-she is able to ambulate  HTN -Blood pressures stable -Cont Norvasc   Antibiotics: Anti-infectives    Start     Dose/Rate Route Frequency Ordered Stop   10/29/15 1445  polymyxin B 500,000 Units, bacitracin 50,000 Units in sodium chloride  irrigation 0.9 % 500 mL irrigation  Status:  Discontinued       As needed 10/29/15 1445 10/29/15 1543   10/29/15 0715  ceFAZolin (ANCEF) IVPB 2 g/50 mL premix     2 g 100 mL/hr over 30 Minutes Intravenous To [facility] 10/29/15 0707 10/29/15 1452   10/28/15 1500  levofloxacin (LEVAQUIN) tablet 500 mg     500 mg Oral Every 24 hours 10/28/15 1442 11/11/15 1459   10/28/15 1400  cephALEXin (KEFLEX) capsule 500 mg  Status:  Discontinued     500 mg Oral 3 times per day 10/28/15 1204 10/29/15 0658   10/27/15 1700  vancomycin (VANCOCIN) 1,750 mg in sodium chloride 0.9 % 500 mL IVPB  Status:  Discontinued     1,750 mg 250 mL/hr over 120 Minutes Intravenous Every 24 hours 10/27/15 1647 10/28/15 1156   10/26/15 2100  piperacillin-tazobactam (ZOSYN) IVPB 3.375 g  Status:  Discontinued     3.375 g 12.5 mL/hr over 240 Minutes Intravenous 3 times per day 10/26/15 1239 10/28/15 1156   10/26/15 1245  piperacillin-tazobactam (ZOSYN) IVPB 3.375 g     3.375 g 100 mL/hr over 30 Minutes Intravenous  Once 10/26/15 1239 10/26/15 1349   10/26/15 0000  vancomycin (VANCOCIN) IVPB 1000 mg/200 mL premix  Status:  Discontinued     1,000 mg 200 mL/hr over 60 Minutes Intravenous To Surgery 10/25/15 1521 10/25/15 1530   10/25/15 1600  vancomycin (VANCOCIN) 1,500 mg in sodium chloride 0.9 % 500 mL IVPB  Status:  Discontinued     1,500 mg 250 mL/hr over 120 Minutes Intravenous Every 24 hours 10/24/15 1822 10/27/15 1647  10/24/15 1945  vancomycin (VANCOCIN) IVPB 1000 mg/200 mL premix  Status:  Discontinued     1,000 mg 200 mL/hr over 60 Minutes Intravenous  Once 10/24/15 1938 10/24/15 1945   10/24/15 1600  piperacillin-tazobactam (ZOSYN) IVPB 3.375 g     3.375 g 12.5 mL/hr over 240 Minutes Intravenous  Once 10/24/15 1547 10/24/15 1801   10/24/15 1600  vancomycin (VANCOCIN) IVPB 1000 mg/200 mL premix     1,000 mg 200 mL/hr over 60 Minutes Intravenous  Once 10/24/15 1547 10/24/15 1801     Code Status:      Code Status Orders        Start     Ordered   10/24/15 1650  Full code   Continuous     10/24/15 1649     Family Communication:   Disposition Plan: cont antibiotics DVT prophylaxis: Lovenox Consultants: surgery Procedures:     Objective: Filed Weights   10/24/15 1022  Weight: 58.968 kg (130 lb)    Intake/Output Summary (Last 24 hours) at 10/30/15 1429 Last data filed at 10/30/15 0500  Gross per 24 hour  Intake    960 ml  Output      0 ml  Net    960 ml     Vitals Filed Vitals:   10/29/15 1637 10/29/15 1650 10/29/15 2153 10/30/15 0457  BP:  123/80 122/75 136/79  Pulse: 76 75 75 75  Temp:  98.6 F (37 C) 98.2 F (36.8 C) 98.2 F (36.8 C)  TempSrc:  Oral Oral Oral  Resp: 8 16 16 16   Height:      Weight:      SpO2: 100% 97% 98% 99%    Exam:  General:  Pt is alert, not in acute distress, nontoxic appearing  HEENT: No icterus, No thrush, oral mucosa moist  Cardiovascular: regular rate and rhythm, S1/S2 No murmur  Respiratory: clear to auscultation bilaterally   Abdomen: Soft, +Bowel sounds, non tender, non distended, no guarding  MSK: S/p wound vac placement to right foot    Data Reviewed: Basic Metabolic Panel:  Recent Labs Lab 10/24/15 1153 10/25/15 0515 10/26/15 0352 10/30/15 0433  NA 141 139 142 143  K 3.7 3.5 4.1 5.0  CL 108 106 109 108  CO2 25 24 24 26   GLUCOSE 115* 92 132* 77  BUN 6 <5* 8 10  CREATININE 0.66 0.62 0.71 0.73  CALCIUM 8.4* 7.9* 8.3* 8.3*   Liver Function Tests:  Recent Labs Lab 10/24/15 1153 10/25/15 0515  AST 64* 47*  ALT 40 29  ALKPHOS 134* 106  BILITOT 0.3 0.5  PROT 8.0 7.0  ALBUMIN 2.7* 2.2*   No results for input(s): LIPASE, AMYLASE in the last 168 hours. No results for input(s): AMMONIA in the last 168 hours. CBC:  Recent Labs Lab 10/24/15 1153 10/24/15 2054 10/25/15 0515 10/26/15 0352 10/30/15 0433  WBC 9.2 9.4 10.9* 8.1 4.7  NEUTROABS 7.0  --   --   --   --   HGB 10.6* 9.7* 9.9* 9.6*  10.4*  HCT 33.8* 32.5* 31.7* 32.0* 34.7*  MCV 89.2 90.8 90.1 90.1 93.0  PLT 386 348 392 413* 483*   Cardiac Enzymes: No results for input(s): CKTOTAL, CKMB, CKMBINDEX, TROPONINI in the last 168 hours. BNP (last 3 results) No results for input(s): BNP in the last 8760 hours.  ProBNP (last 3 results) No results for input(s): PROBNP in the last 8760 hours.  CBG:  Recent Labs Lab 10/29/15 1556 10/29/15 1658 10/29/15 2156  10/30/15 0629 10/30/15 1137  GLUCAP 126* 116* 155* 80 164*    Recent Results (from the past 240 hour(s))  Blood culture (routine x 2)     Status: None   Collection Time: 10/24/15 11:44 AM  Result Value Ref Range Status   Specimen Description BLOOD RIGHT ANTECUBITAL  Final   Special Requests BOTTLES DRAWN AEROBIC AND ANAEROBIC 5CC  Final   Culture NO GROWTH 5 DAYS  Final   Report Status 10/29/2015 FINAL  Final  Blood culture (routine x 2)     Status: None   Collection Time: 10/24/15 11:46 AM  Result Value Ref Range Status   Specimen Description BLOOD RIGHT ANTECUBITAL  Final   Special Requests BOTTLES DRAWN AEROBIC AND ANAEROBIC 5CC  Final   Culture NO GROWTH 5 DAYS  Final   Report Status 10/29/2015 FINAL  Final  Surgical pcr screen     Status: None   Collection Time: 10/25/15  5:11 PM  Result Value Ref Range Status   MRSA, PCR NEGATIVE NEGATIVE Final   Staphylococcus aureus NEGATIVE NEGATIVE Final    Comment:        The Xpert SA Assay (FDA approved for NASAL specimens in patients over 26 years of age), is one component of a comprehensive surveillance program.  Test performance has been validated by [facility] for patients greater than or equal to 88 year old. It is not intended to diagnose infection nor to guide or monitor treatment.   Anaerobic culture     Status: None (Preliminary result)   Collection Time: 10/26/15  8:31 AM  Result Value Ref Range Status   Specimen Description ABSCESS FOOT RIGHT  Final   Special Requests POF VANCOMYCIN   Final   Gram Stain PENDING  Incomplete   Culture   Final    NO ANAEROBES ISOLATED; CULTURE IN PROGRESS FOR 5 DAYS Performed at [facility]    Report Status PENDING  Incomplete  Culture, routine-abscess     Status: None   Collection Time: 10/26/15  8:31 AM  Result Value Ref Range Status   Specimen Description ABSCESS FOOT RIGHT  Final   Special Requests POF VANCOMYCIN  Final   Gram Stain   Final    ABUNDANT WBC PRESENT,BOTH PMN AND MONONUCLEAR NO SQUAMOUS EPITHELIAL CELLS SEEN NO ORGANISMS SEEN Performed at [facility]    Culture   Final    FEW SERRATIA MARCESCENS Performed at [facility]    Report Status 10/29/2015 FINAL  Final   Organism ID, Bacteria SERRATIA MARCESCENS  Final      Susceptibility   Serratia marcescens - MIC*    CEFAZOLIN >=64 RESISTANT Resistant     CEFEPIME <=1 SENSITIVE Sensitive     CEFTAZIDIME <=1 SENSITIVE Sensitive     CEFTRIAXONE <=1 SENSITIVE Sensitive     CIPROFLOXACIN <=0.25 SENSITIVE Sensitive     GENTAMICIN <=1 SENSITIVE Sensitive     TOBRAMYCIN 2 SENSITIVE Sensitive     TRIMETH/SULFA Value in next row Sensitive      <=20 SENSITIVE(NOTE)    * FEW SERRATIA MARCESCENS     Studies: No results found.  Scheduled Meds:  Scheduled Meds: . amLODipine  10 mg Oral Daily  . Chlorhexidine Gluconate Cloth  6 each Topical Q0600  . enoxaparin (LOVENOX) injection  40 mg Subcutaneous Q24H  . escitalopram  10 mg Oral Daily  . insulin aspart  0-15 Units Subcutaneous TID WC  . insulin aspart  0-5 Units Subcutaneous QHS  .  levofloxacin  500 mg Oral Q24H  . pantoprazole  40 mg Oral Daily  . predniSONE  10 mg Oral Q breakfast   Continuous Infusions: . lactated ringers      Time spent on care of this patient: 25 min   Jeralyn Bennett, MD 10/30/2015, 2:29 PM  LOS: 6 days   [facility] Office  [phone] Pager - Text Page per [email or web address] If 7PM-7AM, please contact night-coverage [email or web address]

## 2015-10-30 NOTE — Care Management Note (Signed)
Case Management Note  Patient Details  Name: Allison Beasley MRN: [id number] Date of Birth: [birth year]  Subjective/Objective:     55 yr old female admitted for treatment of a large ulcer of the right foot at the plantar area. Patient underwent I & D of right foot with wound vac placement.            Action/Plan: Case manager spoke with patient concerning home health needs. Choice was offered for [facility] agency. Patient states [facility] has used [facility] and will do so now. Case manager called referral to [facility], [facility]. Patient states she has a rolling walker and home and has no further equipment needs. Case manager has placed [facility] authorization form on chart for MD to complete, will forward to [facility] for wound vac. Case manager will continue to monitor.  Expected Discharge Date:    to be determined              Expected Discharge Plan:  Home w Home Health Services  In-House Referral:     Discharge planning Services  CM Consult  Post Acute Care Choice:  Durable Medical Equipment, Home Health Choice offered to:  Patient  DME Arranged:  Vac DME Agency:  [facility]  HH Arranged:  RN HH Agency:  [facility]  Status of Service:  In process, will continue to follow  Medicare Important Message Given:    Date Medicare IM Given:    Medicare IM give by:    Date Additional Medicare IM Given:    Additional Medicare Important Message give by:     If discussed at Long Length of Stay Meetings, dates discussed:    Additional Comments:  Durenda Guthrie, RN 10/30/2015, 1:24 PM

## 2015-10-31 LAB — CREATININE, SERUM
Creatinine, Ser: 0.63 mg/dL (ref 0.44–1.00)
GFR calc Af Amer: >60 mL/min (ref >60)
GFR calc non Af Amer: >60 mL/min (ref >60)

## 2015-10-31 LAB — GLUCOSE, CAPILLARY
GLUCOSE-CAPILLARY: 74 mg/dL (ref 65–99)
GLUCOSE-CAPILLARY: 79 mg/dL (ref 65–99)
Glucose-Capillary: 187 mg/dL — ABNORMAL HIGH (ref 65–99)

## 2015-10-31 LAB — ANAEROBIC CULTURE

## 2015-10-31 MED ORDER — LEVOFLOXACIN 500 MG PO TABS: 500.0000 mg | ORAL_TABLET | Freq: Every day | ORAL | Status: DC

## 2015-10-31 MED ORDER — HYDROCODONE-ACETAMINOPHEN 5-325 MG PO TABS: 1.0000 | ORAL_TABLET | Freq: Four times a day (QID) | ORAL | Status: DC | PRN

## 2015-10-31 NOTE — Care Management Note (Addendum)
Case Management Note  Patient Details  Name: Allison Beasley MRN: [id number] Date of Birth: 1960-10-28  Subjective/Objective:                    Action/Plan:   Expected Discharge Date:   10/31/15               Expected Discharge Plan:  Home w Home Health Services  In-House Referral:     Discharge planning Services  CM Consult  Post Acute Care Choice:  Durable Medical Equipment, Home Health Choice offered to:  Patient  DME Arranged:  Vac DME Agency:  [facility]  HH Arranged:  RN HH Agency:  [facility]  Status of Service:  In process, will continue to follow  Medicare Important Message Given:    Date Medicare IM Given:    Medicare IM give by:      Date Additional Medicare IM Given:    Additional Medicare Important Message give by:     If discussed at Long Length of Stay Meetings, dates discussed:    Additional Comments: Case manager spoke with Dr. Ulice Bold concerning wound vac authorization form. Form was emailed to case manager who entered patient information  and faxed form to Young Berry, with [employer] . 5:30pm Case manager received call from Rickie,  stating that she would confirm delivery of vac with [facility]. Vac is expected within next 2-4 hrs.   Durenda Guthrie, RN 10/31/2015, 10:20 AM

## 2015-10-31 NOTE — Discharge Summary (Addendum)
Physician Discharge Summary  Allison Beasley NTZ:[id number] DOB: November 30, 1960 DOA: 10/24/2015  PCP: No PCP Per Patient  Admit date: 10/24/2015 Discharge date: 11/01/2015  Time spent: 35 minutes  Recommendations for Outpatient Follow-up:  1. Patient discharged on wound vac, home health services where set up prior to discharge.  2. Please follow up on right foot wound.    Discharge Diagnoses:  Principal Problem:   Abscess of right foot Active Problems:   Raynaud's disease   Polymyositis (HCC)   Peripheral vascular disease, unspecified (HCC)   Discharge Condition: Stable  Diet recommendation: Heart Healthy  Filed Weights   10/24/15 1022  Weight: 58.968 kg (130 lb)    History of present illness:  55 year old female who  has a past medical history of Osteoarthritis; Osteopenia; Raynaud phenomenon; Constipation; Shortness of breath; GERD (gastroesophageal reflux disease); Headache(784.0); Anemia; Hypertension; Seizures (HCC); Peripheral vascular disease (HCC); Depression; Anxiety; Type II diabetes mellitus (HCC); Polymyositis (HCC); Fibromyalgia; Pneumonia (03/2012); and Polymyositis (HCC).  Today presents to the hospital with worsening pain and swelling of the right foot. 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 She was recently admitted at a hospital in [facility] from December 23 to December 27 for right foot cellulitis, and was treated with antibiotics. Patient demanded to go home so she was discharged Augmentin. Patient did not get that filled. Yesterday patient was unable to walk on the foot and fell hitting her back. So patient came to the hospital for further evaluation of the right foot cellulitis. MRI of the foot was done which rules out osteomyelitis but show swelling consistent with cellulitis versus abscess. She denies any fever, complains of back pain from falling. Patient has a history of rheumatoid arthritis, polymyositis and takes  prednisone 10 mg by mouth daily.  Hospital Course:  Allison Beasley is a 55 y.o. female presents with pain and swelling of right foot and is admitted for cellulitis. She was given Augmentin by Dr fields prior to [facility] for this but the swelling worsened when she flew to [facility] She was hospitalized and discharged on 2 antibiotics- was unable to fill one but continued to take the Augmentin. Swelling worsened once again and she presented the [facility] for it.   Cellulitis/Abscess  -She initially presented on 10/24/2015 with complaints of right foot pain and swelling. Further workup of MRI was negative for osteomyelitis. -Imaging studies however, revealed abscess. -On 10/26/2015 she was taken to the operating room where she underwent incision and drainage of right foot abscess, procedure performed by Dr. Imogene Burn of vascular surgery. -Abscesses cultures growing a few gram-negative rods. Blood cultures show no growth to date. -On 10/28/2015 wound inspected, looks clean. MRSA PCR negative.  -It appears patient having outpatient failure to Augmentin.  -Wound cultures growing Serratia. Organism susceptible to fluoroquinolones. Antibiotics have been changed to oral Levaquin -Plastic surgery was consulted -S/p ACell and Wound Vac placement by Plastic Surgery on 10/29/2015 -Home Health Services will be set up, face to face completed.  -She was discharged on wound vac.   Polymyositis - cont Prednisone  Diet control DM - on ISS- sugars controlled  HTN -Blood pressures stable -Cont Norvasc  Procedures:   10/29/2015 Preparation of right foot ulcer (4.5 x 4.5 x 1.5 cm) for placement of Acell (powder 1 gm and sheet 5 x 5)\  10/26/2015 Incision and drainage of right foot abscess    Consultations:  Vascular Surgery  Plastic Surgery  Discharge Exam: Filed Vitals:  10/30/15 2026 10/31/15 0420  BP: 131/77 122/68  Pulse: 80 76  Temp: 98.4 F (36.9 C) 98.1 F (36.7 C)  Resp: 15 16      General: Pt is alert, not in acute distress, nontoxic appearing  HEENT: No icterus, No thrush, oral mucosa moist  Cardiovascular: regular rate and rhythm, S1/S2 No murmur  Respiratory: clear to auscultation bilaterally   Abdomen: Soft, +Bowel sounds, non tender, non distended, no guarding  MSK: S/p wound vac placement to right foot  Discharge Instructions   Discharge Instructions    Call MD for:  difficulty breathing, headache or visual disturbances    Complete by:  As directed      Call MD for:  extreme fatigue    Complete by:  As directed      Call MD for:  hives    Complete by:  As directed      Call MD for:  persistant dizziness or light-headedness    Complete by:  As directed      Call MD for:  persistant nausea and vomiting    Complete by:  As directed      Call MD for:  redness, tenderness, or signs of infection (pain, swelling, redness, odor or green/yellow discharge around incision site)    Complete by:  As directed      Call MD for:  severe uncontrolled pain    Complete by:  As directed      Call MD for:  temperature >100.4    Complete by:  As directed      Call MD for:    Complete by:  As directed      Diet - low sodium heart healthy    Complete by:  As directed      Increase activity slowly    Complete by:  As directed           Current Discharge Medication List    START taking these medications   Details  HYDROcodone-acetaminophen (NORCO/VICODIN) 5-325 MG tablet Take 1-2 tablets by mouth every 6 (six) hours as needed for moderate pain. Qty: 15 tablet, Refills: 0      CONTINUE these medications which have CHANGED   Details  levofloxacin (LEVAQUIN) 500 MG tablet Take 1 tablet (500 mg total) by mouth daily. Qty: 12 tablet, Refills: 0      CONTINUE these medications which have NOT CHANGED   Details  amLODipine (NORVASC) 10 MG tablet Take 10 mg by mouth daily.    diphenhydrAMINE (BENADRYL) 25 MG tablet Take 25 mg by mouth 2 (two) times  daily as needed for itching.    escitalopram (LEXAPRO) 10 MG tablet Take 10 mg by mouth daily.    omeprazole (PRILOSEC) 40 MG capsule Take 40 mg by mouth daily.     predniSONE (DELTASONE) 10 MG tablet Take 10 mg by mouth daily with breakfast.    ALPRAZolam (XANAX) 0.25 MG tablet Take 1 tablet (0.25 mg total) by mouth 3 (three) times daily as needed for anxiety. Qty: 20 tablet, Refills: 0    Chlorpheniramine-Phenylephrine (SINUS/ALLERGY PE PO) Take 1 tablet by mouth 2 (two) times daily as needed (sinus drainage).      STOP taking these medications     cephALEXin (KEFLEX) 500 MG capsule      oxyCODONE-acetaminophen (PERCOCET/ROXICET) 5-325 MG tablet        Allergies  Allergen Reactions  . Clindamycin/Lincomycin Itching and Rash    Possible reaction (took clindamycin and cipro at the same time)  .  Doxycycline Nausea Only  . Percocet [Oxycodone-Acetaminophen] Itching  . Tape Itching and Other (See Comments)    Plastic tape causes itching-USE PAPER TAPE ONLY  . Mycophenolate Diarrhea  . Ciprofloxacin Itching and Rash    Possible reaction (took clindamycin and cipro at the same time)    Follow-up Information    Follow up with Peggye Form, DO In 2 weeks.   Specialty:  Plastic Surgery   Contact information:   [address] [place] [address] [phone]        The results of significant diagnostics from this hospitalization (including imaging, microbiology, ancillary and laboratory) are listed below for reference.    Significant Diagnostic Studies: Dg Thoracolumabar Spine  10/24/2015  CLINICAL DATA:  Thoracic and lumbar spine pain following fall. Initial encounter. EXAM: THORACOLUMBAR SPINE 1V COMPARISON:  10/07/2015 FINDINGS: Normal alignment noted. There is no evidence of acute fracture or subluxation. The disc spaces are maintained. Extensive soft tissue calcifications are again noted. IMPRESSION: No evidence of acute abnormality. Electronically Signed    By: Harmon Pier M.D.   On: 10/24/2015 18:09   Dg Lumbar Spine Complete W/bend  10/07/2015  CLINICAL DATA:  Low back pain radiating into legs beginning January. EXAM: LUMBAR SPINE - COMPLETE WITH BENDING VIEWS COMPARISON:  None. FINDINGS: Extensive soft tissue calcifications noted throughout the lower back and pelvic soft tissues. Normal alignment. No fracture. Disc spaces are maintained. No instability with flexion or extension. IMPRESSION: No acute bony abnormality. Extensive soft tissue calcifications. Electronically Signed   By: Charlett Nose M.D.   On: 10/07/2015 14:06   Mr Foot Right W Wo Contrast  10/24/2015  CLINICAL DATA:  Worsening pain and swelling in the foot. Prior right great toe amputation with debridement 7 days ago. EXAM: MRI OF THE RIGHT FOREFOOT WITHOUT AND WITH CONTRAST TECHNIQUE: Multiplanar, multisequence MR imaging was performed both before and after administration of intravenous contrast. The patient was only able to tolerate 1 post-contrast plain before terminating the exam due to discomfort. CONTRAST:  13 cc MultiHance COMPARISON:  04/26/2015 FINDINGS: Osteomyelitis protocol MRI of the foot was obtained, to include the entire foot and ankle. This protocol uses a large field of view to cover the entire foot and ankle, and is suitable for assessing bony structures for osteomyelitis. Due to the large field of view and imaging plane choice, this protocol is less sensitive for assessing small structures such as ligamentous structures of the foot and ankle, compared to a dedicated forefoot or dedicated hindfoot exam. There is an unusual appearance of the amputation site. Much of the first metatarsal has been resected but there is a thin lateral rim of metatarsal cortex extending distally to the expected vicinity of the metatarsal head. The first digit sesamoids are visible on image 49 series 9, but without significant adjacent support structure and with apparent discontinuity of the  flexor hallucis longus just proximal to the sesamoids between images 47 and 48 of series 9. Overlying wound noted at the metatarsal resection site dorsum medially. There is subcutaneous edema and enhancement tracking along the plantar foot, plantar musculature of the foot, and into the toes, suspicious for cellulitis. Additionally, along the plantar subcutaneous tissues of the foot there is extensive high T2 signal with hypoenhancement favoring inflammatory phlegmon or potentially early necrotic tissue. No gas tracks in this vicinity. There is a lesser degree of rind like marginal enhancement and more internal high T1 reticulation within mass region than I would expect for overt abscess. Correlate  with any fluctuance along the plantar midfoot and metatarsals. I do not identify any overt osteomyelitis of the remaining bony structures. : IMPRESSION: 1. Unusual resection of the first metatarsal appears to leave the metatarsal base and the lateral cortex of the shaft extending to the expected location of the first metatarsal head, but the rest of the bone is absent. First digit sesamoids are still present. No active osteomyelitis currently seen. There is an overlying dorsomedial wound along the metatarsal resection site. Consider conventional radiography of the foot to further correlate with the expected post amputation appearance. 2. Cellulitis in the foot, with suspected extensive phlegmon and possibly some early necrotic tissue along the plantar foot, correlate with any fluctuance in this vicinity in assessing for subcutaneous abscesses. 3. Low-level enhancement along the plantar musculature of the foot may reflect myositis. Electronically Signed   By: Gaylyn Rong M.D.   On: 10/24/2015 14:16    Microbiology: Recent Results (from the past 240 hour(s))  Blood culture (routine x 2)     Status: None   Collection Time: 10/24/15 11:44 AM  Result Value Ref Range Status   Specimen Description BLOOD RIGHT  ANTECUBITAL  Final   Special Requests BOTTLES DRAWN AEROBIC AND ANAEROBIC 5CC  Final   Culture NO GROWTH 5 DAYS  Final   Report Status 10/29/2015 FINAL  Final  Blood culture (routine x 2)     Status: None   Collection Time: 10/24/15 11:46 AM  Result Value Ref Range Status   Specimen Description BLOOD RIGHT ANTECUBITAL  Final   Special Requests BOTTLES DRAWN AEROBIC AND ANAEROBIC 5CC  Final   Culture NO GROWTH 5 DAYS  Final   Report Status 10/29/2015 FINAL  Final  Surgical pcr screen     Status: None   Collection Time: 10/25/15  5:11 PM  Result Value Ref Range Status   MRSA, PCR NEGATIVE NEGATIVE Final   Staphylococcus aureus NEGATIVE NEGATIVE Final    Comment:        The Xpert SA Assay (FDA approved for NASAL specimens in patients over 33 years of age), is one component of a comprehensive surveillance program.  Test performance has been validated by [facility] for patients greater than or equal to 81 year old. It is not intended to diagnose infection nor to guide or monitor treatment.   Anaerobic culture     Status: None (Preliminary result)   Collection Time: 10/26/15  8:31 AM  Result Value Ref Range Status   Specimen Description ABSCESS FOOT RIGHT  Final   Special Requests POF VANCOMYCIN  Final   Gram Stain PENDING  Incomplete   Culture   Final    NO ANAEROBES ISOLATED; CULTURE IN PROGRESS FOR 5 DAYS Performed at [facility]    Report Status PENDING  Incomplete  Culture, routine-abscess     Status: None   Collection Time: 10/26/15  8:31 AM  Result Value Ref Range Status   Specimen Description ABSCESS FOOT RIGHT  Final   Special Requests POF VANCOMYCIN  Final   Gram Stain   Final    ABUNDANT WBC PRESENT,BOTH PMN AND MONONUCLEAR NO SQUAMOUS EPITHELIAL CELLS SEEN NO ORGANISMS SEEN Performed at [facility]    Culture   Final    FEW SERRATIA MARCESCENS Performed at [facility]    Report Status 10/29/2015 FINAL  Final   Organism ID,  Bacteria SERRATIA MARCESCENS  Final      Susceptibility   Serratia marcescens - MIC*  CEFAZOLIN >=64 RESISTANT Resistant     CEFEPIME <=1 SENSITIVE Sensitive     CEFTAZIDIME <=1 SENSITIVE Sensitive     CEFTRIAXONE <=1 SENSITIVE Sensitive     CIPROFLOXACIN <=0.25 SENSITIVE Sensitive     GENTAMICIN <=1 SENSITIVE Sensitive     TOBRAMYCIN 2 SENSITIVE Sensitive     TRIMETH/SULFA Value in next row Sensitive      <=20 SENSITIVE(NOTE)    * FEW SERRATIA MARCESCENS     Labs: Basic Metabolic Panel:  Recent Labs Lab 10/24/15 1153 10/25/15 0515 10/26/15 0352 10/30/15 0433 10/31/15 0512  NA 141 139 142 143  --   K 3.7 3.5 4.1 5.0  --   CL 108 106 109 108  --   CO2 25 24 24 26   --   GLUCOSE 115* 92 132* 77  --   BUN 6 <5* 8 10  --   CREATININE 0.66 0.62 0.71 0.73 0.63  CALCIUM 8.4* 7.9* 8.3* 8.3*  --    Liver Function Tests:  Recent Labs Lab 10/24/15 1153 10/25/15 0515  AST 64* 47*  ALT 40 29  ALKPHOS 134* 106  BILITOT 0.3 0.5  PROT 8.0 7.0  ALBUMIN 2.7* 2.2*   No results for input(s): LIPASE, AMYLASE in the last 168 hours. No results for input(s): AMMONIA in the last 168 hours. CBC:  Recent Labs Lab 10/24/15 1153 10/24/15 2054 10/25/15 0515 10/26/15 0352 10/30/15 0433  WBC 9.2 9.4 10.9* 8.1 4.7  NEUTROABS 7.0  --   --   --   --   HGB 10.6* 9.7* 9.9* 9.6* 10.4*  HCT 33.8* 32.5* 31.7* 32.0* 34.7*  MCV 89.2 90.8 90.1 90.1 93.0  PLT 386 348 392 413* 483*   Cardiac Enzymes: No results for input(s): CKTOTAL, CKMB, CKMBINDEX, TROPONINI in the last 168 hours. BNP: BNP (last 3 results) No results for input(s): BNP in the last 8760 hours.  ProBNP (last 3 results) No results for input(s): PROBNP in the last 8760 hours.  CBG:  Recent Labs Lab 10/29/15 2156 10/30/15 0629 10/30/15 1137 10/30/15 1650 10/30/15 2127  GLUCAP 155* 80 164* 132* 90       Signed:  Jeralyn Bennett MD  FACP  [facility] 10/31/2015, 9:54 AM

## 2015-11-01 LAB — GLUCOSE, CAPILLARY: GLUCOSE-CAPILLARY: 73 mg/dL (ref 65–99)

## 2015-11-01 NOTE — Progress Notes (Signed)
[facility] Progress Note   Allison Beasley  UDJ:[id number]  DOB: November 04, 1960  DOA: 10/24/2015 PCP: No PCP Per Patient   Subjective: Has no complaints anticipating discharge today  Assessment/Plan: Active Problems:    Cellulitis/Abscess  -S/p ACell and Wound Vac placement by Plastic Surgery on 10/29/2015 -Home Health Services will be set up, face to face completed.  -Discharge held on 10/30/2014 because wound vac had not been delivered.  -She was discharged on 11/01/2015    Antibiotics: Anti-infectives    Start     Dose/Rate Route Frequency Ordered Stop   10/31/15 0000  levofloxacin (LEVAQUIN) 500 MG tablet     500 mg Oral Daily 10/31/15 0951     10/29/15 1445  polymyxin B 500,000 Units, bacitracin 50,000 Units in sodium chloride irrigation 0.9 % 500 mL irrigation  Status:  Discontinued       As needed 10/29/15 1445 10/29/15 1543   10/29/15 0715  ceFAZolin (ANCEF) IVPB 2 g/50 mL premix     2 g 100 mL/hr over 30 Minutes Intravenous To [facility] Surgical 10/29/15 0707 10/29/15 1452   10/28/15 1500  levofloxacin (LEVAQUIN) tablet 500 mg     500 mg Oral Every 24 hours 10/28/15 1442 11/11/15 1459   10/28/15 1400  cephALEXin (KEFLEX) capsule 500 mg  Status:  Discontinued     500 mg Oral 3 times per day 10/28/15 1204 10/29/15 0658   10/27/15 1700  vancomycin (VANCOCIN) 1,750 mg in sodium chloride 0.9 % 500 mL IVPB  Status:  Discontinued     1,750 mg 250 mL/hr over 120 Minutes Intravenous Every 24 hours 10/27/15 1647 10/28/15 1156   10/26/15 2100  piperacillin-tazobactam (ZOSYN) IVPB 3.375 g  Status:  Discontinued     3.375 g 12.5 mL/hr over 240 Minutes Intravenous 3 times per day 10/26/15 1239 10/28/15 1156   10/26/15 1245  piperacillin-tazobactam (ZOSYN) IVPB 3.375 g     3.375 g 100 mL/hr over 30 Minutes Intravenous  Once 10/26/15 1239 10/26/15 1349   10/26/15 0000  vancomycin (VANCOCIN) IVPB 1000 mg/200 mL premix  Status:  Discontinued     1,000 mg 200 mL/hr over 60 Minutes  Intravenous To Surgery 10/25/15 1521 10/25/15 1530   10/25/15 1600  vancomycin (VANCOCIN) 1,500 mg in sodium chloride 0.9 % 500 mL IVPB  Status:  Discontinued     1,500 mg 250 mL/hr over 120 Minutes Intravenous Every 24 hours 10/24/15 1822 10/27/15 1647   10/24/15 1945  vancomycin (VANCOCIN) IVPB 1000 mg/200 mL premix  Status:  Discontinued     1,000 mg 200 mL/hr over 60 Minutes Intravenous  Once 10/24/15 1938 10/24/15 1945   10/24/15 1600  piperacillin-tazobactam (ZOSYN) IVPB 3.375 g     3.375 g 12.5 mL/hr over 240 Minutes Intravenous  Once 10/24/15 1547 10/24/15 1801   10/24/15 1600  vancomycin (VANCOCIN) IVPB 1000 mg/200 mL premix     1,000 mg 200 mL/hr over 60 Minutes Intravenous  Once 10/24/15 1547 10/24/15 1801     Code Status:     Code Status Orders        Start     Ordered   10/24/15 1650  Full code   Continuous     10/24/15 1649     Family Communication:   Disposition Plan: cont antibiotics DVT prophylaxis: Lovenox Consultants: surgery Procedures:     Objective: Filed Weights   10/24/15 1022  Weight: 58.968 kg (130 lb)    Intake/Output Summary (Last 24 hours) at 11/01/15 1031 Last data  filed at 11/01/15 0640  Gross per 24 hour  Intake      0 ml  Output     20 ml  Net    -20 ml     Vitals Filed Vitals:   10/31/15 1522 10/31/15 2025 11/01/15 0540 11/01/15 0946  BP: 132/74 140/84 136/77 146/87  Pulse: 75 73 74   Temp: 98.3 F (36.8 C) 99.1 F (37.3 C) 98.9 F (37.2 C)   TempSrc: Oral Oral    Resp: 16 16 15    Height:      Weight:      SpO2: 97% 100% 98%     Exam:  General:  Pt is alert, not in acute distress, nontoxic appearing  HEENT: No icterus, No thrush, oral mucosa moist  Cardiovascular: regular rate and rhythm, S1/S2 No murmur  Respiratory: clear to auscultation bilaterally   Abdomen: Soft, +Bowel sounds, non tender, non distended, no guarding  MSK: S/p wound vac placement to right foot    Data Reviewed: Basic Metabolic  Panel:  Recent Labs Lab 10/26/15 0352 10/30/15 0433 10/31/15 0512  NA 142 143  --   K 4.1 5.0  --   CL 109 108  --   CO2 24 26  --   GLUCOSE 132* 77  --   BUN 8 10  --   CREATININE 0.71 0.73 0.63  CALCIUM 8.3* 8.3*  --    Liver Function Tests: No results for input(s): AST, ALT, ALKPHOS, BILITOT, PROT, ALBUMIN in the last 168 hours. No results for input(s): LIPASE, AMYLASE in the last 168 hours. No results for input(s): AMMONIA in the last 168 hours. CBC:  Recent Labs Lab 10/26/15 0352 10/30/15 0433  WBC 8.1 4.7  HGB 9.6* 10.4*  HCT 32.0* 34.7*  MCV 90.1 93.0  PLT 413* 483*   Cardiac Enzymes: No results for input(s): CKTOTAL, CKMB, CKMBINDEX, TROPONINI in the last 168 hours. BNP (last 3 results) No results for input(s): BNP in the last 8760 hours.  ProBNP (last 3 results) No results for input(s): PROBNP in the last 8760 hours.  CBG:  Recent Labs Lab 10/30/15 2127 10/31/15 0628 10/31/15 1124 10/31/15 1631 11/01/15 0622  GLUCAP 90 74 187* 79 73    Recent Results (from the past 240 hour(s))  Blood culture (routine x 2)     Status: None   Collection Time: 10/24/15 11:44 AM  Result Value Ref Range Status   Specimen Description BLOOD RIGHT ANTECUBITAL  Final   Special Requests BOTTLES DRAWN AEROBIC AND ANAEROBIC 5CC  Final   Culture NO GROWTH 5 DAYS  Final   Report Status 10/29/2015 FINAL  Final  Blood culture (routine x 2)     Status: None   Collection Time: 10/24/15 11:46 AM  Result Value Ref Range Status   Specimen Description BLOOD RIGHT ANTECUBITAL  Final   Special Requests BOTTLES DRAWN AEROBIC AND ANAEROBIC 5CC  Final   Culture NO GROWTH 5 DAYS  Final   Report Status 10/29/2015 FINAL  Final  Surgical pcr screen     Status: None   Collection Time: 10/25/15  5:11 PM  Result Value Ref Range Status   MRSA, PCR NEGATIVE NEGATIVE Final   Staphylococcus aureus NEGATIVE NEGATIVE Final    Comment:        The Xpert SA Assay (FDA approved for NASAL  specimens in patients over 23 years of age), is one component of a comprehensive surveillance program.  Test performance has been validated by [facility] for  patients greater than or equal to 77 year old. It is not intended to diagnose infection nor to guide or monitor treatment.   Anaerobic culture     Status: None   Collection Time: 10/26/15  8:31 AM  Result Value Ref Range Status   Specimen Description ABSCESS FOOT RIGHT  Final   Special Requests POF VANCOMYCIN  Final   Gram Stain   Final    ABUNDANT WBC PRESENT,BOTH PMN AND MONONUCLEAR NO SQUAMOUS EPITHELIAL CELLS SEEN NO ORGANISMS SEEN Performed at [facility]    Culture   Final    NO ANAEROBES ISOLATED Performed at [facility]    Report Status 10/31/2015 FINAL  Final  Culture, routine-abscess     Status: None   Collection Time: 10/26/15  8:31 AM  Result Value Ref Range Status   Specimen Description ABSCESS FOOT RIGHT  Final   Special Requests POF VANCOMYCIN  Final   Gram Stain   Final    ABUNDANT WBC PRESENT,BOTH PMN AND MONONUCLEAR NO SQUAMOUS EPITHELIAL CELLS SEEN NO ORGANISMS SEEN Performed at [facility]    Culture   Final    FEW SERRATIA MARCESCENS Performed at [facility]    Report Status 10/29/2015 FINAL  Final   Organism ID, Bacteria SERRATIA MARCESCENS  Final      Susceptibility   Serratia marcescens - MIC*    CEFAZOLIN >=64 RESISTANT Resistant     CEFEPIME <=1 SENSITIVE Sensitive     CEFTAZIDIME <=1 SENSITIVE Sensitive     CEFTRIAXONE <=1 SENSITIVE Sensitive     CIPROFLOXACIN <=0.25 SENSITIVE Sensitive     GENTAMICIN <=1 SENSITIVE Sensitive     TOBRAMYCIN 2 SENSITIVE Sensitive     TRIMETH/SULFA Value in next row Sensitive      <=20 SENSITIVE(NOTE)    * FEW SERRATIA MARCESCENS     Studies: No results found.  Scheduled Meds:  Scheduled Meds: . amLODipine  10 mg Oral Daily  . Chlorhexidine Gluconate Cloth  6 each Topical Q0600  . enoxaparin (LOVENOX)  injection  40 mg Subcutaneous Q24H  . escitalopram  10 mg Oral Daily  . insulin aspart  0-15 Units Subcutaneous TID WC  . insulin aspart  0-5 Units Subcutaneous QHS  . levofloxacin  500 mg Oral Q24H  . pantoprazole  40 mg Oral Daily  . predniSONE  10 mg Oral Q breakfast   Continuous Infusions: . lactated ringers      Time spent on care of this patient: 15 min   Jeralyn Bennett, MD 11/01/2015, 10:31 AM  LOS: 8 days   [facility] Office  [phone] Pager - Text Page per [email or web address] If 7PM-7AM, please contact night-coverage [email or web address]

## 2015-11-01 NOTE — Progress Notes (Signed)
Prescription and discharge instructions provided to patient.  Patient denies having any discharge related questions.  All belongings gathered at patient's bedside.  Home wound vac applied and instructions on use provided.  Patient is awaiting the arrival of their ride.

## 2015-11-02 LAB — GLUCOSE, CAPILLARY: GLUCOSE-CAPILLARY: 125 mg/dL — AB (ref 65–99)

## 2015-11-11 ENCOUNTER — Encounter (HOSPITAL_COMMUNITY): Payer: Self-pay | Admitting: *Deleted

## 2015-11-11 ENCOUNTER — Ambulatory Visit: Payer: Self-pay | Admitting: Physician Assistant

## 2015-11-11 ENCOUNTER — Other Ambulatory Visit: Payer: Self-pay | Admitting: Plastic Surgery

## 2015-11-11 DIAGNOSIS — S91301A Unspecified open wound, right foot, initial encounter: Secondary | ICD-10-CM

## 2015-11-11 NOTE — Progress Notes (Signed)
Pt denies cardiac history or chest pain. She states she gets some sob with exertion since it's difficult for her to get around since surgery. Pt denies any type of drainage from foot.

## 2015-11-12 ENCOUNTER — Ambulatory Visit (HOSPITAL_COMMUNITY)
Admission: RE | Admit: 2015-11-12 | Discharge: 2015-11-12 | Disposition: A | Discharge status: Home / Self Care | Payer: BLUE CROSS/BLUE SHIELD | Source: Ambulatory Visit | Attending: Plastic Surgery | Admitting: Plastic Surgery

## 2015-11-12 ENCOUNTER — Inpatient Hospital Stay (HOSPITAL_COMMUNITY): Payer: BLUE CROSS/BLUE SHIELD | Admitting: Anesthesiology

## 2015-11-12 ENCOUNTER — Encounter (HOSPITAL_COMMUNITY)
Admission: RE | Disposition: A | Discharge status: Home / Self Care | Payer: Self-pay | Source: Ambulatory Visit | Attending: Plastic Surgery

## 2015-11-12 ENCOUNTER — Encounter (HOSPITAL_COMMUNITY): Payer: Self-pay | Admitting: *Deleted

## 2015-11-12 DIAGNOSIS — I10 Essential (primary) hypertension: Secondary | ICD-10-CM | POA: Insufficient documentation

## 2015-11-12 DIAGNOSIS — Z7952 Long term (current) use of systemic steroids: Secondary | ICD-10-CM | POA: Insufficient documentation

## 2015-11-12 DIAGNOSIS — Z89411 Acquired absence of right great toe: Secondary | ICD-10-CM | POA: Insufficient documentation

## 2015-11-12 DIAGNOSIS — M797 Fibromyalgia: Secondary | ICD-10-CM | POA: Diagnosis not present

## 2015-11-12 DIAGNOSIS — Z79899 Other long term (current) drug therapy: Secondary | ICD-10-CM | POA: Diagnosis not present

## 2015-11-12 DIAGNOSIS — M1990 Unspecified osteoarthritis, unspecified site: Secondary | ICD-10-CM | POA: Insufficient documentation

## 2015-11-12 DIAGNOSIS — E1151 Type 2 diabetes mellitus with diabetic peripheral angiopathy without gangrene: Secondary | ICD-10-CM | POA: Insufficient documentation

## 2015-11-12 DIAGNOSIS — K219 Gastro-esophageal reflux disease without esophagitis: Secondary | ICD-10-CM | POA: Insufficient documentation

## 2015-11-12 DIAGNOSIS — S91301A Unspecified open wound, right foot, initial encounter: Secondary | ICD-10-CM

## 2015-11-12 DIAGNOSIS — L97519 Non-pressure chronic ulcer of other part of right foot with unspecified severity: Secondary | ICD-10-CM | POA: Insufficient documentation

## 2015-11-12 HISTORY — DX: Rheumatoid arthritis, unspecified: M06.9

## 2015-11-12 HISTORY — PX: APPLICATION OF A-CELL OF EXTREMITY: SHX6303

## 2015-11-12 HISTORY — PX: I & D EXTREMITY: SHX5045

## 2015-11-12 HISTORY — PX: MINOR APPLICATION OF WOUND VAC: SHX6243

## 2015-11-12 LAB — GLUCOSE, CAPILLARY
GLUCOSE-CAPILLARY: 85 mg/dL (ref 65–99)
Glucose-Capillary: 60 mg/dL — ABNORMAL LOW (ref 65–99)
Glucose-Capillary: 90 mg/dL (ref 65–99)

## 2015-11-12 SURGERY — IRRIGATION AND DEBRIDEMENT EXTREMITY
Anesthesia: General | Laterality: Right

## 2015-11-12 MED ORDER — MIDAZOLAM HCL 2 MG/2ML IJ SOLN
INTRAMUSCULAR | Status: AC
  Filled 2015-11-12: qty 2

## 2015-11-12 MED ORDER — FENTANYL CITRATE (PF) 250 MCG/5ML IJ SOLN
INTRAMUSCULAR | Status: AC
  Filled 2015-11-12: qty 5

## 2015-11-12 MED ORDER — DEXTROSE 50 % IV SOLN
25.0000 mL | Freq: Once | INTRAVENOUS | Status: AC
  Administered 2015-11-12: 25 mL via INTRAVENOUS
  Filled 2015-11-12: qty 50

## 2015-11-12 MED ORDER — 0.9 % SODIUM CHLORIDE (POUR BTL) OPTIME
TOPICAL | Status: DC | PRN
  Administered 2015-11-12: 1000 mL

## 2015-11-12 MED ORDER — DEXTROSE 50 % IV SOLN
INTRAVENOUS | Status: AC
  Filled 2015-11-12: qty 50

## 2015-11-12 MED ORDER — EPHEDRINE SULFATE 50 MG/ML IJ SOLN
INTRAMUSCULAR | Status: DC | PRN
  Administered 2015-11-12 (×2): 15 mg via INTRAVENOUS
  Administered 2015-11-12: 10 mg via INTRAVENOUS

## 2015-11-12 MED ORDER — ONDANSETRON HCL 4 MG/2ML IJ SOLN
INTRAMUSCULAR | Status: DC | PRN
  Administered 2015-11-12: 4 mg via INTRAVENOUS

## 2015-11-12 MED ORDER — PROPOFOL 10 MG/ML IV BOLUS
INTRAVENOUS | Status: DC | PRN
  Administered 2015-11-12: 150 mg via INTRAVENOUS

## 2015-11-12 MED ORDER — PHENYLEPHRINE HCL 10 MG/ML IJ SOLN
INTRAMUSCULAR | Status: DC | PRN
  Administered 2015-11-12: 160 ug via INTRAVENOUS
  Administered 2015-11-12 (×2): 120 ug via INTRAVENOUS

## 2015-11-12 MED ORDER — LIDOCAINE HCL (CARDIAC) 20 MG/ML IV SOLN
INTRAVENOUS | Status: DC | PRN
  Administered 2015-11-12: 80 mg via INTRAVENOUS

## 2015-11-12 MED ORDER — HYDROMORPHONE HCL 1 MG/ML IJ SOLN
INTRAMUSCULAR | Status: AC
  Filled 2015-11-12: qty 1

## 2015-11-12 MED ORDER — CEFAZOLIN SODIUM-DEXTROSE 2-3 GM-% IV SOLR
2.0000 g | INTRAVENOUS | Status: AC
  Administered 2015-11-12: 2 g via INTRAVENOUS
  Filled 2015-11-12: qty 50

## 2015-11-12 MED ORDER — HYDROMORPHONE HCL 1 MG/ML IJ SOLN
0.2500 mg | INTRAMUSCULAR | Status: DC | PRN
  Administered 2015-11-12 (×2): 0.5 mg via INTRAVENOUS

## 2015-11-12 MED ORDER — LACTATED RINGERS IV SOLN
INTRAVENOUS | Status: DC
  Administered 2015-11-12: 10:00:00 via INTRAVENOUS

## 2015-11-12 MED ORDER — DEXTROSE 5 % IV SOLN
10.0000 mg | INTRAVENOUS | Status: DC | PRN
  Administered 2015-11-12: 20 ug/min via INTRAVENOUS

## 2015-11-12 MED ORDER — SODIUM CHLORIDE 0.9 % IR SOLN
Status: DC | PRN
  Administered 2015-11-12: 500 mL

## 2015-11-12 MED ORDER — MIDAZOLAM HCL 5 MG/5ML IJ SOLN
INTRAMUSCULAR | Status: DC | PRN
  Administered 2015-11-12: 2 mg via INTRAVENOUS

## 2015-11-12 SURGICAL SUPPLY — 51 items
BANDAGE ELASTIC 4 VELCRO ST LF (GAUZE/BANDAGES/DRESSINGS) ×2 IMPLANT
BLADE SURG ROTATE 9660 (MISCELLANEOUS) IMPLANT
BNDG GAUZE ELAST 4 BULKY (GAUZE/BANDAGES/DRESSINGS) ×2 IMPLANT
CANISTER SUCTION 2500CC (MISCELLANEOUS) ×3 IMPLANT
CANISTER WOUND CARE 500ML ATS (WOUND CARE) ×3 IMPLANT
CHLORAPREP W/TINT 26ML (MISCELLANEOUS) IMPLANT
COVER SURGICAL LIGHT HANDLE (MISCELLANEOUS) ×3 IMPLANT
DRAPE EXTREMITY T 121X128X90 (DRAPE) IMPLANT
DRAPE INCISE IOBAN 66X45 STRL (DRAPES) IMPLANT
DRAPE ORTHO SPLIT 77X108 STRL (DRAPES)
DRAPE SURG ORHT 6 SPLT 77X108 (DRAPES) IMPLANT
DRSG ADAPTIC 3X8 NADH LF (GAUZE/BANDAGES/DRESSINGS) IMPLANT
DRSG PAD ABDOMINAL 8X10 ST (GAUZE/BANDAGES/DRESSINGS) IMPLANT
DRSG VAC ATS LRG SENSATRAC (GAUZE/BANDAGES/DRESSINGS) IMPLANT
DRSG VAC ATS MED SENSATRAC (GAUZE/BANDAGES/DRESSINGS) IMPLANT
DRSG VAC ATS SM SENSATRAC (GAUZE/BANDAGES/DRESSINGS) ×2 IMPLANT
ELECT CAUTERY BLADE 6.4 (BLADE) ×3 IMPLANT
ELECT REM PT RETURN 9FT ADLT (ELECTROSURGICAL) ×3
ELECTRODE REM PT RTRN 9FT ADLT (ELECTROSURGICAL) ×1 IMPLANT
GAUZE SPONGE 4X4 12PLY STRL (GAUZE/BANDAGES/DRESSINGS) IMPLANT
GEL ULTRASOUND 20GR AQUASONIC (MISCELLANEOUS) IMPLANT
GLOVE BIO SURGEON STRL SZ 6.5 (GLOVE) ×4 IMPLANT
GLOVE BIO SURGEONS STRL SZ 6.5 (GLOVE) ×2
GLOVE BIOGEL PI IND STRL 6.5 (GLOVE) IMPLANT
GLOVE BIOGEL PI INDICATOR 6.5 (GLOVE) ×6
GLOVE ECLIPSE 6.5 STRL STRAW (GLOVE) ×2 IMPLANT
GOWN STRL REUS W/ TWL LRG LVL3 (GOWN DISPOSABLE) ×3 IMPLANT
GOWN STRL REUS W/TWL LRG LVL3 (GOWN DISPOSABLE) ×9
HANDPIECE INTERPULSE COAX TIP (DISPOSABLE)
KIT BASIN OR (CUSTOM PROCEDURE TRAY) ×3 IMPLANT
KIT ROOM TURNOVER OR (KITS) ×3 IMPLANT
MATRIX WOUND MESHED 2X2 (Tissue) IMPLANT
NS IRRIG 1000ML POUR BTL (IV SOLUTION) ×3 IMPLANT
PACK GENERAL/GYN (CUSTOM PROCEDURE TRAY) ×3 IMPLANT
PACK ORTHO EXTREMITY (CUSTOM PROCEDURE TRAY) ×3 IMPLANT
PAD ARMBOARD 7.5X6 YLW CONV (MISCELLANEOUS) ×6 IMPLANT
PAD NEG PRESSURE SENSATRAC (MISCELLANEOUS) IMPLANT
SET HNDPC FAN SPRY TIP SCT (DISPOSABLE) IMPLANT
STAPLER VISISTAT 35W (STAPLE) IMPLANT
STOCKINETTE IMPERVIOUS 9X36 MD (GAUZE/BANDAGES/DRESSINGS) IMPLANT
STOCKINETTE IMPERVIOUS LG (DRAPES) IMPLANT
SUT SILK 4 0 P 3 (SUTURE) IMPLANT
SUT SILK 4 0 PS 2 (SUTURE) IMPLANT
SUT VIC AB 5-0 PS2 18 (SUTURE) ×4 IMPLANT
TOWEL OR 17X24 6PK STRL BLUE (TOWEL DISPOSABLE) ×3 IMPLANT
TOWEL OR 17X26 10 PK STRL BLUE (TOWEL DISPOSABLE) ×3 IMPLANT
TUBE CONNECTING 12'X1/4 (SUCTIONS) ×1
TUBE CONNECTING 12X1/4 (SUCTIONS) ×2 IMPLANT
UNDERPAD 30X30 INCONTINENT (UNDERPADS AND DIAPERS) IMPLANT
WOUND MATRIX MESHED 2X2 (Tissue) ×2 IMPLANT
YANKAUER SUCT BULB TIP NO VENT (SUCTIONS) ×3 IMPLANT

## 2015-11-12 NOTE — H&P (View-Only) (Signed)
Reason for Consult:right foot wound Referring Physician: Dr. Charles Fields  Allison Beasley is an 54 y.o. female.  HPI: The patient is a 54 yrs old bf here for treatment of a right foot abscess.  She has been dealing with pain and wounds on the foot for months.  She had a right great toe amputation followed by wound breakdown and infection twice.  She then went to visit her daughter in [place] and when she got off the plane had a swollen red sore foot. She was seen and admitted via the ED and underwent drainage and debridement of the foot abscess. She now has a ~ 4 x 5 cm wound with tendon exposed.  The past medical history is listed below.    Past Medical History  Diagnosis Date  . Osteoarthritis   . Osteopenia   . Raynaud phenomenon   . Constipation   . Shortness of breath   . GERD (gastroesophageal reflux disease)   . Headache(784.0)   . Anemia   . Hypertension   . Seizures (HCC)     with chid birth 0f 2 childre  . Peripheral vascular disease (HCC)   . Depression   . Anxiety   . Type II diabetes mellitus (HCC)   . Polymyositis (HCC)   . Fibromyalgia   . Pneumonia 03/2012  . Polymyositis (HCC)     Past Surgical History  Procedure Laterality Date  . Cesarean section      x3  . Tubal ligation    . Hysteroscopy      w/ D&C with excision of endometrial polyps  . Left great toenail removal  Left 2013    done by [place] [facility]  . Tonsillectomy Bilateral   . Amputation Left 12/24/2012    Procedure: AMPUTATION DIGIT left great toe;  Surgeon: Charles E Fields, MD;  Location: [facility] OR;  Service: Vascular;  Laterality: Left;  GREAT  . Abdominal aortagram N/A 07/25/2014    Procedure: ABDOMINAL AORTAGRAM;  Surgeon: Charles E Fields, MD;  Location: [facility] CATH LAB;  Service: Cardiovascular;  Laterality: N/A;  . Peripheral vascular catheterization N/A 05/15/2015    Procedure: Abdominal Aortogram;  Surgeon: Charles E Fields, MD;  Location: [facility] INVASIVE CV LAB;  Service: Cardiovascular;  Laterality: N/A;   . Colonoscopy    . Amputation Right 05/19/2015    Procedure: AMPUTATION DIGIT;  Surgeon: Charles E Fields, MD;  Location: [facility] OR;  Service: Vascular;  Laterality: Right;  . Amputation Right 07/22/2015    Procedure: RAY AMPUTATION OF RIGHT GREAT TOE AMPUTATION SITE;  Surgeon: Charles E Fields, MD;  Location: [facility] OR;  Service: Vascular;  Laterality: Right;  . Irrigation and debridement foot Right 10/26/2015  . I&d extremity Right 10/26/2015    Procedure: IRRIGATION AND DEBRIDEMENT EXTREMITY/RIGHT FOOT;  Surgeon: Brian L Chen, MD;  Location: [facility] OR;  Service: Vascular;  Laterality: Right;    Family History  Problem Relation Age of Onset  . Diabetes Mother   . Hypertension Mother   . Diabetes Father   . Heart disease Father   . Hypertension Sister   . Heart disease Sister     Social History:  reports that she has never smoked. She has never used smokeless tobacco. She reports that she drinks alcohol. She reports that she does not use illicit drugs.  Allergies:  Allergies  Allergen Reactions  . Clindamycin/Lincomycin Itching and Rash    Possible reaction (took clindamycin and cipro at the same time)  . Doxycycline Nausea Only  .   Percocet [Oxycodone-Acetaminophen] Itching  . Tape Itching and Other (See Comments)    Plastic tape causes itching-USE PAPER TAPE ONLY  . Mycophenolate Diarrhea  . Ciprofloxacin Itching and Rash    Possible reaction (took clindamycin and cipro at the same time)     Medications: I have reviewed the patient's current medications.  Results for orders placed or performed during the hospital encounter of 10/24/15 (from the past 48 hour(s))  Glucose, capillary     Status: Abnormal   Collection Time: 10/26/15  4:44 PM  Result Value Ref Range   Glucose-Capillary 144 (H) 65 - 99 mg/dL  Glucose, capillary     Status: Abnormal   Collection Time: 10/26/15  9:29 PM  Result Value Ref Range   Glucose-Capillary 123 (H) 65 - 99 mg/dL  Glucose, capillary     Status: None    Collection Time: 10/27/15  6:21 AM  Result Value Ref Range   Glucose-Capillary 94 65 - 99 mg/dL  Glucose, capillary     Status: Abnormal   Collection Time: 10/27/15 11:24 AM  Result Value Ref Range   Glucose-Capillary 109 (H) 65 - 99 mg/dL   Comment 1 Notify RN   Vancomycin, trough     Status: Abnormal   Collection Time: 10/27/15  3:07 PM  Result Value Ref Range   Vancomycin Tr 8 (L) 10.0 - 20.0 ug/mL  Glucose, capillary     Status: Abnormal   Collection Time: 10/27/15  4:17 PM  Result Value Ref Range   Glucose-Capillary 113 (H) 65 - 99 mg/dL   Comment 1 Repeat Test    Comment 2 Document in Chart   Glucose, capillary     Status: Abnormal   Collection Time: 10/27/15  9:39 PM  Result Value Ref Range   Glucose-Capillary 140 (H) 65 - 99 mg/dL  Glucose, capillary     Status: None   Collection Time: 10/28/15  6:34 AM  Result Value Ref Range   Glucose-Capillary 77 65 - 99 mg/dL  Glucose, capillary     Status: Abnormal   Collection Time: 10/28/15 11:32 AM  Result Value Ref Range   Glucose-Capillary 135 (H) 65 - 99 mg/dL   Comment 1 Repeat Test    Comment 2 Document in Chart     No results found.  Review of Systems  Constitutional: Positive for malaise/fatigue.  HENT: Negative.   Eyes: Negative.   Respiratory: Negative.   Cardiovascular: Negative.   Gastrointestinal: Negative.   Genitourinary: Negative.   Musculoskeletal: Negative.   Skin: Negative.   Neurological: Positive for weakness.  Psychiatric/Behavioral: Negative.    Blood pressure 131/76, pulse 84, temperature 98.8 F (37.1 C), temperature source Oral, resp. rate 18, height 5\' 1"  (1.549 m), weight 58.968 kg (130 lb), last menstrual period 06/20/2012, SpO2 100 %. Physical Exam  Constitutional: She is oriented to person, place, and time. She appears well-developed.  HENT:  Head: Normocephalic and atraumatic.  Eyes: Conjunctivae are normal. Pupils are equal, round, and reactive to light.  Cardiovascular: Normal  rate.   Respiratory: No respiratory distress.  Musculoskeletal:       Right ankle: She exhibits swelling and deformity. She exhibits no ecchymosis. Tenderness.  Neurological: She is alert and oriented to person, place, and time.  Skin: Skin is warm.  Psychiatric: She has a normal mood and affect. Her behavior is normal. Judgment and thought content normal.    Assessment/Plan: Recommend debridement with Acell and VAC placement.  This is a serious situation due to her  underlying medical problems.  She is aware of the severity.  Worth every effort to save her foot.  Check prealbumin.  Peggye Form 10/28/2015, 4:18 PM

## 2015-11-12 NOTE — Progress Notes (Signed)
Patient blood glucose 60, notified Dr. Desmond Lope new orders received.

## 2015-11-12 NOTE — Brief Op Note (Signed)
11/05/2015 - 11/12/2015  11:53 AM  PATIENT:  Allison Beasley  55 y.o. female  PRE-OPERATIVE DIAGNOSIS:  right leg ulcers  POST-OPERATIVE DIAGNOSIS:  right leg ulcers  PROCEDURE:  Procedure(s): IRRIGATION AND DEBRIDEMENT RIGHT EXTREMITY (Right) INTEGRA (Right) MINOR APPLICATION OF WOUND VAC (Right)  SURGEON:  Surgeon(s) and Role:    * Alena Bills Dillingham, DO - Primary  PHYSICIAN ASSISTANT: Shawn Rayburn, PA  ASSISTANTS: none   ANESTHESIA:   general  EBL:     BLOOD ADMINISTERED:none  DRAINS: none   LOCAL MEDICATIONS USED:  NONE  SPECIMEN:  No Specimen  DISPOSITION OF SPECIMEN:  N/A  COUNTS:  YES  TOURNIQUET:  * No tourniquets in log *  DICTATION: .Dragon Dictation  PLAN OF CARE:Discharge to facility after PACU  PATIENT DISPOSITION:  PACU - hemodynamically stable.   Delay start of Pharmacological VTE agent (>24hrs) due to surgical blood loss or risk of bleeding: no

## 2015-11-12 NOTE — Discharge Instructions (Signed)
VAC change once a week No pressure on foot

## 2015-11-12 NOTE — Anesthesia Postprocedure Evaluation (Signed)
Anesthesia Post Note  Patient: Allison Beasley  Procedure(s) Performed: Procedure(s) (LRB): IRRIGATION AND DEBRIDEMENT RIGHT EXTREMITY (Right) INTEGRA (Right) MINOR APPLICATION OF WOUND VAC (Right)  Patient location during evaluation: PACU Anesthesia Type: General Level of consciousness: awake and alert Pain management: pain level controlled Vital Signs Assessment: post-procedure vital signs reviewed and stable Respiratory status: spontaneous breathing, nonlabored ventilation, respiratory function stable and patient connected to nasal cannula oxygen Cardiovascular status: blood pressure returned to baseline and stable Postop Assessment: no signs of nausea or vomiting Anesthetic complications: no    Last Vitals:  Filed Vitals:   11/12/15 1312 11/12/15 1317  BP: 119/78 127/77  Pulse: 82 92  Temp: 36.1 C 36.1 C  Resp: 12 13    Last Pain:  Filed Vitals:   11/12/15 1318  PainSc: Asleep                 Cecile Hearing

## 2015-11-12 NOTE — Anesthesia Procedure Notes (Signed)
Procedure Name: LMA Insertion Date/Time: 11/12/2015 11:26 AM Performed by: Faustino Congress Ledford Goodson Pre-anesthesia Checklist: Patient identified, Emergency Drugs available, Suction available and Patient being monitored Patient Re-evaluated:Patient Re-evaluated prior to inductionOxygen Delivery Method: Circle system utilized Preoxygenation: Pre-oxygenation with 100% oxygen Intubation Type: IV induction and Inhalational induction Ventilation: Mask ventilation without difficulty LMA: LMA inserted LMA Size: 4.0 Number of attempts: 1 Placement Confirmation: positive ETCO2 and breath sounds checked- equal and bilateral Tube secured with: Tape Dental Injury: Teeth and Oropharynx as per pre-operative assessment

## 2015-11-12 NOTE — Progress Notes (Signed)
Report given to caroline rn as caregiver 

## 2015-11-12 NOTE — Interval H&P Note (Signed)
History and Physical Interval Note:  11/12/2015 11:11 AM  Allison Beasley  has presented today for surgery, with the diagnosis of right leg ulcers  The various methods of treatment have been discussed with the patient and family. After consideration of risks, benefits and other options for treatment, the patient has consented to  Procedure(s): IRRIGATION AND DEBRIDEMENT EXTREMITY (Right) INTEGRA (Right) MINOR APPLICATION OF WOUND VAC (Right) as a surgical intervention .  The patient's history has been reviewed, patient examined, no change in status, stable for surgery.  I have reviewed the patient's chart and labs.  Questions were answered to the patient's satisfaction.     Peggye Form

## 2015-11-12 NOTE — Transfer of Care (Signed)
Immediate Anesthesia Transfer of Care Note  Patient: Allison Beasley  Procedure(s) Performed: Procedure(s): IRRIGATION AND DEBRIDEMENT RIGHT EXTREMITY (Right) INTEGRA (Right) MINOR APPLICATION OF WOUND VAC (Right)  Patient Location: PACU  Anesthesia Type:General  Level of Consciousness: awake, alert , oriented and patient cooperative  Airway & Oxygen Therapy: Patient Spontanous Breathing and Patient connected to nasal cannula oxygen  Post-op Assessment: Report given to RN and Post -op Vital signs reviewed and stable  Post vital signs: Reviewed and stable  Last Vitals:  Filed Vitals:   11/12/15 0944  BP: 138/89  Pulse: 91  Temp: 36.4 C    Complications: No apparent anesthesia complications

## 2015-11-12 NOTE — Op Note (Signed)
Operative Note   DATE OF OPERATION: 11/12/2015  LOCATION: [facility]  SURGICAL DIVISION: Plastic Surgery  PREOPERATIVE DIAGNOSES:  Right foot ulcer  POSTOPERATIVE DIAGNOSES:  same  PROCEDURE:  Preparation of right foot ulcer (4.0 x 4.0 x 1.0 cm) for placement of Integral (sheet 5 x 5)   SURGEON: Claire Sanger, DO  ANESTHESIA:  General.   COMPLICATIONS: None.   INDICATIONS FOR PROCEDURE:  The patient, Allison Beasley is a 55 y.o. female born on 1961/10/13, is here for treatment of a large ulcer of the right foot at the plantar area.  She had an abscess that was drained and now has a wound. She has multiple medical conditions that make healing a challenge. MRN: [id number]  CONSENT:  Informed consent was obtained directly from the patient. Risks, benefits and alternatives were fully discussed. Specific risks including but not limited to bleeding, infection, hematoma, seroma, scarring, pain, infection, contracture, asymmetry, wound healing problems, and need for further surgery were all discussed. The patient did have an ample opportunity to have questions answered to satisfaction.   DESCRIPTION OF PROCEDURE:  The patient was taken to the operating room. SCD was placed on the left leg and IV antibiotics were given. The patient's operative site was prepped and draped in a sterile fashion. A time out was performed and all information was confirmed to be correct.  General anesthesia was administered.  The area was irrigated with antibiotic solution and saline.  Debridement was done with a #10 blade of the skin for the 4 x 4 cm wound.  The Integra sheet 4 x 4cm was applied and secured with 5-0 Vicryl.  The surgical lube and VAC was applied.  The leg was wrapped with kerlex and an ace wrap. The patient tolerated the procedure well.  There were no complications. The patient was allowed to wake from anesthesia, extubated and taken to the recovery room in satisfactory condition.

## 2015-11-12 NOTE — Anesthesia Preprocedure Evaluation (Addendum)
Anesthesia Evaluation  Patient identified by MRN, date of birth, ID band Patient awake    Reviewed: Allergy & Precautions, H&P , NPO status , Patient's Chart, lab work & pertinent test results  Airway Mallampati: III  TM Distance: >3 FB Neck ROM: Full    Dental no notable dental hx. (+) Teeth Intact, Dental Advisory Given   Pulmonary neg pulmonary ROS,    Pulmonary exam normal breath sounds clear to auscultation       Cardiovascular hypertension, Pt. on medications + Peripheral Vascular Disease   Rhythm:Regular Rate:Normal     Neuro/Psych Seizures -, Well Controlled,  Anxiety Depression    GI/Hepatic Neg liver ROS, GERD  Medicated and Controlled,  Endo/Other  diabetes, Type 2, Oral Hypoglycemic Agents  Renal/GU negative Renal ROS  negative genitourinary   Musculoskeletal  (+) Arthritis , Osteoarthritis, Rheumatoid disorders and steroids,  Fibromyalgia -  Abdominal   Peds  Hematology negative hematology ROS (+) anemia ,   Anesthesia Other Findings   Reproductive/Obstetrics negative OB ROS                            Anesthesia Physical Anesthesia Plan  ASA: III  Anesthesia Plan: General   Post-op Pain Management:    Induction: Intravenous  Airway Management Planned: LMA  Additional Equipment:   Intra-op Plan:   Post-operative Plan: Extubation in OR  Informed Consent: I have reviewed the patients History and Physical, chart, labs and discussed the procedure including the risks, benefits and alternatives for the proposed anesthesia with the patient or authorized representative who has indicated his/her understanding and acceptance.   Dental advisory given  Plan Discussed with: CRNA  Anesthesia Plan Comments:         Anesthesia Quick Evaluation

## 2015-11-13 ENCOUNTER — Encounter (HOSPITAL_COMMUNITY): Payer: Self-pay | Admitting: Plastic Surgery

## 2015-11-27 ENCOUNTER — Other Ambulatory Visit: Payer: Self-pay | Admitting: Plastic Surgery

## 2015-11-27 DIAGNOSIS — L97512 Non-pressure chronic ulcer of other part of right foot with fat layer exposed: Secondary | ICD-10-CM

## 2015-11-27 NOTE — H&P (Signed)
Allison Beasley is an 55 y.o. female.   Chief Complaint: right foot ulcer HPI: The patient is a 55 yrs old bf here for treatment of her right foot ulcer.  She has a past medical history of osteoarthritis, Raynaud phenomenon, GERD, Anemia, Hypertension, Peripheral vascular disease, Depression,Type II diabetes mellitus, Polymyositis.  She has undergone debridement of the area with Acell and VAC dressing placement (10/29/15 and 11/12/15).   She is overall doing well. There is no sign of infection.    Past Medical History  Diagnosis Date  . Osteopenia   . Raynaud phenomenon   . Constipation   . GERD (gastroesophageal reflux disease)   . Headache(784.0)   . Anemia   . Hypertension   . Seizures (HCC)     with chid birth 37f 2 childre  . Peripheral vascular disease (HCC)   . Depression   . Anxiety   . Polymyositis (HCC)   . Fibromyalgia   . Pneumonia 03/2012  . Polymyositis (HCC)   . Shortness of breath     with exertion  . Type II diabetes mellitus (HCC)     on no medications  . Osteoarthritis   . Rheumatoid arthritis (HCC)   . Polymyositis [facility])     Past Surgical History  Procedure Laterality Date  . Cesarean section      x3  . Tubal ligation    . Hysteroscopy      w/ D&C with excision of endometrial polyps  . Left great toenail removal  Left 2013    done by [facility]  . Tonsillectomy Bilateral   . Amputation Left 12/24/2012    Procedure: AMPUTATION DIGIT left great toe;  Surgeon: Sherren Kerns, MD;  Location: [facility] OR;  Service: Vascular;  Laterality: Left;  GREAT  . Abdominal aortagram N/A 07/25/2014    Procedure: ABDOMINAL Ronny Flurry;  Surgeon: Sherren Kerns, MD;  Location: [facility] CATH LAB;  Service: Cardiovascular;  Laterality: N/A;  . Peripheral vascular catheterization N/A 05/15/2015    Procedure: Abdominal Aortogram;  Surgeon: Sherren Kerns, MD;  Location: [facility] INVASIVE CV LAB;  Service: Cardiovascular;  Laterality: N/A;  . Colonoscopy    . Amputation Right 05/19/2015   Procedure: AMPUTATION DIGIT;  Surgeon: Sherren Kerns, MD;  Location: [facility] OR;  Service: Vascular;  Laterality: Right;  . Amputation Right 07/22/2015    Procedure: RAY AMPUTATION OF RIGHT GREAT TOE AMPUTATION SITE;  Surgeon: Sherren Kerns, MD;  Location: [facility] OR;  Service: Vascular;  Laterality: Right;  . Irrigation and debridement foot Right 10/26/2015  . I&d extremity Right 10/26/2015    Procedure: IRRIGATION AND DEBRIDEMENT EXTREMITY/RIGHT FOOT;  Surgeon: Fransisco Hertz, MD;  Location: [facility] OR;  Service: Vascular;  Laterality: Right;  . I&d extremity Right 10/29/2015    Procedure: IRRIGATION AND DEBRIDEMENT RIGHT FOOT WITH ACELL AND VAC PLACEMENT;  Surgeon: Alena Bills Markanthony Gedney, DO;  Location: [facility] OR;  Service: Plastics;  Laterality: Right;  . I&d extremity Right 11/12/2015    Procedure: IRRIGATION AND DEBRIDEMENT RIGHT EXTREMITY;  Surgeon: Alena Bills Jacobo Moncrief, DO;  Location: [facility] OR;  Service: Plastics;  Laterality: Right;  . Application of a-cell of extremity Right 11/12/2015    Procedure: INTEGRA;  Surgeon: Alena Bills Rainna Nearhood, DO;  Location: [facility] OR;  Service: Plastics;  Laterality: Right;  . Minor application of wound vac Right 11/12/2015    Procedure: MINOR APPLICATION OF WOUND VAC;  Surgeon: Alena Bills Uthman Mroczkowski, DO;  Location: [facility] OR;  Service: Plastics;  Laterality: Right;  Family History  Problem Relation Age of Onset  . Diabetes Mother   . Hypertension Mother   . Diabetes Father   . Heart disease Father   . Hypertension Sister   . Heart disease Sister    Social History:  reports that she has never smoked. She has never used smokeless tobacco. She reports that she drinks alcohol. She reports that she does not use illicit drugs.  Allergies:  Allergies  Allergen Reactions  . Clindamycin/Lincomycin Itching and Rash    Possible reaction (took clindamycin and cipro at the same time)  . Doxycycline Nausea Only  . Percocet [Oxycodone-Acetaminophen] Itching  . Mycophenolate Diarrhea  .  Ciprofloxacin Itching and Rash    Possible reaction (took clindamycin and cipro at the same time)      (Not in a hospital admission)  No results found for this or any previous visit (from the past 48 hour(s)). No results found.  Review of Systems  Constitutional: Negative.   HENT: Negative.   Eyes: Negative.   Respiratory: Negative.   Cardiovascular: Negative.   Gastrointestinal: Negative.   Genitourinary: Negative.   Musculoskeletal: Negative.   Skin: Negative.   Neurological: Negative.   Psychiatric/Behavioral: Negative.     Last menstrual period 06/20/2012. Physical Exam  Constitutional: She is oriented to person, place, and time. She appears well-developed and well-nourished.  HENT:  Head: Normocephalic and atraumatic.  Eyes: Conjunctivae and EOM are normal. Pupils are equal, round, and reactive to light.  Cardiovascular: Normal rate.   Respiratory: Effort normal.  GI: Soft.  Neurological: She is alert and oriented to person, place, and time.  Skin: Skin is warm.  Psychiatric: She has a normal mood and affect. Her behavior is normal. Judgment and thought content normal.     Assessment/Plan Recommend further debridement and split thickness skin graft placement, possible Acell placement on donor site.  Allison Form, DO 11/27/2015, 11:58 AM

## 2015-11-30 ENCOUNTER — Encounter (HOSPITAL_COMMUNITY): Payer: Self-pay

## 2015-11-30 ENCOUNTER — Encounter (HOSPITAL_COMMUNITY)
Admission: RE | Admit: 2015-11-30 | Discharge: 2015-11-30 | Disposition: A | Discharge status: Home / Self Care | Payer: BLUE CROSS/BLUE SHIELD | Source: Ambulatory Visit | Attending: Plastic Surgery | Admitting: Plastic Surgery

## 2015-11-30 VITALS — BP 114/83 | HR 82 | Temp 98.4°F | Resp 18 | Ht 61.0 in | Wt 127.3 lb

## 2015-11-30 DIAGNOSIS — I10 Essential (primary) hypertension: Secondary | ICD-10-CM | POA: Diagnosis not present

## 2015-11-30 DIAGNOSIS — L97512 Non-pressure chronic ulcer of other part of right foot with fat layer exposed: Secondary | ICD-10-CM

## 2015-11-30 DIAGNOSIS — L97519 Non-pressure chronic ulcer of other part of right foot with unspecified severity: Secondary | ICD-10-CM | POA: Diagnosis present

## 2015-11-30 DIAGNOSIS — M069 Rheumatoid arthritis, unspecified: Secondary | ICD-10-CM | POA: Diagnosis not present

## 2015-11-30 DIAGNOSIS — Z885 Allergy status to narcotic agent status: Secondary | ICD-10-CM | POA: Diagnosis not present

## 2015-11-30 DIAGNOSIS — M797 Fibromyalgia: Secondary | ICD-10-CM | POA: Diagnosis not present

## 2015-11-30 DIAGNOSIS — Z89431 Acquired absence of right foot: Secondary | ICD-10-CM | POA: Diagnosis not present

## 2015-11-30 LAB — BASIC METABOLIC PANEL
ANION GAP: 8 (ref 5–15)
BUN: 12 mg/dL (ref 6–20)
CO2: 26 mmol/L (ref 22–32)
Calcium: 8.9 mg/dL (ref 8.9–10.3)
Chloride: 107 mmol/L (ref 101–111)
Creatinine, Ser: 0.68 mg/dL (ref 0.44–1.00)
GFR calc Af Amer: >60 mL/min (ref >60)
Glucose, Bld: 107 mg/dL — ABNORMAL HIGH (ref 65–99)
POTASSIUM: 4.4 mmol/L (ref 3.5–5.1)
SODIUM: 141 mmol/L (ref 135–145)

## 2015-11-30 LAB — CBC
HCT: 39.1 % (ref 36.0–46.0)
Hemoglobin: 12.1 g/dL (ref 12.0–15.0)
MCH: 28 pg (ref 26.0–34.0)
MCHC: 30.9 g/dL (ref 30.0–36.0)
MCV: 90.5 fL (ref 78.0–100.0)
PLATELETS: 254 10*3/uL (ref 150–400)
RBC: 4.32 MIL/uL (ref 3.87–5.11)
RDW: 17.2 % — ABNORMAL HIGH (ref 11.5–15.5)
WBC: 8 10*3/uL (ref 4.0–10.5)

## 2015-11-30 LAB — GLUCOSE, CAPILLARY: Glucose-Capillary: 83 mg/dL (ref 65–99)

## 2015-11-30 NOTE — Pre-Procedure Instructions (Signed)
    ANNALYNN CENTANNI  11/30/2015      [facility] [address] - [place], [place] - [address] [place] [address] Phone: [phone] Fax: [phone]  [facility] [phone] - [place], [place] - [address] [place] [address] Phone: [phone] Fax: [phone]    Your procedure is scheduled on 12/02/15.  Report to [facility] Admitting at 1030 A.M.  Call this number if you have problems the morning of surgery:  [phone]   Remember:  Do not eat food or drink liquids after midnight.  Take these medicines the morning of surgery with A SIP OF WATER- xanax,norvasc,lexapro,hydrocodone,prilosec   Do not wear jewelry, make-up or nail polish.  Do not wear lotions, powders, or perfumes.  You may wear deodorant.  Do not shave 48 hours prior to surgery.  Men may shave face and neck.  Do not bring valuables to the hospital.  [facility] is not responsible for any belongings or valuables.  Contacts, dentures or bridgework may not be worn into surgery.  Leave your suitcase in the car.  After surgery it may be brought to your room.  For patients admitted to the hospital, discharge time will be determined by your treatment team.  Patients discharged the day of surgery will not be allowed to drive home.   Name and phone number of your driver:   Special instructions:    Please read over the following fact sheets that you were given. Pain Booklet, Coughing and Deep Breathing and Surgical Site Infection Prevention

## 2015-12-01 MED ORDER — CEFAZOLIN SODIUM-DEXTROSE 2-3 GM-% IV SOLR
2.0000 g | INTRAVENOUS | Status: AC
  Administered 2015-12-02: 2 g via INTRAVENOUS

## 2015-12-02 ENCOUNTER — Ambulatory Visit (HOSPITAL_COMMUNITY): Payer: BLUE CROSS/BLUE SHIELD | Admitting: Anesthesiology

## 2015-12-02 ENCOUNTER — Ambulatory Visit (HOSPITAL_COMMUNITY)
Admission: RE | Admit: 2015-12-02 | Discharge: 2015-12-02 | Disposition: A | Discharge status: Skilled Nursing Facility | Payer: BLUE CROSS/BLUE SHIELD | Source: Ambulatory Visit | Attending: Plastic Surgery | Admitting: Plastic Surgery

## 2015-12-02 ENCOUNTER — Encounter (HOSPITAL_COMMUNITY): Payer: Self-pay | Admitting: *Deleted

## 2015-12-02 ENCOUNTER — Encounter (HOSPITAL_COMMUNITY)
Admission: RE | Disposition: A | Discharge status: Skilled Nursing Facility | Payer: Self-pay | Source: Ambulatory Visit | Attending: Plastic Surgery

## 2015-12-02 DIAGNOSIS — L97519 Non-pressure chronic ulcer of other part of right foot with unspecified severity: Secondary | ICD-10-CM | POA: Insufficient documentation

## 2015-12-02 DIAGNOSIS — I10 Essential (primary) hypertension: Secondary | ICD-10-CM | POA: Insufficient documentation

## 2015-12-02 DIAGNOSIS — L97512 Non-pressure chronic ulcer of other part of right foot with fat layer exposed: Secondary | ICD-10-CM

## 2015-12-02 DIAGNOSIS — M069 Rheumatoid arthritis, unspecified: Secondary | ICD-10-CM | POA: Insufficient documentation

## 2015-12-02 DIAGNOSIS — M797 Fibromyalgia: Secondary | ICD-10-CM | POA: Insufficient documentation

## 2015-12-02 DIAGNOSIS — Z885 Allergy status to narcotic agent status: Secondary | ICD-10-CM | POA: Insufficient documentation

## 2015-12-02 DIAGNOSIS — Z89431 Acquired absence of right foot: Secondary | ICD-10-CM | POA: Insufficient documentation

## 2015-12-02 HISTORY — PX: SKIN SPLIT GRAFT: SHX444

## 2015-12-02 LAB — GLUCOSE, CAPILLARY
GLUCOSE-CAPILLARY: 119 mg/dL — AB (ref 65–99)
GLUCOSE-CAPILLARY: 85 mg/dL (ref 65–99)
Glucose-Capillary: 61 mg/dL — ABNORMAL LOW (ref 65–99)

## 2015-12-02 SURGERY — APPLICATION, GRAFT, SKIN, SPLIT-THICKNESS
Anesthesia: General | Site: Foot | Laterality: Right

## 2015-12-02 MED ORDER — SODIUM CHLORIDE 0.9 % IR SOLN
Status: DC | PRN
  Administered 2015-12-02: 500 mL

## 2015-12-02 MED ORDER — SUCCINYLCHOLINE CHLORIDE 20 MG/ML IJ SOLN
INTRAMUSCULAR | Status: AC
  Filled 2015-12-02: qty 2

## 2015-12-02 MED ORDER — LACTATED RINGERS IV SOLN
INTRAVENOUS | Status: DC
  Administered 2015-12-02: 50 mL/h via INTRAVENOUS

## 2015-12-02 MED ORDER — LIDOCAINE HCL (CARDIAC) 20 MG/ML IV SOLN
INTRAVENOUS | Status: DC | PRN
  Administered 2015-12-02: 40 mg via INTRAVENOUS

## 2015-12-02 MED ORDER — FENTANYL CITRATE (PF) 100 MCG/2ML IJ SOLN
INTRAMUSCULAR | Status: DC | PRN
  Administered 2015-12-02: 50 ug via INTRAVENOUS

## 2015-12-02 MED ORDER — HYDROMORPHONE HCL 1 MG/ML IJ SOLN
INTRAMUSCULAR | Status: AC
  Administered 2015-12-02: 0.5 mg via INTRAVENOUS
  Filled 2015-12-02: qty 1

## 2015-12-02 MED ORDER — MIDAZOLAM HCL 5 MG/5ML IJ SOLN
INTRAMUSCULAR | Status: DC | PRN
  Administered 2015-12-02: 2 mg via INTRAVENOUS

## 2015-12-02 MED ORDER — 0.9 % SODIUM CHLORIDE (POUR BTL) OPTIME
TOPICAL | Status: DC | PRN
  Administered 2015-12-02: 1000 mL

## 2015-12-02 MED ORDER — ONDANSETRON HCL 4 MG/2ML IJ SOLN
INTRAMUSCULAR | Status: DC | PRN
  Administered 2015-12-02: 4 mg via INTRAVENOUS

## 2015-12-02 MED ORDER — DEXTROSE 50 % IV SOLN
INTRAVENOUS | Status: AC
  Filled 2015-12-02: qty 50

## 2015-12-02 MED ORDER — HYDROMORPHONE HCL 1 MG/ML IJ SOLN
0.2500 mg | INTRAMUSCULAR | Status: DC | PRN
  Administered 2015-12-02 (×4): 0.5 mg via INTRAVENOUS

## 2015-12-02 MED ORDER — SUCCINYLCHOLINE CHLORIDE 20 MG/ML IJ SOLN
INTRAMUSCULAR | Status: DC | PRN
  Administered 2015-12-02: 80 mg via INTRAVENOUS

## 2015-12-02 MED ORDER — EPINEPHRINE HCL (NASAL) 0.1 % NA SOLN
NASAL | Status: AC
  Filled 2015-12-02: qty 30

## 2015-12-02 MED ORDER — DEXTROSE 50 % IV SOLN
25.0000 mL | Freq: Once | INTRAVENOUS | Status: AC
  Administered 2015-12-02: 25 mL via INTRAVENOUS

## 2015-12-02 MED ORDER — MIDAZOLAM HCL 2 MG/2ML IJ SOLN
INTRAMUSCULAR | Status: AC
  Filled 2015-12-02: qty 2

## 2015-12-02 MED ORDER — PROPOFOL 10 MG/ML IV BOLUS
INTRAVENOUS | Status: DC | PRN
  Administered 2015-12-02: 120 mg via INTRAVENOUS

## 2015-12-02 MED ORDER — FENTANYL CITRATE (PF) 250 MCG/5ML IJ SOLN
INTRAMUSCULAR | Status: AC
  Filled 2015-12-02: qty 5

## 2015-12-02 MED ORDER — BUPIVACAINE-EPINEPHRINE (PF) 0.25% -1:200000 IJ SOLN
INTRAMUSCULAR | Status: AC
  Filled 2015-12-02: qty 30

## 2015-12-02 SURGICAL SUPPLY — 29 items
BAG DECANTER FOR FLEXI CONT (MISCELLANEOUS) ×1 IMPLANT
BANDAGE ELASTIC 4 VELCRO ST LF (GAUZE/BANDAGES/DRESSINGS) ×1 IMPLANT
BNDG GAUZE ELAST 4 BULKY (GAUZE/BANDAGES/DRESSINGS) ×1 IMPLANT
COVER SURGICAL LIGHT HANDLE (MISCELLANEOUS) ×2 IMPLANT
DRAPE INCISE IOBAN 66X45 STRL (DRAPES) ×1 IMPLANT
DRAPE ORTHO SPLIT 77X108 STRL (DRAPES) ×4
DRAPE PROXIMA HALF (DRAPES) ×1 IMPLANT
DRAPE SURG ORHT 6 SPLT 77X108 (DRAPES) ×2 IMPLANT
DRSG VAC ATS SM SENSATRAC (GAUZE/BANDAGES/DRESSINGS) ×1 IMPLANT
ELECT REM PT RETURN 9FT ADLT (ELECTROSURGICAL) ×2
ELECTRODE REM PT RTRN 9FT ADLT (ELECTROSURGICAL) IMPLANT
GLOVE BIO SURGEON STRL SZ 6.5 (GLOVE) ×4 IMPLANT
GOWN STRL REUS W/ TWL LRG LVL3 (GOWN DISPOSABLE) ×2 IMPLANT
GOWN STRL REUS W/TWL LRG LVL3 (GOWN DISPOSABLE) ×4
KIT BASIN OR (CUSTOM PROCEDURE TRAY) ×2 IMPLANT
KIT ROOM TURNOVER OR (KITS) ×2 IMPLANT
MATRIX WOUND MESHED 2X2 (Tissue) IMPLANT
NS IRRIG 1000ML POUR BTL (IV SOLUTION) ×2 IMPLANT
PACK GENERAL/GYN (CUSTOM PROCEDURE TRAY) ×2 IMPLANT
PAD ARMBOARD 7.5X6 YLW CONV (MISCELLANEOUS) ×3 IMPLANT
SUT CHROMIC 4 0 PS 2 18 (SUTURE) IMPLANT
SUT SILK 4 0 PS 2 (SUTURE) IMPLANT
SUT VIC AB 5-0 P-3 18XBRD (SUTURE) IMPLANT
SUT VIC AB 5-0 P3 18 (SUTURE) ×2
SUT VIC AB 5-0 PS2 18 (SUTURE) ×1 IMPLANT
TOWEL OR 17X24 6PK STRL BLUE (TOWEL DISPOSABLE) ×2 IMPLANT
TOWEL OR 17X26 10 PK STRL BLUE (TOWEL DISPOSABLE) ×2 IMPLANT
UNDERPAD 30X30 INCONTINENT (UNDERPADS AND DIAPERS) ×2 IMPLANT
WOUND MATRIX MESHED 2X2 (Tissue) ×1 IMPLANT

## 2015-12-02 NOTE — Progress Notes (Signed)
Orthopedic Tech Progress Note Patient Details:  Allison Beasley [birth year] [id number] Applied CAM walker to RLE. Ortho Devices Type of Ortho Device: CAM walker Ortho Device/Splint Location: RLE Ortho Device/Splint Interventions: Application   Lesle Chris 12/02/2015, 3:44 PM

## 2015-12-02 NOTE — Brief Op Note (Addendum)
12/02/2015  1:03 PM  PATIENT:  Allison Beasley  55 y.o. female  PRE-OPERATIVE DIAGNOSIS:  RIGHT FOOT ULCER  POST-OPERATIVE DIAGNOSIS:  same  PROCEDURE:  Placement of Integra on right foot with the VAC  SURGEON:  Surgeon(s) and Role:    * Keyston Ardolino S Apphia Cropley, DO - Primary  ASSISTANTS: none   ANESTHESIA:   general  EBL:     BLOOD ADMINISTERED:none  DRAINS: none   LOCAL MEDICATIONS USED:  NONE  SPECIMEN:  No Specimen  DISPOSITION OF SPECIMEN:  N/A  COUNTS:  YES  TOURNIQUET:  * No tourniquets in log *  DICTATION: .Dragon Dictation  PLAN OF CARE: discharge  PATIENT DISPOSITION:  PACU - hemodynamically stable.   Delay start of Pharmacological VTE agent (>24hrs) due to surgical blood loss or risk of bleeding: no

## 2015-12-02 NOTE — Anesthesia Preprocedure Evaluation (Addendum)
Anesthesia Evaluation  Patient identified by MRN, date of birth, ID band Patient awake    Reviewed: Allergy & Precautions, H&P , NPO status , Patient's Chart, lab work & pertinent test results  Airway Mallampati: II  TM Distance: >3 FB Neck ROM: Full    Dental no notable dental hx. (+) Teeth Intact, Dental Advisory Given   Pulmonary neg pulmonary ROS,    Pulmonary exam normal breath sounds clear to auscultation       Cardiovascular hypertension, Pt. on medications + Peripheral Vascular Disease   Rhythm:Regular Rate:Normal     Neuro/Psych  Headaches, Anxiety Depression    GI/Hepatic Neg liver ROS, GERD  Medicated and Poorly Controlled,  Endo/Other  diabetes  Renal/GU negative Renal ROS  negative genitourinary   Musculoskeletal  (+) Arthritis , Rheumatoid disorders and steroids,  Fibromyalgia -  Abdominal   Peds  Hematology negative hematology ROS (+)   Anesthesia Other Findings   Reproductive/Obstetrics negative OB ROS                          Anesthesia Physical Anesthesia Plan  ASA: III  Anesthesia Plan: General   Post-op Pain Management:    Induction: Intravenous, Rapid sequence and Cricoid pressure planned  Airway Management Planned: Oral ETT  Additional Equipment:   Intra-op Plan:   Post-operative Plan: Extubation in OR  Informed Consent: I have reviewed the patients History and Physical, chart, labs and discussed the procedure including the risks, benefits and alternatives for the proposed anesthesia with the patient or authorized representative who has indicated his/her understanding and acceptance.   Dental advisory given  Plan Discussed with: CRNA  Anesthesia Plan Comments:        Anesthesia Quick Evaluation

## 2015-12-02 NOTE — Anesthesia Procedure Notes (Addendum)
Procedure Name: Intubation Date/Time: 12/02/2015 1:11 PM Performed by: Roney Mans P Pre-anesthesia Checklist: Patient identified, Timeout performed, Emergency Drugs available, Suction available and Patient being monitored Patient Re-evaluated:Patient Re-evaluated prior to inductionOxygen Delivery Method: Circle system utilized Preoxygenation: Pre-oxygenation with 100% oxygen Intubation Type: IV induction, Cricoid Pressure applied and Rapid sequence Laryngoscope Size: Mac and 3 Grade View: Grade I Tube type: Oral Tube size: 7.0 mm Number of attempts: 1 Airway Equipment and Method: Stylet Placement Confirmation: ETT inserted through vocal cords under direct vision,  breath sounds checked- equal and bilateral and positive ETCO2 Secured at: 22 cm Tube secured with: Tape Dental Injury: Teeth and Oropharynx as per pre-operative assessment  Comments: RSI d/t pt c/o worsening acid reflux

## 2015-12-02 NOTE — Anesthesia Postprocedure Evaluation (Signed)
Anesthesia Post Note  Patient: Allison Beasley  Procedure(s) Performed: Procedure(s) (LRB): SKIN GRAFT SPLIT THICKNESS RIGHT FOOT WITH PLACEMENT VAC DRESSING (Right)  Patient location during evaluation: PACU Anesthesia Type: General Level of consciousness: awake and alert Pain management: pain level controlled Vital Signs Assessment: post-procedure vital signs reviewed and stable Respiratory status: spontaneous breathing, nonlabored ventilation and respiratory function stable Cardiovascular status: blood pressure returned to baseline and stable Postop Assessment: no signs of nausea or vomiting Anesthetic complications: no    Last Vitals:  Filed Vitals:   12/02/15 1425 12/02/15 1440  BP: 121/75 121/78  Pulse: 82 84  Temp:    Resp: 18 10    Last Pain:  Filed Vitals:   12/02/15 1448  PainSc: 6                  Geoffry Bannister,W. EDMOND

## 2015-12-02 NOTE — H&P (View-Only) (Signed)
Allison Beasley is an 55 y.o. female.   Chief Complaint: right foot ulcer HPI: The patient is a 55 yrs old bf here for treatment of her right foot ulcer.  She has a past medical history of osteoarthritis, Raynaud phenomenon, GERD, Anemia, Hypertension, Peripheral vascular disease, Depression,Type II diabetes mellitus, Polymyositis.  She has undergone debridement of the area with Acell and VAC dressing placement (10/29/15 and 11/12/15).   She is overall doing well. There is no sign of infection.    Past Medical History  Diagnosis Date  . Osteopenia   . Raynaud phenomenon   . Constipation   . GERD (gastroesophageal reflux disease)   . Headache(784.0)   . Anemia   . Hypertension   . Seizures (HCC)     with chid birth 38f 2 childre  . Peripheral vascular disease (HCC)   . Depression   . Anxiety   . Polymyositis (HCC)   . Fibromyalgia   . Pneumonia 03/2012  . Polymyositis (HCC)   . Shortness of breath     with exertion  . Type II diabetes mellitus (HCC)     on no medications  . Osteoarthritis   . Rheumatoid arthritis (HCC)   . Polymyositis [facility])     Past Surgical History  Procedure Laterality Date  . Cesarean section      x3  . Tubal ligation    . Hysteroscopy      w/ D&C with excision of endometrial polyps  . Left great toenail removal  Left 2013    done by [place] [facility]  . Tonsillectomy Bilateral   . Amputation Left 12/24/2012    Procedure: AMPUTATION DIGIT left great toe;  Surgeon: Sherren Kerns, MD;  Location: [facility] OR;  Service: Vascular;  Laterality: Left;  GREAT  . Abdominal aortagram N/A 07/25/2014    Procedure: ABDOMINAL Ronny Flurry;  Surgeon: Sherren Kerns, MD;  Location: [facility] CATH LAB;  Service: Cardiovascular;  Laterality: N/A;  . Peripheral vascular catheterization N/A 05/15/2015    Procedure: Abdominal Aortogram;  Surgeon: Sherren Kerns, MD;  Location: [facility] INVASIVE CV LAB;  Service: Cardiovascular;  Laterality: N/A;  . Colonoscopy    . Amputation Right 05/19/2015   Procedure: AMPUTATION DIGIT;  Surgeon: Sherren Kerns, MD;  Location: [facility] OR;  Service: Vascular;  Laterality: Right;  . Amputation Right 07/22/2015    Procedure: RAY AMPUTATION OF RIGHT GREAT TOE AMPUTATION SITE;  Surgeon: Sherren Kerns, MD;  Location: [facility] OR;  Service: Vascular;  Laterality: Right;  . Irrigation and debridement foot Right 10/26/2015  . I&d extremity Right 10/26/2015    Procedure: IRRIGATION AND DEBRIDEMENT EXTREMITY/RIGHT FOOT;  Surgeon: Fransisco Hertz, MD;  Location: [facility] OR;  Service: Vascular;  Laterality: Right;  . I&d extremity Right 10/29/2015    Procedure: IRRIGATION AND DEBRIDEMENT RIGHT FOOT WITH ACELL AND VAC PLACEMENT;  Surgeon: Alena Bills Dillingham, DO;  [facility] OR;  Service: Plastics;  Laterality: Right;  . I&d extremity Right 11/12/2015    Procedure: IRRIGATION AND DEBRIDEMENT RIGHT EXTREMITY;  Surgeon: Alena Bills Dillingham, DO;  Location: [facility] OR;  Service: Plastics;  Laterality: Right;  . Application of a-cell of extremity Right 11/12/2015    Procedure: INTEGRA;  Surgeon: Alena Bills Dillingham, DO;  Location: [facility] OR;  Service: Plastics;  Laterality: Right;  . Minor application of wound vac Right 11/12/2015    Procedure: MINOR APPLICATION OF WOUND VAC;  Surgeon: Alena Bills Dillingham, DO;  Location: [facility] OR;  Service: Plastics;  Laterality: Right;  Family History  Problem Relation Age of Onset  . Diabetes Mother   . Hypertension Mother   . Diabetes Father   . Heart disease Father   . Hypertension Sister   . Heart disease Sister    Social History:  reports that she has never smoked. She has never used smokeless tobacco. She reports that she drinks alcohol. She reports that she does not use illicit drugs.  Allergies:  Allergies  Allergen Reactions  . Clindamycin/Lincomycin Itching and Rash    Possible reaction (took clindamycin and cipro at the same time)  . Doxycycline Nausea Only  . Percocet [Oxycodone-Acetaminophen] Itching  . Mycophenolate Diarrhea  .  Ciprofloxacin Itching and Rash    Possible reaction (took clindamycin and cipro at the same time)      (Not in a hospital admission)  No results found for this or any previous visit (from the past 48 hour(s)). No results found.  Review of Systems  Constitutional: Negative.   HENT: Negative.   Eyes: Negative.   Respiratory: Negative.   Cardiovascular: Negative.   Gastrointestinal: Negative.   Genitourinary: Negative.   Musculoskeletal: Negative.   Skin: Negative.   Neurological: Negative.   Psychiatric/Behavioral: Negative.     Last menstrual period 06/20/2012. Physical Exam  Constitutional: She is oriented to person, place, and time. She appears well-developed and well-nourished.  HENT:  Head: Normocephalic and atraumatic.  Eyes: Conjunctivae and EOM are normal. Pupils are equal, round, and reactive to light.  Cardiovascular: Normal rate.   Respiratory: Effort normal.  GI: Soft.  Neurological: She is alert and oriented to person, place, and time.  Skin: Skin is warm.  Psychiatric: She has a normal mood and affect. Her behavior is normal. Judgment and thought content normal.     Assessment/Plan Recommend further debridement and split thickness skin graft placement, possible Acell placement on donor site.  CLAIRE S DILLINGHAM, DO 11/27/2015, 11:58 AM    

## 2015-12-02 NOTE — Op Note (Signed)
Operative Note   DATE OF OPERATION: 12/02/2015  LOCATION: [facility]patient  SURGICAL DIVISION: Plastic Surgery  PREOPERATIVE DIAGNOSES:  Right foot ulcer  POSTOPERATIVE DIAGNOSES:  same  PROCEDURE:  Preparation of right foot ulcer (3.5 x 4.0 x .8 cm) for placement of Integral (sheet 5 x 5) and VAC dressing  SURGEON: Cattleya Dobratz Sanger, DO  ANESTHESIA:  General.   COMPLICATIONS: None.   INDICATIONS FOR PROCEDURE:  The patient, Allison Beasley is a 55 y.o. female born on Feb 03, 1961, is here for treatment of a large ulcer of the right foot at the plantar area.  She had an abscess that was drained and now has a wound. She has multiple medical conditions that make healing a challenge. MRN: [id number]  CONSENT:  Informed consent was obtained directly from the patient. Risks, benefits and alternatives were fully discussed. Specific risks including but not limited to bleeding, infection, hematoma, seroma, scarring, pain, infection, contracture, asymmetry, wound healing problems, and need for further surgery were all discussed. The patient did have an ample opportunity to have questions answered to satisfaction.   DESCRIPTION OF PROCEDURE:  The patient was taken to the operating room. SCD was placed on the left leg and IV antibiotics were given. The patient's operative site was prepped and draped in a sterile fashion. A time out was performed and all information was confirmed to be correct.  General anesthesia was administered.  The area was irrigated with antibiotic solution and saline.  Debridement was done with a #10 blade of the skin for the 3.5 x 4.0 cm wound.  The Integra sheet 3.5 x 4 cm was applied and secured with 5-0 Vicryl.  There is still tendon exposed. The VAC was applied.  The leg was wrapped with kerlex and an ace wrap. The patient tolerated the procedure well.  There were no complications. The patient was allowed to wake from anesthesia, extubated and taken to the recovery room in  satisfactory condition.

## 2015-12-02 NOTE — Transfer of Care (Signed)
Immediate Anesthesia Transfer of Care Note  Patient: Allison Beasley  Procedure(s) Performed: Procedure(s): SKIN GRAFT SPLIT THICKNESS RIGHT FOOT WITH PLACEMENT VAC DRESSING (Right)  Patient Location: PACU  Anesthesia Type:General  Level of Consciousness: awake, alert , oriented and patient cooperative  Airway & Oxygen Therapy: Patient Spontanous Breathing and Patient connected to nasal cannula oxygen  Post-op Assessment: Report given to RN, Post -op Vital signs reviewed and stable and Patient moving all extremities  Post vital signs: Reviewed and stable  Last Vitals:  BP 129/88 HR 97 RR 12 SpO2 100% on 2L [place] Resting comfortably, maintains good airway, denies pain.   Complications: No apparent anesthesia complications

## 2015-12-02 NOTE — Interval H&P Note (Signed)
History and Physical Interval Note:  12/02/2015 10:38 AM  Allison Beasley  has presented today for surgery, with the diagnosis of RIGHT FOOT ULCER  The various methods of treatment have been discussed with the patient and family. After consideration of risks, benefits and other options for treatment, the patient has consented to  Procedure(s): SKIN GRAFT SPLIT THICKNESS RIGHT FOOT WITH PLACEMENT VAC DRESSING (Right) as a surgical intervention .  The patient's history has been reviewed, patient examined, no change in status, stable for surgery.  I have reviewed the patient's chart and labs.  Questions were answered to the patient's satisfaction.     Peggye Form

## 2015-12-03 ENCOUNTER — Encounter (HOSPITAL_COMMUNITY): Payer: Self-pay | Admitting: Plastic Surgery

## 2015-12-04 ENCOUNTER — Encounter (HOSPITAL_COMMUNITY): Payer: Self-pay | Admitting: Plastic Surgery

## 2016-10-24 DIAGNOSIS — A419 Sepsis, unspecified organism: Secondary | ICD-10-CM

## 2016-10-24 DIAGNOSIS — J849 Interstitial pulmonary disease, unspecified: Secondary | ICD-10-CM

## 2016-10-24 DIAGNOSIS — A492 Hemophilus influenzae infection, unspecified site: Secondary | ICD-10-CM

## 2016-10-24 DIAGNOSIS — L8990 Pressure ulcer of unspecified site, unspecified stage: Secondary | ICD-10-CM

## 2016-10-24 HISTORY — DX: Pressure ulcer of unspecified site, unspecified stage: L89.90

## 2016-10-24 HISTORY — DX: Sepsis, unspecified organism: A41.9

## 2016-10-24 HISTORY — DX: Hemophilus influenzae infection, unspecified site: A49.2

## 2016-10-24 HISTORY — DX: Interstitial pulmonary disease, unspecified: J84.9

## 2017-01-18 ENCOUNTER — Other Ambulatory Visit (HOSPITAL_COMMUNITY): Payer: Self-pay

## 2017-01-18 ENCOUNTER — Inpatient Hospital Stay
Admission: AD | Admit: 2017-01-18 | Discharge: 2017-02-28 | Disposition: A | Discharge status: Skilled Nursing Facility | Payer: Self-pay | Source: Ambulatory Visit | Attending: Internal Medicine | Admitting: Internal Medicine

## 2017-01-18 DIAGNOSIS — Z452 Encounter for adjustment and management of vascular access device: Secondary | ICD-10-CM

## 2017-01-18 DIAGNOSIS — J189 Pneumonia, unspecified organism: Secondary | ICD-10-CM

## 2017-01-18 DIAGNOSIS — K567 Ileus, unspecified: Secondary | ICD-10-CM

## 2017-01-18 DIAGNOSIS — J9690 Respiratory failure, unspecified, unspecified whether with hypoxia or hypercapnia: Secondary | ICD-10-CM

## 2017-01-18 DIAGNOSIS — Z4659 Encounter for fitting and adjustment of other gastrointestinal appliance and device: Secondary | ICD-10-CM

## 2017-01-19 LAB — COMPREHENSIVE METABOLIC PANEL
ALBUMIN: 2.2 g/dL — AB (ref 3.5–5.0)
ALBUMIN: 2 g/dL — AB (ref 3.5–5.0)
ALK PHOS: 215 U/L — AB (ref 38–126)
ALT: 55 U/L — ABNORMAL HIGH (ref 14–54)
ALT: 55 U/L — AB (ref 14–54)
ANION GAP: 13 (ref 5–15)
AST: 50 U/L — ABNORMAL HIGH (ref 15–41)
AST: 43 U/L — AB (ref 15–41)
Alkaline Phosphatase: 205 U/L — ABNORMAL HIGH (ref 38–126)
Anion gap: 9 (ref 5–15)
BUN: 18 mg/dL (ref 6–20)
BUN: 15 mg/dL (ref 6–20)
CALCIUM: 8.6 mg/dL — AB (ref 8.9–10.3)
CHLORIDE: 103 mmol/L (ref 101–111)
CHLORIDE: 103 mmol/L (ref 101–111)
CO2: 20 mmol/L — AB (ref 22–32)
CO2: 21 mmol/L — AB (ref 22–32)
CREATININE: 0.48 mg/dL (ref 0.44–1.00)
Calcium: 8.6 mg/dL — ABNORMAL LOW (ref 8.9–10.3)
Creatinine, Ser: 0.52 mg/dL (ref 0.44–1.00)
GFR calc Af Amer: >60 mL/min (ref >60)
GFR calc Af Amer: >60 mL/min (ref >60)
GFR calc non Af Amer: >60 mL/min (ref >60)
GFR calc non Af Amer: >60 mL/min (ref >60)
GLUCOSE: 179 mg/dL — AB (ref 65–99)
GLUCOSE: 175 mg/dL — AB (ref 65–99)
POTASSIUM: 4.7 mmol/L (ref 3.5–5.1)
Potassium: 5.1 mmol/L (ref 3.5–5.1)
SODIUM: 136 mmol/L (ref 135–145)
SODIUM: 133 mmol/L — AB (ref 135–145)
Total Bilirubin: 0.7 mg/dL (ref 0.3–1.2)
Total Bilirubin: 0.9 mg/dL (ref 0.3–1.2)
Total Protein: 7.5 g/dL (ref 6.5–8.1)
Total Protein: 7.4 g/dL (ref 6.5–8.1)

## 2017-01-19 LAB — C DIFFICILE QUICK SCREEN W PCR REFLEX
C Diff antigen: NEGATIVE
C Diff interpretation: NOT DETECTED
C Diff toxin: NEGATIVE

## 2017-01-19 LAB — URINALYSIS, ROUTINE W REFLEX MICROSCOPIC
BACTERIA UA: NONE SEEN
Bilirubin Urine: NEGATIVE
Glucose, UA: 150 mg/dL — AB
Hgb urine dipstick: NEGATIVE
Ketones, ur: NEGATIVE mg/dL
NITRITE: NEGATIVE
PH: 8 (ref 5.0–8.0)
Protein, ur: NEGATIVE mg/dL
SPECIFIC GRAVITY, URINE: 1.01 (ref 1.005–1.030)
SQUAMOUS EPITHELIAL / LPF: NONE SEEN

## 2017-01-19 LAB — CBC
HCT: 31.4 % — ABNORMAL LOW (ref 36.0–46.0)
Hemoglobin: 9.5 g/dL — ABNORMAL LOW (ref 12.0–15.0)
MCH: 29.4 pg (ref 26.0–34.0)
MCHC: 30.3 g/dL (ref 30.0–36.0)
MCV: 97.2 fL (ref 78.0–100.0)
Platelets: 449 K/uL — ABNORMAL HIGH (ref 150–400)
RBC: 3.23 MIL/uL — ABNORMAL LOW (ref 3.87–5.11)
RDW: 21.1 % — ABNORMAL HIGH (ref 11.5–15.5)
WBC: 21 K/uL — ABNORMAL HIGH (ref 4.0–10.5)

## 2017-01-19 LAB — PROCALCITONIN

## 2017-01-19 LAB — SEDIMENTATION RATE
Sed Rate: 134 mm/h — ABNORMAL HIGH (ref 0–22)
Sed Rate: 135 mm/hr — ABNORMAL HIGH (ref 0–22)

## 2017-01-19 LAB — PROTIME-INR
INR: 1.02
Prothrombin Time: 13.4 seconds (ref 11.4–15.2)

## 2017-01-20 ENCOUNTER — Other Ambulatory Visit (HOSPITAL_COMMUNITY): Payer: Self-pay

## 2017-01-21 LAB — URINE CULTURE

## 2017-01-21 LAB — PROCALCITONIN

## 2017-01-22 ENCOUNTER — Other Ambulatory Visit (HOSPITAL_COMMUNITY): Payer: Self-pay

## 2017-01-22 LAB — BASIC METABOLIC PANEL
Anion gap: 10 (ref 5–15)
BUN: 8 mg/dL (ref 6–20)
CALCIUM: 8.9 mg/dL (ref 8.9–10.3)
CO2: 19 mmol/L — AB (ref 22–32)
CREATININE: 0.44 mg/dL (ref 0.44–1.00)
Chloride: 106 mmol/L (ref 101–111)
GFR calc non Af Amer: >60 mL/min (ref >60)
GLUCOSE: 96 mg/dL (ref 65–99)
Potassium: 4.6 mmol/L (ref 3.5–5.1)
Sodium: 135 mmol/L (ref 135–145)

## 2017-01-22 LAB — MAGNESIUM: Magnesium: 1.8 mg/dL (ref 1.7–2.4)

## 2017-01-23 LAB — PROCALCITONIN: Procalcitonin: <0.1 ng/mL

## 2017-01-26 LAB — BASIC METABOLIC PANEL
Anion gap: 11 (ref 5–15)
BUN: 16 mg/dL (ref 6–20)
CHLORIDE: 107 mmol/L (ref 101–111)
CO2: 20 mmol/L — AB (ref 22–32)
CREATININE: 0.53 mg/dL (ref 0.44–1.00)
Calcium: 9.1 mg/dL (ref 8.9–10.3)
GFR calc Af Amer: >60 mL/min (ref >60)
GFR calc non Af Amer: >60 mL/min (ref >60)
GLUCOSE: 75 mg/dL (ref 65–99)
Potassium: 4.1 mmol/L (ref 3.5–5.1)
SODIUM: 138 mmol/L (ref 135–145)

## 2017-01-26 LAB — CBC
HEMATOCRIT: 39.3 % (ref 36.0–46.0)
HEMOGLOBIN: 12.3 g/dL (ref 12.0–15.0)
MCH: 29.6 pg (ref 26.0–34.0)
MCHC: 31.3 g/dL (ref 30.0–36.0)
MCV: 94.5 fL (ref 78.0–100.0)
Platelets: 341 10*3/uL (ref 150–400)
RBC: 4.16 MIL/uL (ref 3.87–5.11)
RDW: 19.2 % — ABNORMAL HIGH (ref 11.5–15.5)
WBC: 17.4 10*3/uL — ABNORMAL HIGH (ref 4.0–10.5)

## 2017-01-31 ENCOUNTER — Other Ambulatory Visit (HOSPITAL_COMMUNITY): Payer: Self-pay

## 2017-01-31 LAB — CBC
HEMATOCRIT: 40.5 % (ref 36.0–46.0)
HEMOGLOBIN: 12.7 g/dL (ref 12.0–15.0)
MCH: 30.1 pg (ref 26.0–34.0)
MCHC: 31.4 g/dL (ref 30.0–36.0)
MCV: 96 fL (ref 78.0–100.0)
Platelets: 232 10*3/uL (ref 150–400)
RBC: 4.22 MIL/uL (ref 3.87–5.11)
RDW: 18.9 % — AB (ref 11.5–15.5)
WBC: 27.9 10*3/uL — ABNORMAL HIGH (ref 4.0–10.5)

## 2017-01-31 LAB — BASIC METABOLIC PANEL
Anion gap: 13 (ref 5–15)
BUN: 20 mg/dL (ref 6–20)
CALCIUM: 9.1 mg/dL (ref 8.9–10.3)
CHLORIDE: 107 mmol/L (ref 101–111)
CO2: 18 mmol/L — AB (ref 22–32)
CREATININE: 0.78 mg/dL (ref 0.44–1.00)
GFR calc Af Amer: >60 mL/min (ref >60)
GFR calc non Af Amer: >60 mL/min (ref >60)
Glucose, Bld: 113 mg/dL — ABNORMAL HIGH (ref 65–99)
Potassium: 4.6 mmol/L (ref 3.5–5.1)
Sodium: 138 mmol/L (ref 135–145)

## 2017-01-31 LAB — URINALYSIS, ROUTINE W REFLEX MICROSCOPIC
BILIRUBIN URINE: NEGATIVE
Glucose, UA: NEGATIVE mg/dL
KETONES UR: NEGATIVE mg/dL
Nitrite: POSITIVE — AB
PH: 5.5 (ref 5.0–8.0)
PROTEIN: 30 mg/dL — AB
Specific Gravity, Urine: 1.02 (ref 1.005–1.030)

## 2017-01-31 LAB — URINALYSIS, MICROSCOPIC (REFLEX)

## 2017-02-01 ENCOUNTER — Other Ambulatory Visit (HOSPITAL_COMMUNITY): Payer: Self-pay

## 2017-02-01 LAB — BLOOD CULTURE ID PANEL (REFLEXED)
ACINETOBACTER BAUMANNII: NOT DETECTED
CANDIDA ALBICANS: NOT DETECTED
CANDIDA PARAPSILOSIS: NOT DETECTED
Candida glabrata: NOT DETECTED
Candida krusei: NOT DETECTED
Candida tropicalis: NOT DETECTED
Carbapenem resistance: NOT DETECTED
ENTEROBACTER CLOACAE COMPLEX: NOT DETECTED
ENTEROBACTERIACEAE SPECIES: DETECTED — AB
ENTEROCOCCUS SPECIES: NOT DETECTED
Escherichia coli: DETECTED — AB
HAEMOPHILUS INFLUENZAE: NOT DETECTED
KLEBSIELLA PNEUMONIAE: NOT DETECTED
Klebsiella oxytoca: NOT DETECTED
LISTERIA MONOCYTOGENES: NOT DETECTED
NEISSERIA MENINGITIDIS: NOT DETECTED
PSEUDOMONAS AERUGINOSA: NOT DETECTED
Proteus species: NOT DETECTED
STAPHYLOCOCCUS AUREUS BCID: NOT DETECTED
STREPTOCOCCUS AGALACTIAE: NOT DETECTED
STREPTOCOCCUS PYOGENES: NOT DETECTED
Serratia marcescens: NOT DETECTED
Staphylococcus species: NOT DETECTED
Streptococcus pneumoniae: NOT DETECTED
Streptococcus species: NOT DETECTED

## 2017-02-01 LAB — URINE CULTURE: Culture: <10000 — AB

## 2017-02-02 LAB — BASIC METABOLIC PANEL
Anion gap: 10 (ref 5–15)
BUN: 35 mg/dL — AB (ref 6–20)
CALCIUM: 9 mg/dL (ref 8.9–10.3)
CO2: 19 mmol/L — ABNORMAL LOW (ref 22–32)
CREATININE: 0.86 mg/dL (ref 0.44–1.00)
Chloride: 111 mmol/L (ref 101–111)
GFR calc non Af Amer: >60 mL/min (ref >60)
Glucose, Bld: 143 mg/dL — ABNORMAL HIGH (ref 65–99)
Potassium: 5.3 mmol/L — ABNORMAL HIGH (ref 3.5–5.1)
SODIUM: 140 mmol/L (ref 135–145)

## 2017-02-02 LAB — CBC
HCT: 34.9 % — ABNORMAL LOW (ref 36.0–46.0)
HEMOGLOBIN: 11 g/dL — AB (ref 12.0–15.0)
MCH: 29.8 pg (ref 26.0–34.0)
MCHC: 31.5 g/dL (ref 30.0–36.0)
MCV: 94.6 fL (ref 78.0–100.0)
PLATELETS: 167 10*3/uL (ref 150–400)
RBC: 3.69 MIL/uL — ABNORMAL LOW (ref 3.87–5.11)
RDW: 19 % — AB (ref 11.5–15.5)
WBC: 29 10*3/uL — ABNORMAL HIGH (ref 4.0–10.5)

## 2017-02-03 LAB — HEPATIC FUNCTION PANEL
ALK PHOS: 201 U/L — AB (ref 38–126)
ALT: 54 U/L (ref 14–54)
AST: 38 U/L (ref 15–41)
Albumin: 2.3 g/dL — ABNORMAL LOW (ref 3.5–5.0)
BILIRUBIN DIRECT: 0.3 mg/dL (ref 0.1–0.5)
BILIRUBIN INDIRECT: 0.6 mg/dL (ref 0.3–0.9)
BILIRUBIN TOTAL: 0.9 mg/dL (ref 0.3–1.2)
Total Protein: 7.5 g/dL (ref 6.5–8.1)

## 2017-02-03 LAB — BASIC METABOLIC PANEL
Anion gap: 11 (ref 5–15)
BUN: 26 mg/dL — AB (ref 6–20)
CALCIUM: 8.8 mg/dL — AB (ref 8.9–10.3)
CO2: 22 mmol/L (ref 22–32)
CREATININE: 0.6 mg/dL (ref 0.44–1.00)
Chloride: 109 mmol/L (ref 101–111)
GFR calc Af Amer: >60 mL/min (ref >60)
GLUCOSE: 95 mg/dL (ref 65–99)
Potassium: 4.4 mmol/L (ref 3.5–5.1)
Sodium: 142 mmol/L (ref 135–145)

## 2017-02-03 LAB — CULTURE, BLOOD (ROUTINE X 2)
SPECIAL REQUESTS: ADEQUATE
Special Requests: ADEQUATE

## 2017-02-03 LAB — MAGNESIUM: Magnesium: 2 mg/dL (ref 1.7–2.4)

## 2017-02-03 LAB — PHOSPHORUS: PHOSPHORUS: 3.1 mg/dL (ref 2.5–4.6)

## 2017-02-04 LAB — CBC
HEMATOCRIT: 33.9 % — AB (ref 36.0–46.0)
HEMOGLOBIN: 10.4 g/dL — AB (ref 12.0–15.0)
MCH: 29.4 pg (ref 26.0–34.0)
MCHC: 30.7 g/dL (ref 30.0–36.0)
MCV: 95.8 fL (ref 78.0–100.0)
Platelets: 146 10*3/uL — ABNORMAL LOW (ref 150–400)
RBC: 3.54 MIL/uL — ABNORMAL LOW (ref 3.87–5.11)
RDW: 18.5 % — AB (ref 11.5–15.5)
WBC: 16.9 10*3/uL — AB (ref 4.0–10.5)

## 2017-02-04 LAB — BASIC METABOLIC PANEL
ANION GAP: 9 (ref 5–15)
BUN: 21 mg/dL — ABNORMAL HIGH (ref 6–20)
CALCIUM: 8.8 mg/dL — AB (ref 8.9–10.3)
CHLORIDE: 108 mmol/L (ref 101–111)
CO2: 24 mmol/L (ref 22–32)
Creatinine, Ser: 0.52 mg/dL (ref 0.44–1.00)
GFR calc non Af Amer: >60 mL/min (ref >60)
Glucose, Bld: 138 mg/dL — ABNORMAL HIGH (ref 65–99)
POTASSIUM: 4.5 mmol/L (ref 3.5–5.1)
Sodium: 141 mmol/L (ref 135–145)

## 2017-02-06 LAB — CBC
HCT: 32.5 % — ABNORMAL LOW (ref 36.0–46.0)
HEMOGLOBIN: 9.8 g/dL — AB (ref 12.0–15.0)
MCH: 28.7 pg (ref 26.0–34.0)
MCHC: 30.2 g/dL (ref 30.0–36.0)
MCV: 95.3 fL (ref 78.0–100.0)
Platelets: 218 10*3/uL (ref 150–400)
RBC: 3.41 MIL/uL — ABNORMAL LOW (ref 3.87–5.11)
RDW: 18.5 % — ABNORMAL HIGH (ref 11.5–15.5)
WBC: 24 10*3/uL — ABNORMAL HIGH (ref 4.0–10.5)

## 2017-02-06 LAB — PROCALCITONIN: Procalcitonin: 0.1 ng/mL

## 2017-02-08 LAB — CBC
HCT: 33.8 % — ABNORMAL LOW (ref 36.0–46.0)
HEMOGLOBIN: 10.3 g/dL — AB (ref 12.0–15.0)
MCH: 29.2 pg (ref 26.0–34.0)
MCHC: 30.5 g/dL (ref 30.0–36.0)
MCV: 95.8 fL (ref 78.0–100.0)
PLATELETS: 224 10*3/uL (ref 150–400)
RBC: 3.53 MIL/uL — AB (ref 3.87–5.11)
RDW: 19 % — ABNORMAL HIGH (ref 11.5–15.5)
WBC: 19.8 10*3/uL — AB (ref 4.0–10.5)

## 2017-02-08 LAB — CULTURE, BLOOD (ROUTINE X 2)
Culture: NO GROWTH
Culture: NO GROWTH
SPECIAL REQUESTS: ADEQUATE
Special Requests: ADEQUATE

## 2017-02-11 LAB — CBC
HCT: 33 % — ABNORMAL LOW (ref 36.0–46.0)
Hemoglobin: 10.1 g/dL — ABNORMAL LOW (ref 12.0–15.0)
MCH: 29.6 pg (ref 26.0–34.0)
MCHC: 30.6 g/dL (ref 30.0–36.0)
MCV: 96.8 fL (ref 78.0–100.0)
Platelets: 280 10*3/uL (ref 150–400)
RBC: 3.41 MIL/uL — ABNORMAL LOW (ref 3.87–5.11)
RDW: 19.2 % — ABNORMAL HIGH (ref 11.5–15.5)
WBC: 19.7 10*3/uL — ABNORMAL HIGH (ref 4.0–10.5)

## 2017-02-11 LAB — BLOOD CULTURE ID PANEL (REFLEXED)
Acinetobacter baumannii: NOT DETECTED
CANDIDA GLABRATA: NOT DETECTED
CANDIDA KRUSEI: NOT DETECTED
CANDIDA PARAPSILOSIS: NOT DETECTED
CANDIDA TROPICALIS: NOT DETECTED
Candida albicans: NOT DETECTED
ENTEROCOCCUS SPECIES: NOT DETECTED
ESCHERICHIA COLI: NOT DETECTED
Enterobacter cloacae complex: NOT DETECTED
Enterobacteriaceae species: NOT DETECTED
Haemophilus influenzae: NOT DETECTED
KLEBSIELLA OXYTOCA: NOT DETECTED
KLEBSIELLA PNEUMONIAE: NOT DETECTED
LISTERIA MONOCYTOGENES: NOT DETECTED
Methicillin resistance: DETECTED — AB
Neisseria meningitidis: NOT DETECTED
PROTEUS SPECIES: NOT DETECTED
Pseudomonas aeruginosa: NOT DETECTED
SERRATIA MARCESCENS: NOT DETECTED
STAPHYLOCOCCUS AUREUS BCID: NOT DETECTED
STAPHYLOCOCCUS SPECIES: DETECTED — AB
Streptococcus agalactiae: NOT DETECTED
Streptococcus pneumoniae: NOT DETECTED
Streptococcus pyogenes: NOT DETECTED
Streptococcus species: NOT DETECTED

## 2017-02-11 LAB — BASIC METABOLIC PANEL
Anion gap: 11 (ref 5–15)
BUN: 15 mg/dL (ref 6–20)
CHLORIDE: 102 mmol/L (ref 101–111)
CO2: 22 mmol/L (ref 22–32)
CREATININE: 0.56 mg/dL (ref 0.44–1.00)
Calcium: 9 mg/dL (ref 8.9–10.3)
GFR calc Af Amer: >60 mL/min (ref >60)
GFR calc non Af Amer: >60 mL/min (ref >60)
GLUCOSE: 222 mg/dL — AB (ref 65–99)
Potassium: 4.4 mmol/L (ref 3.5–5.1)
SODIUM: 135 mmol/L (ref 135–145)

## 2017-02-13 LAB — CULTURE, BLOOD (ROUTINE X 2): Special Requests: ADEQUATE

## 2017-02-14 LAB — CBC
HCT: 30.9 % — ABNORMAL LOW (ref 36.0–46.0)
Hemoglobin: 9.5 g/dL — ABNORMAL LOW (ref 12.0–15.0)
MCH: 29.2 pg (ref 26.0–34.0)
MCHC: 30.7 g/dL (ref 30.0–36.0)
MCV: 95.1 fL (ref 78.0–100.0)
PLATELETS: 299 10*3/uL (ref 150–400)
RBC: 3.25 MIL/uL — AB (ref 3.87–5.11)
RDW: 18.2 % — AB (ref 11.5–15.5)
WBC: 19.7 10*3/uL — AB (ref 4.0–10.5)

## 2017-02-14 LAB — BASIC METABOLIC PANEL
Anion gap: 11 (ref 5–15)
BUN: 16 mg/dL (ref 6–20)
CALCIUM: 8.7 mg/dL — AB (ref 8.9–10.3)
CO2: 24 mmol/L (ref 22–32)
Chloride: 102 mmol/L (ref 101–111)
Creatinine, Ser: 0.68 mg/dL (ref 0.44–1.00)
GFR calc Af Amer: >60 mL/min (ref >60)
Glucose, Bld: 111 mg/dL — ABNORMAL HIGH (ref 65–99)
POTASSIUM: 4.3 mmol/L (ref 3.5–5.1)
SODIUM: 137 mmol/L (ref 135–145)

## 2017-02-15 LAB — CULTURE, BLOOD (ROUTINE X 2)
Culture: NO GROWTH
Special Requests: ADEQUATE

## 2017-02-17 LAB — CBC
HEMATOCRIT: 33.9 % — AB (ref 36.0–46.0)
HEMOGLOBIN: 10.5 g/dL — AB (ref 12.0–15.0)
MCH: 29.2 pg (ref 26.0–34.0)
MCHC: 31 g/dL (ref 30.0–36.0)
MCV: 94.4 fL (ref 78.0–100.0)
Platelets: 316 10*3/uL (ref 150–400)
RBC: 3.59 MIL/uL — ABNORMAL LOW (ref 3.87–5.11)
RDW: 17.8 % — AB (ref 11.5–15.5)
WBC: 22.3 10*3/uL — AB (ref 4.0–10.5)

## 2017-02-17 LAB — BASIC METABOLIC PANEL
ANION GAP: 10 (ref 5–15)
BUN: 18 mg/dL (ref 6–20)
CHLORIDE: 103 mmol/L (ref 101–111)
CO2: 23 mmol/L (ref 22–32)
Calcium: 9.3 mg/dL (ref 8.9–10.3)
Creatinine, Ser: 0.62 mg/dL (ref 0.44–1.00)
GFR calc Af Amer: >60 mL/min (ref >60)
GFR calc non Af Amer: >60 mL/min (ref >60)
Glucose, Bld: 204 mg/dL — ABNORMAL HIGH (ref 65–99)
Potassium: 4.3 mmol/L (ref 3.5–5.1)
Sodium: 136 mmol/L (ref 135–145)

## 2017-02-18 LAB — CULTURE, BLOOD (SINGLE)
Culture: NO GROWTH
Special Requests: ADEQUATE

## 2017-02-20 LAB — CBC
HCT: 34.6 % — ABNORMAL LOW (ref 36.0–46.0)
HEMOGLOBIN: 10.6 g/dL — AB (ref 12.0–15.0)
MCH: 29.3 pg (ref 26.0–34.0)
MCHC: 30.6 g/dL (ref 30.0–36.0)
MCV: 95.6 fL (ref 78.0–100.0)
PLATELETS: 305 10*3/uL (ref 150–400)
RBC: 3.62 MIL/uL — ABNORMAL LOW (ref 3.87–5.11)
RDW: 18.1 % — ABNORMAL HIGH (ref 11.5–15.5)
WBC: 18 10*3/uL — AB (ref 4.0–10.5)

## 2017-02-21 DIAGNOSIS — L89309 Pressure ulcer of unspecified buttock, unspecified stage: Secondary | ICD-10-CM

## 2017-02-21 HISTORY — DX: Pressure ulcer of unspecified buttock, unspecified stage: L89.309

## 2017-02-27 LAB — BASIC METABOLIC PANEL
Anion gap: 10 (ref 5–15)
BUN: 29 mg/dL — ABNORMAL HIGH (ref 6–20)
CALCIUM: 9.4 mg/dL (ref 8.9–10.3)
CO2: 20 mmol/L — AB (ref 22–32)
CREATININE: 0.68 mg/dL (ref 0.44–1.00)
Chloride: 106 mmol/L (ref 101–111)
GFR calc Af Amer: >60 mL/min (ref >60)
GLUCOSE: 191 mg/dL — AB (ref 65–99)
Potassium: 4.7 mmol/L (ref 3.5–5.1)
Sodium: 136 mmol/L (ref 135–145)

## 2017-02-27 LAB — CBC
HEMATOCRIT: 34.7 % — AB (ref 36.0–46.0)
Hemoglobin: 10.9 g/dL — ABNORMAL LOW (ref 12.0–15.0)
MCH: 29.3 pg (ref 26.0–34.0)
MCHC: 31.4 g/dL (ref 30.0–36.0)
MCV: 93.3 fL (ref 78.0–100.0)
PLATELETS: 304 10*3/uL (ref 150–400)
RBC: 3.72 MIL/uL — ABNORMAL LOW (ref 3.87–5.11)
RDW: 16.9 % — AB (ref 11.5–15.5)
WBC: 18.1 10*3/uL — AB (ref 4.0–10.5)

## 2017-03-01 ENCOUNTER — Non-Acute Institutional Stay (SKILLED_NURSING_FACILITY): Payer: BLUE CROSS/BLUE SHIELD | Admitting: Internal Medicine

## 2017-03-01 ENCOUNTER — Encounter: Payer: Self-pay | Admitting: Internal Medicine

## 2017-03-01 DIAGNOSIS — R1310 Dysphagia, unspecified: Secondary | ICD-10-CM | POA: Diagnosis not present

## 2017-03-01 DIAGNOSIS — R441 Visual hallucinations: Secondary | ICD-10-CM | POA: Diagnosis not present

## 2017-03-01 DIAGNOSIS — D638 Anemia in other chronic diseases classified elsewhere: Secondary | ICD-10-CM

## 2017-03-01 DIAGNOSIS — F418 Other specified anxiety disorders: Secondary | ICD-10-CM

## 2017-03-01 DIAGNOSIS — J849 Interstitial pulmonary disease, unspecified: Secondary | ICD-10-CM | POA: Diagnosis not present

## 2017-03-01 DIAGNOSIS — R410 Disorientation, unspecified: Secondary | ICD-10-CM

## 2017-03-01 DIAGNOSIS — E43 Unspecified severe protein-calorie malnutrition: Secondary | ICD-10-CM

## 2017-03-01 DIAGNOSIS — R300 Dysuria: Secondary | ICD-10-CM

## 2017-03-01 DIAGNOSIS — M3320 Polymyositis, organ involvement unspecified: Secondary | ICD-10-CM

## 2017-03-01 DIAGNOSIS — I10 Essential (primary) hypertension: Secondary | ICD-10-CM

## 2017-03-01 DIAGNOSIS — K219 Gastro-esophageal reflux disease without esophagitis: Secondary | ICD-10-CM | POA: Diagnosis not present

## 2017-03-01 DIAGNOSIS — D72829 Elevated white blood cell count, unspecified: Secondary | ICD-10-CM

## 2017-03-01 DIAGNOSIS — K5909 Other constipation: Secondary | ICD-10-CM

## 2017-03-01 DIAGNOSIS — R531 Weakness: Secondary | ICD-10-CM

## 2017-03-01 NOTE — Progress Notes (Signed)
LOCATION: [facility]  PCP: System, Pcp Not In   Code Status: Full Code  Goals of care: Advanced Directive information Advanced Directives 11/30/2015  Does Patient Have a Medical Advance Directive? No  Would patient like information on creating a medical advance directive? -  Pre-existing out of facility DNR order (yellow form or pink MOST form) -       Extended Emergency Contact Information Primary Emergency Contact: Balik,Sammy Address: [address]          [place], [place] [address] [place] Phone: [phone] Relation: Spouse Secondary Emergency Contact: Emi [place], [place] Phone: [phone] Relation: Daughter   Allergies  Allergen Reactions  . Clindamycin/Lincomycin Itching and Rash    Possible reaction (took clindamycin and cipro at the same time)  . Doxycycline Nausea Only  . Percocet [Oxycodone-Acetaminophen] Itching  . Ciprofloxacin Itching and Rash    Possible reaction (took clindamycin and cipro at the same time)   . Mycophenolate Diarrhea    Chief Complaint  Patient presents with  . New Admit To SNF    New Admission Visit      HPI:  Patient is a 57 y.o. female seen today for short term rehabilitation post hospital admission from 12/10/16-01/18/17 and LTAC stay from 01/18/17-02/28/17 with altered mental state and acute hypoxic respiratory failure. She had H. Influenza pneumonia and sepsis with hypotension and acute renal failure. She was placed on iv fluids, iv antibiotic and required dialysis. Her AMS was thought to be from infection, prednisone. Cognitive defecit and narcotics. She had candida fungemia and was placed on antifungal. She received wound care for pressure ulcers. She received feeding via NG tube and was then transferred to [facility]. In LTAC, her diet was slowly advanced with improvement of po intake. She has completed her antibiotic for e.coli bacteremia. Her prednisone dose  was decreased and tacrolimus was resumed at [facility]. She was followed by PT, OT and SLP. She has PMH of ILD, polymyositis on tacrolimus and prednisone. She is seen in her room today. She denies any concern. Per nursing, patient was having visual hallucination this morning and saw snakes in her room.   Review of Systems:  Constitutional: Negative for fever, chills. Feels weak and tired.  HENT: Negative for headache, congestion, nasal discharge, sore throat, difficulty swallowing.   Eyes: Negative for blurred vision, double vision and discharge. Wears glasses.  Respiratory: Negative for shortness of breath and wheezing. Positive for dry cough.   Cardiovascular: Negative for chest pain, palpitations, leg swelling.  Gastrointestinal: Negative for heartburn, nausea, vomiting, abdominal pain, loss of appetite. She does not remember her last bowel movement.  Genitourinary: Negative for flank pain. Positive for dysuria for few days.  Musculoskeletal: Negative for back pain, fall in the facility. positive for pain.  Skin: Negative for itching, rash.  Neurological: Negative for dizziness. Psychiatric/Behavioral: Negative for depression.   Past Medical History:  Diagnosis Date  . Anemia   . Anxiety   . Constipation   . Depression   . Fibromyalgia   . GERD (gastroesophageal reflux disease)   . Headache(784.0)   . Hypertension   . Osteoarthritis   . Osteopenia   . Peripheral vascular disease (HCC)   . Pneumonia 03/2012  . Polymyositis (HCC)   . Polymyositis (HCC)   . Polymyositis (HCC)   . Raynaud phenomenon   . Rheumatoid arthritis (HCC)   . Seizures (HCC)  with chid birth 5f 2 childre  . Shortness of breath    with exertion  . Type II diabetes mellitus (HCC)    on no medications   Past Surgical History:  Procedure Laterality Date  . ABDOMINAL AORTAGRAM N/A 07/25/2014   Procedure: ABDOMINAL Ronny Flurry;  Surgeon: Sherren Kerns, MD;  Location: [facility] CATH LAB;  Service: Cardiovascular;   Laterality: N/A;  . AMPUTATION Left 12/24/2012   Procedure: AMPUTATION DIGIT left great toe;  Surgeon: Sherren Kerns, MD;  Location: [facility] OR;  Service: Vascular;  Laterality: Left;  GREAT  . AMPUTATION Right 05/19/2015   Procedure: AMPUTATION DIGIT;  Surgeon: Sherren Kerns, MD;  Location: [facility] OR;  Service: Vascular;  Laterality: Right;  . AMPUTATION Right 07/22/2015   Procedure: RAY AMPUTATION OF RIGHT GREAT TOE AMPUTATION SITE;  Surgeon: Sherren Kerns, MD;  Location: [facility] OR;  Service: Vascular;  Laterality: Right;  . APPLICATION OF A-CELL OF EXTREMITY Right 11/12/2015   Procedure: INTEGRA;  Surgeon: Alena Bills Dillingham, DO;  Location: [facility] OR;  Service: Plastics;  Laterality: Right;  . CESAREAN SECTION     x3  . COLONOSCOPY    . HYSTEROSCOPY     w/ D&C with excision of endometrial polyps  . I&D EXTREMITY Right 10/26/2015   Procedure: IRRIGATION AND DEBRIDEMENT EXTREMITY/RIGHT FOOT;  Surgeon: Fransisco Hertz, MD;  Location: [facility] OR;  Service: Vascular;  Laterality: Right;  . I&D EXTREMITY Right 10/29/2015   Procedure: IRRIGATION AND DEBRIDEMENT RIGHT FOOT WITH ACELL AND VAC PLACEMENT;  Surgeon: Alena Bills Dillingham, DO;  Location: [facility] OR;  Service: Plastics;  Laterality: Right;  . I&D EXTREMITY Right 11/12/2015   Procedure: IRRIGATION AND DEBRIDEMENT RIGHT EXTREMITY;  Surgeon: Alena Bills Dillingham, DO;  Location: [facility] OR;  Service: Plastics;  Laterality: Right;  . IRRIGATION AND DEBRIDEMENT FOOT Right 10/26/2015  . left great toenail removal  Left 2013   done by [place] [facility]  . MINOR APPLICATION OF WOUND VAC Right 11/12/2015   Procedure: MINOR APPLICATION OF WOUND VAC;  Surgeon: Alena Bills Dillingham, DO;  Location: [facility] OR;  Service: Plastics;  Laterality: Right;  . PERIPHERAL VASCULAR CATHETERIZATION N/A 05/15/2015   Procedure: Abdominal Aortogram;  Surgeon: Sherren Kerns, MD;  Location: [facility] INVASIVE CV LAB;  Service: Cardiovascular;  Laterality: N/A;  . SKIN SPLIT GRAFT Right 12/02/2015   Procedure: SKIN  GRAFT SPLIT THICKNESS RIGHT FOOT WITH PLACEMENT VAC DRESSING;  Surgeon: Alena Bills Dillingham, DO;  Location: [facility] OR;  Service: Plastics;  Laterality: Right;  . TONSILLECTOMY Bilateral   . TUBAL LIGATION     Social History:   reports that she has never smoked. She has never used smokeless tobacco. She reports that she drinks alcohol. She reports that she does not use drugs.  Family History  Problem Relation Age of Onset  . Diabetes Mother   . Hypertension Mother   . Diabetes Father   . Heart disease Father   . Hypertension Sister   . Heart disease Sister     Medications: Allergies as of 03/01/2017      Reactions   Clindamycin/lincomycin Itching, Rash   Possible reaction (took clindamycin and cipro at the same time)   Doxycycline Nausea Only   Percocet [oxycodone-acetaminophen] Itching   Ciprofloxacin Itching, Rash   Possible reaction (took clindamycin and cipro at the same time)   Mycophenolate Diarrhea      Medication List       Accurate as of 03/01/17  3:24 PM. Always use your  most recent med list.          amLODipine 5 MG tablet Commonly known as:  NORVASC Take 5 mg by mouth daily.   Cholecalciferol 1000 units tablet Take 1,000 Units by mouth daily.   clonazePAM 0.5 MG tablet Commonly known as:  KLONOPIN Take 0.5 mg by mouth every 8 (eight) hours as needed for anxiety. Stop date 03/14/17   DULoxetine 20 MG capsule Commonly known as:  CYMBALTA Take 40 mg by mouth daily.   eucerin cream Apply 1 application topically daily.   famotidine 20 MG tablet Commonly known as:  PEPCID Take 20 mg by mouth 2 (two) times daily.   feeding supplement (PRO-STAT SUGAR FREE 64) Liqd Take 30 mLs by mouth 2 (two) times daily.   Fish Oil 1000 MG Caps Take 1 capsule by mouth daily.   heparin 5,000 Units in sodium chloride 0.9 % 1,000 mL Inject 1 mL as directed every 8 (eight) hours.   Melatonin 3 MG Tabs Take 3 mg by mouth at bedtime.   metoprolol tartrate 25 MG  tablet Commonly known as:  LOPRESSOR Take 25 mg by mouth every 8 (eight) hours.   multivitamin tablet Take 1 tablet by mouth daily.   oxycodone 5 MG capsule Commonly known as:  OXY-IR Take 2.5 mg by mouth 3 (three) times daily.   predniSONE 5 MG tablet Commonly known as:  DELTASONE Take 5 mg by mouth daily with breakfast.   REGULOID PO Take by mouth. Give 1 tablespoon by mouth 2 times daily   risperiDONE 0.25 MG tablet Commonly known as:  RISPERDAL Take 0.25 mg by mouth at bedtime.   sennosides-docusate sodium 8.6-50 MG tablet Commonly known as:  SENOKOT-S Take 1 tablet by mouth at bedtime.   tacrolimus 1 MG capsule Commonly known as:  PROGRAF Take 2 mg by mouth 2 (two) times daily.   UNABLE TO FIND Med Name: Med pass 120 mL by mouth 2 times daily       Immunizations: Immunization History  Administered Date(s) Administered  . Influenza-Unspecified 07/08/2015  . Pneumococcal-Unspecified 07/24/2013     Physical Exam: Vitals:   03/01/17 1459  BP: 127/86  Pulse: 86  Resp: 20  Temp: 99.8 F (37.7 C)  TempSrc: Oral  SpO2: 96%  Weight: 127 lb 4.8 oz (57.7 kg)  Height: 5\' 1"  (1.549 m)   Body mass index is 24.05 kg/m.  General- elderly female, thin built, frail, in no acute distress Head- normocephalic, atraumatic Nose- no maxillary or frontal sinus tenderness, no nasal discharge Throat- moist mucus membrane, normal oropharynx, has dentures Eyes- PERRLA, EOMI, no pallor, no icterus, no discharge, normal conjunctiva, normal sclera Neck- no cervical lymphadenopathy Cardiovascular- normal s1,s2, no murmur Respiratory- bilateral clear to auscultation, no wheeze, no rhonchi, no crackles, no use of accessory muscles Abdomen- bowel sounds present, soft, non tender, no guarding or rigidity Musculoskeletal- able to move all 4 extremities, generalized weakness more to her legs, 1 person max assist, severe arthritis changes to her digits Neurological- alert and  oriented to self and place only Skin- warm and dry, calcinosis to both arms, multiple pressure ulcers, heel floaters Psychiatry- normal affect at present, denies any hallucination at present    Labs reviewed: Basic Metabolic Panel:  Recent Labs  1438  02/03/17 0620  02/14/17 0445 02/17/17 1130 02/27/17 2138  NA 135  < > 142  < > 137 136 136  K 4.6  < > 4.4  < > 4.3 4.3 4.7  CL 106  < > 109  < > 102 103 106  CO2 19*  < > 22  < > 24 23 20*  GLUCOSE 96  < > 95  < > 111* 204* 191*  BUN 8  < > 26*  < > 16 18 29*  CREATININE 0.44  < > 0.60  < > 0.68 0.62 0.68  CALCIUM 8.9  < > 8.8*  < > 8.7* 9.3 9.4  MG 1.8  --  2.0  --   --   --   --   PHOS  --   --  3.1  --   --   --   --   < > = values in this interval not displayed. Liver Function Tests:  Recent Labs  01/19/17 1119 01/19/17 1810 02/03/17 0620  AST 50* 43* 38  ALT 55* 55* 54  ALKPHOS 205* 215* 201*  BILITOT 0.7 0.9 0.9  PROT 7.5 7.4 7.5  ALBUMIN 2.2* 2.0* 2.3*   No results for input(s): LIPASE, AMYLASE in the last 8760 hours. No results for input(s): AMMONIA in the last 8760 hours. CBC:  Recent Labs  02/17/17 1130 02/20/17 0500 02/27/17 2138  WBC 22.3* 18.0* 18.1*  HGB 10.5* 10.6* 10.9*  HCT 33.9* 34.6* 34.7*  MCV 94.4 95.6 93.3  PLT 316 305 304   Cardiac Enzymes: No results for input(s): CKTOTAL, CKMB, CKMBINDEX, TROPONINI in the last 8760 hours. BNP: Invalid input(s): POCBNP CBG: No results for input(s): GLUCAP in the last 8760 hours.  Radiological Exams: Dg Chest Port 1 View  Result Date: 02/01/2017 CLINICAL DATA:  Initial evaluation for left PICC line placement. EXAM: PORTABLE CHEST 1 VIEW COMPARISON:  Prior radiograph from 01/31/2017. FINDINGS: There has been interval placement of a left-sided PICC catheter with tip seen overlying the cavoatrial junction. Mild cardiomegaly, stable. Mediastinal silhouette within normal limits. Lungs normally inflated. Patchy bibasilar opacities are similar  from previous. No new focal airspace disease. There is slightly increased pulmonary interstitial prominence and vascular congestion, which may reflect developing pulmonary interstitial edema. No definite pleural effusion. No pneumothorax. No acute osseus abnormality. IMPRESSION: 1. Tip of left PICC catheter overlying the cavoatrial junction. 2. Slightly increased vascular and interstitial prominence, which may reflect developing pulmonary interstitial edema. 3. Similar patchy bibasilar opacities. Electronically Signed   By: Rise Mu M.D.   On: 02/01/2017 20:36   Dg Chest Port 1 View  Result Date: 01/31/2017 CLINICAL DATA:  Shortness of breath. EXAM: PORTABLE CHEST 1 VIEW COMPARISON:  Radiograph of January 18, 2017. FINDINGS: The heart size and mediastinal contours are within normal limits. No pneumothorax or pleural effusion is noted. Stable mild bibasilar opacities are noted concerning for atelectasis or infiltrates. The visualized skeletal structures are unremarkable. IMPRESSION: Stable bibasilar opacities as described above. Electronically Signed   By: Lupita Raider, M.D.   On: 01/31/2017 07:48    Assessment/Plan  Generalized weakness From deconditioning. Will have patient work with PT/OT as tolerated to regain strength and restore function.  Fall precautions are in place. Get palliative care consult to review goals of care.   Visual hallucination Denies any at present. Rule out infection and electrolyte abnormality. Reviewed her psychiatry medications. Get psychiatry consult.   Leukocytosis Completed antibiotic for HCAP and e.coli bacteremia. Currently on chronic prednisone. These could contribute to elevated white count along with possible UTI.  Dysuria Check u/a with c/s, encourage hydration and to maintain perineal hygiene  Dysphagia  Assist with feeding, aspiration precautions, continue mechanical  soft diet and thin liquids  Protein calorie malnutrition Severe, needs RD  consult, continue supplements and MVI for now  Anemia of chronic disease With polymyositis, being on chronic prednisone and tacrolimus, monitor cbc  Polymyositis To f/u with rheumatology. Continue her tacrolimus and prednisone. Continue oxyIR 2.5 mg TID. PMR consult.   ILD Needs follow up with pulmonology.   Delirium Possibly relate to acute illness and dementia. Currently on risperdal 0.25 mg qhs, psych consult. Provide supportive care.   HTN Continue metoprolol tartrate 25 mg tid with amlodipine, check bp  gerd Continue pepcid  Chronic depression and anxiety Continue cymbalta and monitor, psych consult. Continue klonopin 0.5 mg q8h prn and monitor.   Chronic constipation Continue senna s and reguloid fiber supplement  Goals of care: short term rehabilitation   Labs/tests ordered: cbc, cmp, tsh 03/02/17   Family/ staff Communication: reviewed care plan with patient and nursing supervisor    Oneal Grout, MD Internal Medicine [facility] [address] [place], [place] [address] Cell Phone (Monday-Friday 8 am - 5 pm): [phone] On Call: [phone] and follow prompts after 5 pm and on weekends Office Phone: [phone] Office Fax: [phone]

## 2017-03-13 LAB — BASIC METABOLIC PANEL
BUN: 8 mg/dL (ref 4–21)
CREATININE: 0.5 mg/dL (ref 0.5–1.1)
GLUCOSE: 105 mg/dL
Potassium: 4.1 mmol/L (ref 3.4–5.3)
Sodium: 143 mmol/L (ref 137–147)

## 2017-03-13 LAB — CBC AND DIFFERENTIAL
HEMATOCRIT: 33 % — AB (ref 36–46)
Hemoglobin: 10 g/dL — AB (ref 12.0–16.0)
PLATELETS: 353 10*3/uL (ref 150–399)
WBC: 13.1 10^3/mL

## 2017-03-24 ENCOUNTER — Other Ambulatory Visit: Payer: Self-pay | Admitting: Physician Assistant

## 2017-03-24 DIAGNOSIS — L89890 Pressure ulcer of other site, unstageable: Secondary | ICD-10-CM

## 2017-04-07 ENCOUNTER — Encounter: Payer: Self-pay | Admitting: Family

## 2017-04-10 ENCOUNTER — Encounter: Payer: Self-pay | Admitting: Family

## 2017-04-10 ENCOUNTER — Ambulatory Visit (INDEPENDENT_AMBULATORY_CARE_PROVIDER_SITE_OTHER): Payer: Self-pay | Admitting: Family

## 2017-04-10 VITALS — BP 120/86 | HR 76 | Temp 97.8°F | Resp 16 | Ht 61.0 in

## 2017-04-10 DIAGNOSIS — M3320 Polymyositis, organ involvement unspecified: Secondary | ICD-10-CM

## 2017-04-10 DIAGNOSIS — I739 Peripheral vascular disease, unspecified: Secondary | ICD-10-CM

## 2017-04-10 DIAGNOSIS — L97529 Non-pressure chronic ulcer of other part of left foot with unspecified severity: Secondary | ICD-10-CM

## 2017-04-10 DIAGNOSIS — M069 Rheumatoid arthritis, unspecified: Secondary | ICD-10-CM

## 2017-04-10 NOTE — Patient Instructions (Signed)

## 2017-04-10 NOTE — Progress Notes (Signed)
[facility] [place]   CC: Evaluation of non healing wound lateral left foot  History of Present Illness Allison Beasley is a 56 y.o. female patient of Dr. Darrick Penna who is s/p resection of her right first metatarsal head and drainage of an abscess in her foot on July 22, 2015. She is currently doing local wound care twice daily. She is still having some pain in the right foot, is no worse.  She took 10 days of Levaquin, 500 mg daily, and finished on 10/12. She is currently taking Percocet for the pain. Pt has DM, states she has not checked her blood sugar in a month.  She does not smoke and is not exposed to secondhand smoke.  On 07/20/15 BMP glucose was 95, review of records. Venous duplex of right leg on 06-25-15 was negative for DVT.    She returns today at the urgent request of staff at [facility], for non healing wound 5th metatarsal of left foot and severe pain in left foot.  She is shivering and states she feels cold in the exam room now, was covered with blankets, but otherwise she denies fever or chills.  She states that the wound on the base of her left 5th metatarsal area started with what seemed to her as a snake bit e about 3 weeks ago, that happened in her sleep, that there were known snakes in the house she was staying in at that time that was connected to the nursing facility.  She states that the redness surrounding the left foot wound seems less red; states she was taking antibiotics by mouth for a few days recently.  Right foot wounds are completely healed, she states right 3rd toe is slightly painful.  Review of records under imaging shows that she is scheduled for an MRI of her left foot on 04-18-17.   Dr. Darrick Penna last evaluated pt on 10-01-15. At that time the right toe amputation site was completely healed. She had a pustule on the dorsum of her right foot adjacent to the metatarsal head of the third toe that did not appear to communicate into the deep  tissues. Dr. Darrick Penna prescribed for her Keflex 500 mg 3 times a day for 2 week course and advised her to follow up with her primary care physician regarding this soft tissue infection. She was to follow-up with Dr. Darrick Penna on an as-needed basis.  Pt states her Raynaud's Disease and is the reason she has had issues with her toes, and polymyositis is the primary reason for her debilitation.   Pt Diabetic: Yes, apparently secondary to corticosteroid use Pt smoker: non-smoker  Pt meds include: Statin :No Betablocker: Yes ASA: No Other anticoagulants/antiplatelets: no  Past Medical History:  Diagnosis Date  . Anemia   . Anxiety   . Constipation   . Depression   . Fibromyalgia   . GERD (gastroesophageal reflux disease)   . Headache(784.0)   . Hypertension   . Osteoarthritis   . Osteopenia   . Peripheral vascular disease (HCC)   . Pneumonia 03/2012  . Polymyositis (HCC)   . Polymyositis (HCC)   . Polymyositis (HCC)   . Raynaud phenomenon   . Rheumatoid arthritis (HCC)   . Seizures (HCC)    with chid birth 15f 2 childre  . Shortness of breath    with exertion  . Type II diabetes mellitus (HCC)    on no medications    Social History Social History  Substance Use  Topics  . Smoking status: Never Smoker  . Smokeless tobacco: Never Used  . Alcohol use No     Comment: 05/19/2015 "might have a couple drinks/yr"    Family History Family History  Problem Relation Age of Onset  . Diabetes Mother   . Hypertension Mother   . Diabetes Father   . Heart disease Father   . Hypertension Sister   . Heart disease Sister     Past Surgical History:  Procedure Laterality Date  . ABDOMINAL AORTAGRAM N/A 07/25/2014   Procedure: ABDOMINAL Ronny Flurry;  Surgeon: Sherren Kerns, MD;  Location: [facility] CATH LAB;  Service: Cardiovascular;  Laterality: N/A;  . AMPUTATION Left 12/24/2012   Procedure: AMPUTATION DIGIT left great toe;  Surgeon: Sherren Kerns, MD;  Location: [facility] OR;  Service:  Vascular;  Laterality: Left;  GREAT  . AMPUTATION Right 05/19/2015   Procedure: AMPUTATION DIGIT;  Surgeon: Sherren Kerns, MD;  Location: [facility] OR;  Service: Vascular;  Laterality: Right;  . AMPUTATION Right 07/22/2015   Procedure: RAY AMPUTATION OF RIGHT GREAT TOE AMPUTATION SITE;  Surgeon: Sherren Kerns, MD;  Location: [facility] OR;  Service: Vascular;  Laterality: Right;  . APPLICATION OF A-CELL OF EXTREMITY Right 11/12/2015   Procedure: INTEGRA;  Surgeon: Alena Bills Dillingham, DO;  Location: [facility] OR;  Service: Plastics;  Laterality: Right;  . CESAREAN SECTION     x3  . COLONOSCOPY    . HYSTEROSCOPY     w/ D&C with excision of endometrial polyps  . I&D EXTREMITY Right 10/26/2015   Procedure: IRRIGATION AND DEBRIDEMENT EXTREMITY/RIGHT FOOT;  Surgeon: Fransisco Hertz, MD;  Location: [facility] OR;  Service: Vascular;  Laterality: Right;  . I&D EXTREMITY Right 10/29/2015   Procedure: IRRIGATION AND DEBRIDEMENT RIGHT FOOT WITH ACELL AND VAC PLACEMENT;  Surgeon: Alena Bills Dillingham, DO;  Location: [facility] OR;  Service: Plastics;  Laterality: Right;  . I&D EXTREMITY Right 11/12/2015   Procedure: IRRIGATION AND DEBRIDEMENT RIGHT EXTREMITY;  Surgeon: Alena Bills Dillingham, DO;  Location: [facility] OR;  Service: Plastics;  Laterality: Right;  . IRRIGATION AND DEBRIDEMENT FOOT Right 10/26/2015  . left great toenail removal  Left 2013   done by [place] [facility]  . MINOR APPLICATION OF WOUND VAC Right 11/12/2015   Procedure: MINOR APPLICATION OF WOUND VAC;  Surgeon: Alena Bills Dillingham, DO;  Location: [facility] OR;  Service: Plastics;  Laterality: Right;  . PERIPHERAL VASCULAR CATHETERIZATION N/A 05/15/2015   Procedure: Abdominal Aortogram;  Surgeon: Sherren Kerns, MD;  Location: [facility] INVASIVE CV LAB;  Service: Cardiovascular;  Laterality: N/A;  . SKIN SPLIT GRAFT Right 12/02/2015   Procedure: SKIN GRAFT SPLIT THICKNESS RIGHT FOOT WITH PLACEMENT VAC DRESSING;  Surgeon: Alena Bills Dillingham, DO;  Location: [facility] OR;  Service: Plastics;  Laterality: Right;   . TONSILLECTOMY Bilateral   . TUBAL LIGATION      Allergies  Allergen Reactions  . Clindamycin/Lincomycin Itching and Rash    Possible reaction (took clindamycin and cipro at the same time)  . Doxycycline Nausea Only  . Percocet [Oxycodone-Acetaminophen] Itching  . Ciprofloxacin Itching and Rash    Possible reaction (took clindamycin and cipro at the same time)   . Mycophenolate Diarrhea    Current Outpatient Prescriptions  Medication Sig Dispense Refill  . Amino Acids-Protein Hydrolys (FEEDING SUPPLEMENT, PRO-STAT SUGAR FREE 64,) LIQD Take 30 mLs by mouth 2 (two) times daily.    Marland Kitchen amLODipine (NORVASC) 5 MG tablet Take 5 mg by mouth daily.    . Cholecalciferol  1000 units tablet Take 1,000 Units by mouth daily.    . clonazePAM (KLONOPIN) 0.5 MG tablet Take 0.5 mg by mouth every 8 (eight) hours as needed for anxiety. Stop date 03/14/17    . DULoxetine (CYMBALTA) 20 MG capsule Take 60 mg by mouth daily.     . famotidine (PEPCID) 20 MG tablet Take 20 mg by mouth 2 (two) times daily.    . heparin 5,000 Units in sodium chloride 0.9 % 1,000 mL Inject 1 mL as directed every 8 (eight) hours.    . Melatonin 3 MG TABS Take 3 mg by mouth at bedtime.    . metoprolol tartrate (LOPRESSOR) 25 MG tablet Take 25 mg by mouth every 8 (eight) hours.    . Multiple Vitamin (MULTIVITAMIN) tablet Take 1 tablet by mouth daily.    . nitrofurantoin (MACRODANTIN) 100 MG capsule Take 100 mg by mouth 2 (two) times daily.    . Omega-3 Fatty Acids (FISH OIL) 1000 MG CAPS Take 1 capsule by mouth daily.    Marland Kitchen oxycodone (OXY-IR) 5 MG capsule Take 2.5 mg by mouth 3 (three) times daily.    . predniSONE (DELTASONE) 5 MG tablet Take 5 mg by mouth daily with breakfast.    . Psyllium (REGULOID PO) Take by mouth. Give 1 tablespoon by mouth 2 times daily    . risperiDONE (RISPERDAL) 0.25 MG tablet Take 0.5 mg by mouth at bedtime.     . sennosides-docusate sodium (SENOKOT-S) 8.6-50 MG tablet Take 1 tablet by mouth at bedtime.     . Skin Protectants, Misc. (EUCERIN) cream Apply 1 application topically daily.    . tacrolimus (PROGRAF) 1 MG capsule Take 2 mg by mouth 2 (two) times daily.    Marland Kitchen UNABLE TO FIND Med Name: Med pass 120 mL by mouth 2 times daily     No current facility-administered medications for this visit.     ROS: See HPI for pertinent positives and negatives.   Physical Examination  Vitals:   04/10/17 1109  BP: 120/86  Pulse: 76  Resp: 16  Temp: 97.8 F (36.6 C)  TempSrc: Oral  Height: 5\' 1"  (1.549 m)   There is no height or weight on file to calculate BMI.  General: A&O x 3, WDWN, thin female. Gait: seated in w/c Eyes: PERRLA. Pulmonary: Respirations are slightly labored at rest, fair air movement, fine rales in lower half of posterior fields.  Cardiac: regular rhythm, tachycardic at 120/min, no detected murmur.         Carotid Bruits Right Left   Negative Negative   Radial pulses are 1+ palpable bilaterally   Adominal aortic pulse is faintly palpable                         VASCULAR EXAM: Extremities without ischemic changes, without Gangrene; with open wound: left 5th metatarsal, lateral aspect, about 1.5 cm x 1.5 cm, about 2-3 mm deep, surrounded by mild swelling and erythema, no drainage.  Right great toe is surgically absent.  LE Pulses Right Left       FEMORAL  2+ palpable  2+ palpable        POPLITEAL  not palpable   not palpable       POSTERIOR TIBIAL  + Doppler signal and not palpable   + Doppler signal not palpable        DORSALIS PEDIS      ANTERIOR TIBIAL + Doppler signal and not palpable  + Doppler signal and not palpable    Abdomen: soft, NT, no palpable masses. Skin: Multiple small and medium sized dry nodules noted on both upper arms, see Extremities.  Musculoskeletal: no muscle wasting or atrophy.  Neurologic: A&O X 3; Appropriate Affect ;  SENSATION: normal; MOTOR FUNCTION:  moving all extremities equally, motor strength 5/5 throughout. Speech is fluent/normal. CN 2-12 intact.    05-15-15 Aortogram by Dr. Darrick Penna: In the left lower extremity, the left common femoral profunda femoris and superficial femoral arteries are widely patent. The left popliteal artery is widely patent. There is three-vessel runoff to the left foot. However the tibial vessels are very diminutive from the ankle down into the foot.  In the right lower extremity, the right common femoral superficial femoral and profunda femoris arteries are widely patent. The popliteal artery is patent. There is three-vessel runoff to the right foot. However there was not very good contrast opacification.end of the foot. Therefore the pigtail catheter was removed over a guidewire and replaced with a 5 [place] crossover catheter. This was used to selectively catheterize the right common iliac artery. The guidewire was then advanced down into the right superficial femoral artery. The crossover catheter was removed and replaced with a 5 French straight catheter. Contrast injection was then performed into the right superficial femoral artery to further opacify the vessels to the right foot and digital arteries. On this view the distal segments of the anterior tibial posterior tibial and peroneal arteries are all patent. However there is obliteration of the distal digital vessels throughout the foot diffusely. At this point the 5 French straight catheter was removed and the 5 French sheath left in place to be pulled in the holding area. The patient tolerated the procedure well and there were no complications. The patient was taken to the holding area in stable condition. 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 All of her larger vessels are patent. She will be scheduled for a right first toe amputation next week.   ASSESSMENT: TYNE BANTA is a 56  y.o. female who is s/p resection of her right first metatarsal head and drainage of an abscess in her foot on July 22, 2015. She developed a painful swelling at the lateral aspect of her left 5th metatarsal about 3 weeks ago, woke up with this which she thinks may be a snake bite since she states she saw snakes in the place she was staying at the [facility].  She reports being given a course of po antibiotics recently and thinks the erythema has decreased somewhat, but the ulcer is still quite painful. She is scheduled for an MRI of her left foot on 04-18-17.  I discussed with Dr. Myra Gianotti pt HPI and physical exam results. There are + Doppler signals in both DP and PT arteries.  See Plan.  PLAN:  Based on the patient's vascular studies and examination, and after discussing with Dr. Myra Gianotti, pt will return to clinic ASAP for bilateral ABI and left lower extremity arterial duplex, see Dr. Darrick Penna  afterward.  I discussed in depth with the patient the nature of atherosclerosis, and emphasized the importance of maximal medical management including strict control of blood pressure, blood glucose, and lipid levels, obtaining regular exercise, and continued cessation of smoking.  The patient is aware that without maximal medical management the underlying atherosclerotic disease process will progress, limiting the benefit of any interventions.  The patient was given information about PAD including signs, symptoms, treatment, what symptoms should prompt the patient to seek immediate medical care, and risk reduction measures to take.  Charisse March, RN, MSN, FNP-C [facility] [place] Phone: [phone]  Clinic MD: Myra Gianotti  04/10/17 11:32 AM

## 2017-04-11 NOTE — Addendum Note (Signed)
Addended by: Burton Apley A on: 04/11/2017 03:23 PM   Modules accepted: Orders

## 2017-04-12 ENCOUNTER — Encounter: Payer: Self-pay | Admitting: Internal Medicine

## 2017-04-18 ENCOUNTER — Ambulatory Visit
Admission: RE | Admit: 2017-04-18 | Discharge: 2017-04-18 | Disposition: A | Discharge status: Home / Self Care | Payer: BLUE CROSS/BLUE SHIELD | Source: Ambulatory Visit | Attending: Physician Assistant | Admitting: Physician Assistant

## 2017-04-18 ENCOUNTER — Other Ambulatory Visit: Payer: Self-pay | Admitting: Physician Assistant

## 2017-04-18 ENCOUNTER — Other Ambulatory Visit: Payer: Self-pay

## 2017-04-18 DIAGNOSIS — L89890 Pressure ulcer of other site, unstageable: Secondary | ICD-10-CM

## 2017-05-03 ENCOUNTER — Encounter: Payer: Self-pay | Admitting: Vascular Surgery

## 2017-05-11 ENCOUNTER — Other Ambulatory Visit: Payer: Self-pay | Admitting: *Deleted

## 2017-05-11 ENCOUNTER — Encounter: Payer: Self-pay | Admitting: Vascular Surgery

## 2017-05-11 ENCOUNTER — Ambulatory Visit (HOSPITAL_COMMUNITY)
Admission: RE | Admit: 2017-05-11 | Discharge: 2017-05-11 | Disposition: A | Discharge status: Home / Self Care | Payer: BLUE CROSS/BLUE SHIELD | Source: Ambulatory Visit | Attending: Vascular Surgery | Admitting: Vascular Surgery

## 2017-05-11 ENCOUNTER — Ambulatory Visit (INDEPENDENT_AMBULATORY_CARE_PROVIDER_SITE_OTHER): Payer: BLUE CROSS/BLUE SHIELD | Admitting: Vascular Surgery

## 2017-05-11 ENCOUNTER — Ambulatory Visit (INDEPENDENT_AMBULATORY_CARE_PROVIDER_SITE_OTHER)
Admission: RE | Admit: 2017-05-11 | Discharge: 2017-05-11 | Disposition: A | Discharge status: Home / Self Care | Payer: BLUE CROSS/BLUE SHIELD | Source: Ambulatory Visit | Attending: Vascular Surgery | Admitting: Vascular Surgery

## 2017-05-11 VITALS — BP 110/75 | HR 89 | Temp 97.3°F | Resp 16 | Ht 61.0 in | Wt 115.0 lb

## 2017-05-11 DIAGNOSIS — I739 Peripheral vascular disease, unspecified: Secondary | ICD-10-CM

## 2017-05-11 DIAGNOSIS — L97529 Non-pressure chronic ulcer of other part of left foot with unspecified severity: Secondary | ICD-10-CM

## 2017-05-11 DIAGNOSIS — M869 Osteomyelitis, unspecified: Secondary | ICD-10-CM

## 2017-05-11 NOTE — Progress Notes (Signed)
Referring Physician: Charlynne Pander, MD  Patient name: Allison Beasley MRN: [id number] DOB: [birth year] Sex: female  REASON FOR CONSULT: Osteomyelitis left fifth toe  HPI: Allison Beasley is a 56 y.o. female well-known to me from multiple prior nonhealing wounds of her feet secondary to autoimmune disease/polymyositis/diabetes. The patient underwent amputation of her left first toe in 2014. She had amputation of her right first toe in 2016. Her underlying disease process has progressed to the point where she has significant calcification subcutaneously in her arms and back. She is essentially wheelchair-bound at this point. She is currently on prednisone and tacrolimus for immunosuppression. She developed a wound on the lateral aspect of her left foot which has now had continuous clear drainage. She denies any fever or chills. She had an MRI scan done of the left foot a few weeks ago which shows osteomyelitis in the left fifth metatarsal head. She has had multiple prior arteriograms which showed no evidence of large vessel occlusive disease but obliteration of digital vessels. Other medical problems include hypertension, arthritis, fibromyalgia, depression, anxiety, anemia all of which have been stable.  Past Medical History:  Diagnosis Date  . Anemia   . Anxiety   . Constipation   . Depression   . Fibromyalgia   . GERD (gastroesophageal reflux disease)   . Headache(784.0)   . Hypertension   . Osteoarthritis   . Osteopenia   . Peripheral vascular disease (HCC)   . Pneumonia 03/2012  . Polymyositis (HCC)   . Polymyositis (HCC)   . Polymyositis (HCC)   . Raynaud phenomenon   . Rheumatoid arthritis (HCC)   . Seizures (HCC)    with chid birth 19f 2 childre  . Shortness of breath    with exertion  . Type II diabetes mellitus (HCC)    on no medications   Past Surgical History:  Procedure Laterality Date  . ABDOMINAL AORTAGRAM N/A 07/25/2014   Procedure: ABDOMINAL Ronny Flurry;  Surgeon: Sherren Kerns, MD;  Location: [facility] CATH LAB;  Service: Cardiovascular;  Laterality: N/A;  . AMPUTATION Left 12/24/2012   Procedure: AMPUTATION DIGIT left great toe;  Surgeon: Sherren Kerns, MD;  Location: [facility] OR;  Service: Vascular;  Laterality: Left;  GREAT  . AMPUTATION Right 05/19/2015   Procedure: AMPUTATION DIGIT;  Surgeon: Sherren Kerns, MD;  Location: [facility] OR;  Service: Vascular;  Laterality: Right;  . AMPUTATION Right 07/22/2015   Procedure: RAY AMPUTATION OF RIGHT GREAT TOE AMPUTATION SITE;  Surgeon: Sherren Kerns, MD;  Location: [facility] OR;  Service: Vascular;  Laterality: Right;  . APPLICATION OF A-CELL OF EXTREMITY Right 11/12/2015   Procedure: INTEGRA;  Surgeon: Alena Bills Dillingham, DO;  Location: [facility] OR;  Service: Plastics;  Laterality: Right;  . CESAREAN SECTION     x3  . COLONOSCOPY    . HYSTEROSCOPY     w/ D&C with excision of endometrial polyps  . I&D EXTREMITY Right 10/26/2015   Procedure: IRRIGATION AND DEBRIDEMENT EXTREMITY/RIGHT FOOT;  Surgeon: Fransisco Hertz, MD;  Location: [facility] OR;  Service: Vascular;  Laterality: Right;  . I&D EXTREMITY Right 10/29/2015   Procedure: IRRIGATION AND DEBRIDEMENT RIGHT FOOT WITH ACELL AND VAC PLACEMENT;  Surgeon: Alena Bills Dillingham, DO;  Location: [facility] OR;  Service: Plastics;  Laterality: Right;  . I&D EXTREMITY Right 11/12/2015   Procedure: IRRIGATION AND DEBRIDEMENT RIGHT EXTREMITY;  Surgeon: Alena Bills Dillingham, DO;  Location: [facility] OR;  Service: Plastics;  Laterality: Right;  . IRRIGATION AND DEBRIDEMENT  FOOT Right 10/26/2015  . left great toenail removal  Left 2013   done by [place] [facility]  . MINOR APPLICATION OF WOUND VAC Right 11/12/2015   Procedure: MINOR APPLICATION OF WOUND VAC;  Surgeon: Alena Bills Dillingham, DO;  Location: [facility] OR;  Service: Plastics;  Laterality: Right;  . PERIPHERAL VASCULAR CATHETERIZATION N/A 05/15/2015   Procedure: Abdominal Aortogram;  Surgeon: Sherren Kerns, MD;  Location: [facility] INVASIVE CV LAB;  Service: Cardiovascular;   Laterality: N/A;  . SKIN SPLIT GRAFT Right 12/02/2015   Procedure: SKIN GRAFT SPLIT THICKNESS RIGHT FOOT WITH PLACEMENT VAC DRESSING;  Surgeon: Alena Bills Dillingham, DO;  Location: [facility] OR;  Service: Plastics;  Laterality: Right;  . TONSILLECTOMY Bilateral   . TUBAL LIGATION      Family History  Problem Relation Age of Onset  . Diabetes Mother   . Hypertension Mother   . Diabetes Father   . Heart disease Father   . Hypertension Sister   . Heart disease Sister     SOCIAL HISTORY: Social History   Social History  . Marital status: Married    Spouse name: N/A  . Number of children: N/A  . Years of education: N/A   Occupational History  . Not on file.   Social History Main Topics  . Smoking status: Never Smoker  . Smokeless tobacco: Never Used  . Alcohol use No     Comment: 05/19/2015 "might have a couple drinks/yr"  . Drug use: No  . Sexual activity: Not on file   Other Topics Concern  . Not on file   Social History Narrative  . No narrative on file    Allergies  Allergen Reactions  . Clindamycin/Lincomycin Itching and Rash    Possible reaction (took clindamycin and cipro at the same time)  . Doxycycline Nausea Only  . Percocet [Oxycodone-Acetaminophen] Itching  . Ciprofloxacin Itching and Rash    Possible reaction (took clindamycin and cipro at the same time)   . Mycophenolate Diarrhea    Current Outpatient Prescriptions  Medication Sig Dispense Refill  . Amino Acids-Protein Hydrolys (FEEDING SUPPLEMENT, PRO-STAT SUGAR FREE 64,) LIQD Take 30 mLs by mouth 2 (two) times daily.    Marland Kitchen amLODipine (NORVASC) 5 MG tablet Take 5 mg by mouth daily.    . Cholecalciferol 1000 units tablet Take 1,000 Units by mouth daily.    . clonazePAM (KLONOPIN) 0.5 MG tablet Take 0.5 mg by mouth every 8 (eight) hours as needed for anxiety. Stop date 03/14/17    . DULoxetine (CYMBALTA) 20 MG capsule Take 60 mg by mouth daily.     . famotidine (PEPCID) 20 MG tablet Take 20 mg by mouth 2  (two) times daily.    . heparin 5,000 Units in sodium chloride 0.9 % 1,000 mL Inject 1 mL as directed every 8 (eight) hours.    . Melatonin 3 MG TABS Take 3 mg by mouth at bedtime.    . metoprolol tartrate (LOPRESSOR) 25 MG tablet Take 25 mg by mouth every 8 (eight) hours.    . Multiple Vitamin (MULTIVITAMIN) tablet Take 1 tablet by mouth daily.    . nitrofurantoin (MACRODANTIN) 100 MG capsule Take 100 mg by mouth 2 (two) times daily.    . Omega-3 Fatty Acids (FISH OIL) 1000 MG CAPS Take 1 capsule by mouth daily.    Marland Kitchen oxycodone (OXY-IR) 5 MG capsule Take 2.5 mg by mouth 3 (three) times daily.    . predniSONE (DELTASONE) 5 MG tablet Take 5 mg  by mouth daily with breakfast.    . Psyllium (REGULOID PO) Take by mouth. Give 1 tablespoon by mouth 2 times daily    . risperiDONE (RISPERDAL) 0.25 MG tablet Take 0.5 mg by mouth at bedtime.     . sennosides-docusate sodium (SENOKOT-S) 8.6-50 MG tablet Take 1 tablet by mouth at bedtime.    . Skin Protectants, Misc. (EUCERIN) cream Apply 1 application topically daily.    . tacrolimus (PROGRAF) 1 MG capsule Take 2 mg by mouth 2 (two) times daily.    Marland Kitchen UNABLE TO FIND Med Name: Med pass 120 mL by mouth 2 times daily     No current facility-administered medications for this visit.     ROS:   General:  No weight loss, Fever, chills  HEENT: No recent headaches, no nasal bleeding, no visual changes, no sore throat  Neurologic: No dizziness, blackouts, seizures. No recent symptoms of stroke or mini- stroke. No recent episodes of slurred speech, or temporary blindness.  Cardiac: No recent episodes of chest pain/pressure, no shortness of breath at rest.  + shortness of breath with exertion.  Denies history of atrial fibrillation or irregular heartbeat  Vascular: No history of rest pain in feet.  No history of claudication.  + history of non-healing ulcer, No history of DVT   Pulmonary: No home oxygen, no productive cough, no hemoptysis,  No asthma or  wheezing  Musculoskeletal:  [X]  Arthritis, [X]  Low back pain,  [X]  Joint pain  Hematologic:No history of hypercoagulable state.  No history of easy bleeding.  +history of anemia  Gastrointestinal: No hematochezia or melena,  No gastroesophageal reflux, no trouble swallowing  Urinary: [ ]  chronic Kidney disease, [ ]  on HD - [ ]  MWF or [ ]  TTHS, [ ]  Burning with urination, [ ]  Frequent urination, [ ]  Difficulty urinating;   Skin: No rashes  Psychological: No history of anxiety,  No history of depression   Physical Examination  Vitals:   05/11/17 1211  BP: 110/75  Pulse: 89  Resp: 16  Temp: (!) 97.3 F (36.3 C)  TempSrc: Oral  Weight: 115 lb (52.2 kg)  Height: 5\' 1"  (1.549 m)    Body mass index is 21.73 kg/m.  General:  Alert and oriented, no acute distress HEENT: Normal Neck: No bruit or JVD Pulmonary: Clear to auscultation bilaterally Cardiac: Regular Rate and Rhythm without murmur Abdomen: Soft, non-tender, non-distended, no mass Skin: No rash, open ulceration lateral aspect left fifth metatarsal with no significant surrounding erythema Extremity Pulses:  2+ radial, brachial, femoral, absent dorsalis pedis, posterior tibial pulses bilaterally Musculoskeletal: No  edema  Neurologic: Upper and lower extremity motor 5/5 and symmetric  DATA:  Patient had a left lower extremity arterial duplex exam today which showed patent left lower extremity with no focal stenosis calcified vessels triphasic Doppler flow except for in the distal arterial tree which was monophasic consistent with her prior arteriogram  ASSESSMENT:  Patient with osteomyelitis left fifth metatarsal head. She will need amputation of her left fifth digit with resection of the metatarsal head. She should have adequate perfusion to heal this. However she is on chronic immunosuppression and this may make wound healing difficulties. She would be possibly at risk for below-knee amputation if this does not  heal.   PLAN:  Risks benefits possible palpitations and procedure details of left fifth toe amputation with resection of metatarsal head discussed with the patient today. She understands and agrees to proceed. This is scheduled for 05/17/2017.  Ruta Hinds, MD [facility] [place] Office: [phone] Pager: [phone]

## 2017-05-12 ENCOUNTER — Encounter: Payer: Self-pay | Admitting: *Deleted

## 2017-05-15 ENCOUNTER — Encounter (HOSPITAL_COMMUNITY): Payer: Self-pay | Admitting: *Deleted

## 2017-05-16 ENCOUNTER — Encounter (HOSPITAL_COMMUNITY): Payer: Self-pay | Admitting: *Deleted

## 2017-05-16 NOTE — Pre-Procedure Instructions (Cosign Needed)
    Allison Beasley  05/16/2017     Your procedure is scheduled on Wednesday, July 24.   Report to [facility] Admitting at 8:30 AM                    Procedure is scheduled for 10: 55 AM   Call this number if you have problems the morning of surgery: [phone] (pre- op desk)     Remember:  Do not eat food or drink liquids after midnight.   Take these medicines the morning of surgery with A SIP OF WATER: Amlodipine, Predisone,  Cymbalta,  Metoprolol,  Pepcid,  Tacrolimus.  Oxycodone if needed.                      Please send Medication Record with medication that were given documented.

## 2017-05-17 ENCOUNTER — Ambulatory Visit (HOSPITAL_COMMUNITY): Payer: BLUE CROSS/BLUE SHIELD | Admitting: Anesthesiology

## 2017-05-17 ENCOUNTER — Encounter (HOSPITAL_COMMUNITY)
Admission: RE | Disposition: A | Discharge status: Skilled Nursing Facility | Payer: Self-pay | Source: Ambulatory Visit | Attending: Vascular Surgery

## 2017-05-17 ENCOUNTER — Observation Stay (HOSPITAL_COMMUNITY)
Admission: RE | Admit: 2017-05-17 | Discharge: 2017-05-19 | Disposition: A | Discharge status: Skilled Nursing Facility | Payer: BLUE CROSS/BLUE SHIELD | Source: Ambulatory Visit | Attending: Vascular Surgery | Admitting: Vascular Surgery

## 2017-05-17 ENCOUNTER — Encounter (HOSPITAL_COMMUNITY): Payer: Self-pay | Admitting: *Deleted

## 2017-05-17 DIAGNOSIS — Z7952 Long term (current) use of systemic steroids: Secondary | ICD-10-CM | POA: Diagnosis not present

## 2017-05-17 DIAGNOSIS — M069 Rheumatoid arthritis, unspecified: Secondary | ICD-10-CM | POA: Insufficient documentation

## 2017-05-17 DIAGNOSIS — K219 Gastro-esophageal reflux disease without esophagitis: Secondary | ICD-10-CM | POA: Insufficient documentation

## 2017-05-17 DIAGNOSIS — M3320 Polymyositis, organ involvement unspecified: Secondary | ICD-10-CM | POA: Insufficient documentation

## 2017-05-17 DIAGNOSIS — L8990 Pressure ulcer of unspecified site, unspecified stage: Secondary | ICD-10-CM | POA: Insufficient documentation

## 2017-05-17 DIAGNOSIS — Z89412 Acquired absence of left great toe: Secondary | ICD-10-CM | POA: Diagnosis not present

## 2017-05-17 DIAGNOSIS — Z993 Dependence on wheelchair: Secondary | ICD-10-CM | POA: Diagnosis not present

## 2017-05-17 DIAGNOSIS — M797 Fibromyalgia: Secondary | ICD-10-CM | POA: Insufficient documentation

## 2017-05-17 DIAGNOSIS — I7301 Raynaud's syndrome with gangrene: Secondary | ICD-10-CM | POA: Diagnosis not present

## 2017-05-17 DIAGNOSIS — F329 Major depressive disorder, single episode, unspecified: Secondary | ICD-10-CM | POA: Diagnosis not present

## 2017-05-17 DIAGNOSIS — E1152 Type 2 diabetes mellitus with diabetic peripheral angiopathy with gangrene: Secondary | ICD-10-CM | POA: Insufficient documentation

## 2017-05-17 DIAGNOSIS — Z89411 Acquired absence of right great toe: Secondary | ICD-10-CM | POA: Diagnosis not present

## 2017-05-17 DIAGNOSIS — F419 Anxiety disorder, unspecified: Secondary | ICD-10-CM | POA: Insufficient documentation

## 2017-05-17 DIAGNOSIS — E1169 Type 2 diabetes mellitus with other specified complication: Principal | ICD-10-CM | POA: Insufficient documentation

## 2017-05-17 DIAGNOSIS — I10 Essential (primary) hypertension: Secondary | ICD-10-CM | POA: Diagnosis not present

## 2017-05-17 DIAGNOSIS — I739 Peripheral vascular disease, unspecified: Secondary | ICD-10-CM | POA: Diagnosis present

## 2017-05-17 DIAGNOSIS — Z79899 Other long term (current) drug therapy: Secondary | ICD-10-CM | POA: Diagnosis not present

## 2017-05-17 DIAGNOSIS — M869 Osteomyelitis, unspecified: Secondary | ICD-10-CM | POA: Diagnosis not present

## 2017-05-17 DIAGNOSIS — M86172 Other acute osteomyelitis, left ankle and foot: Secondary | ICD-10-CM | POA: Diagnosis not present

## 2017-05-17 HISTORY — DX: Sepsis, unspecified organism: A41.9

## 2017-05-17 HISTORY — DX: Pressure ulcer of unspecified site, unspecified stage: L89.90

## 2017-05-17 HISTORY — DX: Peripheral vascular disease, unspecified: I73.9

## 2017-05-17 HISTORY — PX: TOE AMPUTATION: SHX809

## 2017-05-17 HISTORY — PX: AMPUTATION: SHX166

## 2017-05-17 HISTORY — DX: Unspecified open wound of left buttock, initial encounter: S31.829A

## 2017-05-17 HISTORY — DX: Pressure ulcer of unspecified buttock, unspecified stage: L89.309

## 2017-05-17 HISTORY — DX: Interstitial pulmonary disease, unspecified: J84.9

## 2017-05-17 HISTORY — DX: Hemophilus influenzae infection, unspecified site: A49.2

## 2017-05-17 LAB — BASIC METABOLIC PANEL
ANION GAP: 9 (ref 5–15)
BUN: 15 mg/dL (ref 6–20)
CHLORIDE: 109 mmol/L (ref 101–111)
CO2: 23 mmol/L (ref 22–32)
Calcium: 9 mg/dL (ref 8.9–10.3)
Creatinine, Ser: 0.55 mg/dL (ref 0.44–1.00)
GFR calc Af Amer: >60 mL/min (ref >60)
GFR calc non Af Amer: >60 mL/min (ref >60)
Glucose, Bld: 108 mg/dL — ABNORMAL HIGH (ref 65–99)
POTASSIUM: 3.8 mmol/L (ref 3.5–5.1)
SODIUM: 141 mmol/L (ref 135–145)

## 2017-05-17 LAB — CBC
HEMATOCRIT: 34.4 % — AB (ref 36.0–46.0)
HEMOGLOBIN: 10.4 g/dL — AB (ref 12.0–15.0)
MCH: 26.8 pg (ref 26.0–34.0)
MCHC: 30.2 g/dL (ref 30.0–36.0)
MCV: 88.7 fL (ref 78.0–100.0)
Platelets: 366 10*3/uL (ref 150–400)
RBC: 3.88 MIL/uL (ref 3.87–5.11)
RDW: 17.9 % — AB (ref 11.5–15.5)
WBC: 11.5 10*3/uL — AB (ref 4.0–10.5)

## 2017-05-17 LAB — GLUCOSE, CAPILLARY
GLUCOSE-CAPILLARY: 111 mg/dL — AB (ref 65–99)
Glucose-Capillary: 64 mg/dL — ABNORMAL LOW (ref 65–99)
Glucose-Capillary: 73 mg/dL (ref 65–99)

## 2017-05-17 SURGERY — AMPUTATION DIGIT
Anesthesia: Monitor Anesthesia Care | Site: Foot | Laterality: Left

## 2017-05-17 MED ORDER — SODIUM CHLORIDE 0.9 % IV SOLN: INTRAVENOUS | Status: DC

## 2017-05-17 MED ORDER — MIDAZOLAM HCL 2 MG/2ML IJ SOLN
INTRAMUSCULAR | Status: AC
  Filled 2017-05-17: qty 2

## 2017-05-17 MED ORDER — LABETALOL HCL 5 MG/ML IV SOLN
10.0000 mg | INTRAVENOUS | Status: DC | PRN
  Filled 2017-05-17: qty 4

## 2017-05-17 MED ORDER — ENSURE ENLIVE PO LIQD
237.0000 mL | Freq: Two times a day (BID) | ORAL | Status: DC
  Administered 2017-05-18 (×2): 237 mL via ORAL

## 2017-05-17 MED ORDER — FENTANYL CITRATE (PF) 100 MCG/2ML IJ SOLN
50.0000 ug | INTRAMUSCULAR | Status: DC | PRN
  Administered 2017-05-17: 50 ug via INTRAVENOUS

## 2017-05-17 MED ORDER — FENTANYL CITRATE (PF) 100 MCG/2ML IJ SOLN
INTRAMUSCULAR | Status: DC | PRN
  Administered 2017-05-17 (×2): 25 ug via INTRAVENOUS

## 2017-05-17 MED ORDER — FAMOTIDINE 20 MG PO TABS
20.0000 mg | ORAL_TABLET | Freq: Two times a day (BID) | ORAL | Status: DC
  Administered 2017-05-17 – 2017-05-19 (×4): 20 mg via ORAL
  Filled 2017-05-17 (×4): qty 1

## 2017-05-17 MED ORDER — MIDAZOLAM HCL 5 MG/5ML IJ SOLN
INTRAMUSCULAR | Status: DC | PRN
  Administered 2017-05-17: 2 mg via INTRAVENOUS

## 2017-05-17 MED ORDER — SODIUM CHLORIDE 0.9% FLUSH
3.0000 mL | Freq: Two times a day (BID) | INTRAVENOUS | Status: DC
  Administered 2017-05-17 – 2017-05-19 (×2): 3 mL via INTRAVENOUS

## 2017-05-17 MED ORDER — FENTANYL CITRATE (PF) 100 MCG/2ML IJ SOLN
INTRAMUSCULAR | Status: AC
  Administered 2017-05-17: 50 ug via INTRAVENOUS
  Filled 2017-05-17: qty 2

## 2017-05-17 MED ORDER — AMLODIPINE BESYLATE 5 MG PO TABS: 5.0000 mg | ORAL_TABLET | Freq: Every day | ORAL | Status: DC

## 2017-05-17 MED ORDER — MIDAZOLAM HCL 2 MG/2ML IJ SOLN
INTRAMUSCULAR | Status: AC
  Administered 2017-05-17: 1 mg via INTRAVENOUS
  Filled 2017-05-17: qty 2

## 2017-05-17 MED ORDER — HYDRALAZINE HCL 20 MG/ML IJ SOLN: 5.0000 mg | INTRAMUSCULAR | Status: DC | PRN

## 2017-05-17 MED ORDER — EUCERIN EX CREA: 1.0000 "application " | TOPICAL_CREAM | Freq: Every day | CUTANEOUS | Status: DC

## 2017-05-17 MED ORDER — ADULT MULTIVITAMIN W/MINERALS CH
1.0000 | ORAL_TABLET | Freq: Every day | ORAL | Status: DC
  Administered 2017-05-18 – 2017-05-19 (×2): 1 via ORAL
  Filled 2017-05-17 (×2): qty 1

## 2017-05-17 MED ORDER — ALUM & MAG HYDROXIDE-SIMETH 200-200-20 MG/5ML PO SUSP: 15.0000 mL | ORAL | Status: DC | PRN

## 2017-05-17 MED ORDER — ACETAMINOPHEN 325 MG RE SUPP
325.0000 mg | RECTAL | Status: DC | PRN
  Filled 2017-05-17: qty 2

## 2017-05-17 MED ORDER — PROMETHAZINE HCL 25 MG/ML IJ SOLN: 6.2500 mg | INTRAMUSCULAR | Status: DC | PRN

## 2017-05-17 MED ORDER — FENTANYL CITRATE (PF) 100 MCG/2ML IJ SOLN: 25.0000 ug | INTRAMUSCULAR | Status: DC | PRN

## 2017-05-17 MED ORDER — VITAMIN D 1000 UNITS PO TABS
1000.0000 [IU] | ORAL_TABLET | Freq: Every day | ORAL | Status: DC
  Administered 2017-05-18 – 2017-05-19 (×2): 1000 [IU] via ORAL
  Filled 2017-05-17 (×2): qty 1

## 2017-05-17 MED ORDER — FENTANYL CITRATE (PF) 250 MCG/5ML IJ SOLN
INTRAMUSCULAR | Status: AC
  Filled 2017-05-17: qty 5

## 2017-05-17 MED ORDER — SODIUM CHLORIDE 0.9 % IV SOLN: 250.0000 mL | INTRAVENOUS | Status: DC | PRN

## 2017-05-17 MED ORDER — ONDANSETRON HCL 4 MG/2ML IJ SOLN: 4.0000 mg | Freq: Four times a day (QID) | INTRAMUSCULAR | Status: DC | PRN

## 2017-05-17 MED ORDER — TACROLIMUS 1 MG PO CAPS
2.0000 mg | ORAL_CAPSULE | Freq: Two times a day (BID) | ORAL | Status: DC
  Administered 2017-05-17 – 2017-05-19 (×4): 2 mg via ORAL
  Filled 2017-05-17 (×4): qty 2

## 2017-05-17 MED ORDER — PREDNISONE 5 MG PO TABS
5.0000 mg | ORAL_TABLET | Freq: Every day | ORAL | Status: DC
  Administered 2017-05-18 – 2017-05-19 (×2): 5 mg via ORAL
  Filled 2017-05-17 (×2): qty 1

## 2017-05-17 MED ORDER — MELATONIN 3 MG PO TABS
3.0000 mg | ORAL_TABLET | Freq: Every day | ORAL | Status: DC
  Administered 2017-05-17 – 2017-05-19 (×2): 3 mg via ORAL
  Filled 2017-05-17 (×2): qty 1

## 2017-05-17 MED ORDER — PHENOL 1.4 % MT LIQD: 1.0000 | OROMUCOSAL | Status: DC | PRN

## 2017-05-17 MED ORDER — METOPROLOL TARTRATE 12.5 MG HALF TABLET
ORAL_TABLET | ORAL | Status: AC
  Administered 2017-05-17: 25 mg via ORAL
  Filled 2017-05-17: qty 2

## 2017-05-17 MED ORDER — MIDAZOLAM HCL 2 MG/2ML IJ SOLN
1.0000 mg | INTRAMUSCULAR | Status: DC | PRN
  Administered 2017-05-17: 1 mg via INTRAVENOUS

## 2017-05-17 MED ORDER — GUAIFENESIN-DM 100-10 MG/5ML PO SYRP: 15.0000 mL | ORAL_SOLUTION | ORAL | Status: DC | PRN

## 2017-05-17 MED ORDER — MORPHINE SULFATE (PF) 4 MG/ML IV SOLN
1.0000 mg | INTRAVENOUS | Status: DC | PRN
  Administered 2017-05-17 – 2017-05-18 (×4): 2 mg via INTRAVENOUS
  Filled 2017-05-17 (×5): qty 1

## 2017-05-17 MED ORDER — PROPOFOL 10 MG/ML IV BOLUS
INTRAVENOUS | Status: DC | PRN
  Administered 2017-05-17: 30 mg via INTRAVENOUS

## 2017-05-17 MED ORDER — METOPROLOL TARTRATE 5 MG/5ML IV SOLN: 2.0000 mg | INTRAVENOUS | Status: DC | PRN

## 2017-05-17 MED ORDER — PROPOFOL 500 MG/50ML IV EMUL
INTRAVENOUS | Status: DC | PRN
  Administered 2017-05-17: 40 ug/kg/min via INTRAVENOUS

## 2017-05-17 MED ORDER — ONDANSETRON HCL 4 MG/2ML IJ SOLN: 4.0000 mg | Freq: Once | INTRAMUSCULAR | Status: DC | PRN

## 2017-05-17 MED ORDER — MORPHINE SULFATE (PF) 2 MG/ML IV SOLN: 1.0000 mg | INTRAVENOUS | Status: DC | PRN

## 2017-05-17 MED ORDER — AMLODIPINE BESYLATE 5 MG PO TABS
5.0000 mg | ORAL_TABLET | Freq: Every day | ORAL | Status: DC
  Filled 2017-05-17: qty 1

## 2017-05-17 MED ORDER — LACTATED RINGERS IV SOLN
INTRAVENOUS | Status: DC
  Administered 2017-05-17: 13:00:00 via INTRAVENOUS

## 2017-05-17 MED ORDER — 0.9 % SODIUM CHLORIDE (POUR BTL) OPTIME
TOPICAL | Status: DC | PRN
  Administered 2017-05-17: 1000 mL

## 2017-05-17 MED ORDER — SENNOSIDES-DOCUSATE SODIUM 8.6-50 MG PO TABS
1.0000 | ORAL_TABLET | Freq: Every day | ORAL | Status: DC
  Filled 2017-05-17 (×2): qty 1

## 2017-05-17 MED ORDER — DULOXETINE HCL 60 MG PO CPEP
60.0000 mg | ORAL_CAPSULE | Freq: Every day | ORAL | Status: DC
  Administered 2017-05-18 – 2017-05-19 (×2): 60 mg via ORAL
  Filled 2017-05-17 (×2): qty 1

## 2017-05-17 MED ORDER — SODIUM CHLORIDE 0.9% FLUSH: 3.0000 mL | INTRAVENOUS | Status: DC | PRN

## 2017-05-17 MED ORDER — STERILE WATER FOR IRRIGATION IR SOLN
Status: DC | PRN
  Administered 2017-05-17: 1000 mL

## 2017-05-17 MED ORDER — DEXTROSE 5 % IV SOLN
1.5000 g | INTRAVENOUS | Status: AC
  Administered 2017-05-17: 1.5 g via INTRAVENOUS
  Filled 2017-05-17: qty 1.5

## 2017-05-17 MED ORDER — PRO-STAT SUGAR FREE PO LIQD
30.0000 mL | Freq: Two times a day (BID) | ORAL | Status: DC
  Administered 2017-05-17 – 2017-05-19 (×4): 30 mL via ORAL
  Filled 2017-05-17 (×4): qty 30

## 2017-05-17 MED ORDER — BUPIVACAINE-EPINEPHRINE (PF) 0.5% -1:200000 IJ SOLN
INTRAMUSCULAR | Status: DC | PRN
  Administered 2017-05-17: 30 mL via PERINEURAL

## 2017-05-17 MED ORDER — OXYCODONE HCL 5 MG PO TABS: 2.5000 mg | ORAL_TABLET | Freq: Three times a day (TID) | ORAL | 0 refills | Status: DC

## 2017-05-17 MED ORDER — METOPROLOL TARTRATE 25 MG PO TABS
25.0000 mg | ORAL_TABLET | Freq: Once | ORAL | Status: AC
  Administered 2017-05-17: 25 mg via ORAL

## 2017-05-17 MED ORDER — ASPIRIN EC 81 MG PO TBEC
81.0000 mg | DELAYED_RELEASE_TABLET | Freq: Every day | ORAL | Status: DC
  Administered 2017-05-18 – 2017-05-19 (×2): 81 mg via ORAL
  Filled 2017-05-17 (×2): qty 1

## 2017-05-17 MED ORDER — PROPOFOL 10 MG/ML IV BOLUS
INTRAVENOUS | Status: AC
  Filled 2017-05-17: qty 20

## 2017-05-17 MED ORDER — LIDOCAINE HCL (PF) 1 % IJ SOLN
INTRAMUSCULAR | Status: AC
  Filled 2017-05-17: qty 30

## 2017-05-17 MED ORDER — POTASSIUM CHLORIDE CRYS ER 20 MEQ PO TBCR: 20.0000 meq | EXTENDED_RELEASE_TABLET | Freq: Once | ORAL | Status: DC

## 2017-05-17 MED ORDER — RISPERIDONE 0.5 MG PO TABS
0.5000 mg | ORAL_TABLET | Freq: Every day | ORAL | Status: DC
  Administered 2017-05-17 – 2017-05-19 (×2): 0.5 mg via ORAL
  Filled 2017-05-17 (×2): qty 1

## 2017-05-17 MED ORDER — METOPROLOL TARTRATE 25 MG PO TABS
25.0000 mg | ORAL_TABLET | Freq: Three times a day (TID) | ORAL | Status: DC
  Administered 2017-05-17 – 2017-05-19 (×5): 25 mg via ORAL
  Filled 2017-05-17 (×6): qty 1

## 2017-05-17 MED ORDER — PANTOPRAZOLE SODIUM 40 MG PO TBEC
40.0000 mg | DELAYED_RELEASE_TABLET | Freq: Every day | ORAL | Status: DC
  Administered 2017-05-18 – 2017-05-19 (×2): 40 mg via ORAL
  Filled 2017-05-17 (×2): qty 1

## 2017-05-17 MED ORDER — ACETAMINOPHEN 325 MG PO TABS
325.0000 mg | ORAL_TABLET | ORAL | Status: DC | PRN
Start: 2017-05-17 — End: 2017-05-19
  Administered 2017-05-17 – 2017-05-18 (×3): 650 mg via ORAL
  Filled 2017-05-17 (×3): qty 2

## 2017-05-17 SURGICAL SUPPLY — 36 items
BANDAGE ACE 4X5 VEL STRL LF (GAUZE/BANDAGES/DRESSINGS) ×2 IMPLANT
BNDG GAUZE ELAST 4 BULKY (GAUZE/BANDAGES/DRESSINGS) ×2 IMPLANT
CANISTER SUCT 3000ML PPV (MISCELLANEOUS) ×2 IMPLANT
CLIP TI MEDIUM 6 (CLIP) ×1 IMPLANT
COVER SURGICAL LIGHT HANDLE (MISCELLANEOUS) ×2 IMPLANT
DRAPE EXTREMITY T 121X128X90 (DRAPE) ×2 IMPLANT
DRAPE HALF SHEET 40X57 (DRAPES) ×2 IMPLANT
DRSG COVADERM 4X6 (GAUZE/BANDAGES/DRESSINGS) ×1 IMPLANT
DRSG EMULSION OIL 3X3 NADH (GAUZE/BANDAGES/DRESSINGS) ×2 IMPLANT
ELECT REM PT RETURN 9FT ADLT (ELECTROSURGICAL) ×2
ELECTRODE REM PT RTRN 9FT ADLT (ELECTROSURGICAL) ×1 IMPLANT
GAUZE SPONGE 4X4 12PLY STRL (GAUZE/BANDAGES/DRESSINGS) ×2 IMPLANT
GLOVE BIO SURGEON STRL SZ7 (GLOVE) ×1 IMPLANT
GLOVE BIO SURGEON STRL SZ7.5 (GLOVE) ×3 IMPLANT
GLOVE BIOGEL PI IND STRL 6.5 (GLOVE) IMPLANT
GLOVE BIOGEL PI INDICATOR 6.5 (GLOVE) ×2
GOWN STRL REUS W/ TWL LRG LVL3 (GOWN DISPOSABLE) ×3 IMPLANT
GOWN STRL REUS W/TWL LRG LVL3 (GOWN DISPOSABLE) ×4
KIT BASIN OR (CUSTOM PROCEDURE TRAY) ×2 IMPLANT
KIT ROOM TURNOVER OR (KITS) ×2 IMPLANT
NS IRRIG 1000ML POUR BTL (IV SOLUTION) ×2 IMPLANT
PACK GENERAL/GYN (CUSTOM PROCEDURE TRAY) ×2 IMPLANT
PAD ARMBOARD 7.5X6 YLW CONV (MISCELLANEOUS) ×6 IMPLANT
SPECIMEN JAR SMALL (MISCELLANEOUS) ×1 IMPLANT
SUT ETHILON 3 0 PS 1 (SUTURE) ×4 IMPLANT
SUT SILK 2 0 (SUTURE) ×2
SUT SILK 2-0 18XBRD TIE 12 (SUTURE) IMPLANT
SUT VIC AB 3-0 SH 27 (SUTURE) ×2
SUT VIC AB 3-0 SH 27X BRD (SUTURE) IMPLANT
SWAB COLLECTION DEVICE MRSA (MISCELLANEOUS) IMPLANT
SWAB CULTURE ESWAB REG 1ML (MISCELLANEOUS) IMPLANT
TOWEL GREEN STERILE (TOWEL DISPOSABLE) ×1 IMPLANT
TOWEL OR 17X24 6PK STRL BLUE (TOWEL DISPOSABLE) ×1 IMPLANT
TOWEL OR 17X26 10 PK STRL BLUE (TOWEL DISPOSABLE) ×1 IMPLANT
UNDERPAD 30X30 (UNDERPADS AND DIAPERS) ×2 IMPLANT
WATER STERILE IRR 1000ML POUR (IV SOLUTION) ×2 IMPLANT

## 2017-05-17 NOTE — H&P (View-Only) (Signed)
  Referring Physician: Joel Blass, MD  Patient name: Allison Beasley MRN: [id number] DOB: 10/25/1960 Sex: female  REASON FOR CONSULT: Osteomyelitis left fifth toe  HPI: Allison Beasley is a 55 y.o. female well-known to me from multiple prior nonhealing wounds of her feet secondary to autoimmune disease/polymyositis/diabetes. The patient underwent amputation of her left first toe in 2014. She had amputation of her right first toe in 2016. Her underlying disease process has progressed to the point where she has significant calcification subcutaneously in her arms and back. She is essentially wheelchair-bound at this point. She is currently on prednisone and tacrolimus for immunosuppression. She developed a wound on the lateral aspect of her left foot which has now had continuous clear drainage. She denies any fever or chills. She had an MRI scan done of the left foot a few weeks ago which shows osteomyelitis in the left fifth metatarsal head. She has had multiple prior arteriograms which showed no evidence of large vessel occlusive disease but obliteration of digital vessels. Other medical problems include hypertension, arthritis, fibromyalgia, depression, anxiety, anemia all of which have been stable.  Past Medical History:  Diagnosis Date  . Anemia   . Anxiety   . Constipation   . Depression   . Fibromyalgia   . GERD (gastroesophageal reflux disease)   . Headache(784.0)   . Hypertension   . Osteoarthritis   . Osteopenia   . Peripheral vascular disease (HCC)   . Pneumonia 03/2012  . Polymyositis (HCC)   . Polymyositis (HCC)   . Polymyositis (HCC)   . Raynaud phenomenon   . Rheumatoid arthritis (HCC)   . Seizures (HCC)    with chid birth 0f 2 childre  . Shortness of breath    with exertion  . Type II diabetes mellitus (HCC)    on no medications   Past Surgical History:  Procedure Laterality Date  . ABDOMINAL AORTAGRAM N/A 07/25/2014   Procedure: ABDOMINAL AORTAGRAM;  Surgeon: Charles E  Fields, MD;  Location: [facility] CATH LAB;  Service: Cardiovascular;  Laterality: N/A;  . AMPUTATION Left 12/24/2012   Procedure: AMPUTATION DIGIT left great toe;  Surgeon: Charles E Fields, MD;  Location: [facility] OR;  Service: Vascular;  Laterality: Left;  GREAT  . AMPUTATION Right 05/19/2015   Procedure: AMPUTATION DIGIT;  Surgeon: Charles E Fields, MD;  Location: [facility] OR;  Service: Vascular;  Laterality: Right;  . AMPUTATION Right 07/22/2015   Procedure: RAY AMPUTATION OF RIGHT GREAT TOE AMPUTATION SITE;  Surgeon: Charles E Fields, MD;  Location: [facility] OR;  Service: Vascular;  Laterality: Right;  . APPLICATION OF A-CELL OF EXTREMITY Right 11/12/2015   Procedure: INTEGRA;  Surgeon: Claire S Dillingham, DO;  Location: [facility] OR;  Service: Plastics;  Laterality: Right;  . CESAREAN SECTION     x3  . COLONOSCOPY    . HYSTEROSCOPY     w/ D&C with excision of endometrial polyps  . I&D EXTREMITY Right 10/26/2015   Procedure: IRRIGATION AND DEBRIDEMENT EXTREMITY/RIGHT FOOT;  Surgeon: Brian L Chen, MD;  Location: [facility] OR;  Service: Vascular;  Laterality: Right;  . I&D EXTREMITY Right 10/29/2015   Procedure: IRRIGATION AND DEBRIDEMENT RIGHT FOOT WITH ACELL AND VAC PLACEMENT;  Surgeon: Claire S Dillingham, DO;  Location: [facility] OR;  Service: Plastics;  Laterality: Right;  . I&D EXTREMITY Right 11/12/2015   Procedure: IRRIGATION AND DEBRIDEMENT RIGHT EXTREMITY;  Surgeon: Claire S Dillingham, DO;  Location: [facility] OR;  Service: Plastics;  Laterality: Right;  . IRRIGATION AND DEBRIDEMENT   FOOT Right 10/26/2015  . left great toenail removal  Left 2013   done by [facility]  . MINOR APPLICATION OF WOUND VAC Right 11/12/2015   Procedure: MINOR APPLICATION OF WOUND VAC;  Surgeon: Alena Bills Dillingham, DO;  Location: [facility] OR;  Service: Plastics;  Laterality: Right;  . PERIPHERAL VASCULAR CATHETERIZATION N/A 05/15/2015   Procedure: Abdominal Aortogram;  Surgeon: Sherren Kerns, MD;  Location: [facility] INVASIVE CV LAB;  Service: Cardiovascular;   Laterality: N/A;  . SKIN SPLIT GRAFT Right 12/02/2015   Procedure: SKIN GRAFT SPLIT THICKNESS RIGHT FOOT WITH PLACEMENT VAC DRESSING;  Surgeon: Alena Bills Dillingham, DO;  Location: [facility] OR;  Service: Plastics;  Laterality: Right;  . TONSILLECTOMY Bilateral   . TUBAL LIGATION      Family History  Problem Relation Age of Onset  . Diabetes Mother   . Hypertension Mother   . Diabetes Father   . Heart disease Father   . Hypertension Sister   . Heart disease Sister     SOCIAL HISTORY: Social History   Social History  . Marital status: Married    Spouse name: N/A  . Number of children: N/A  . Years of education: N/A   Occupational History  . Not on file.   Social History Main Topics  . Smoking status: Never Smoker  . Smokeless tobacco: Never Used  . Alcohol use No     Comment: 05/19/2015 "might have a couple drinks/yr"  . Drug use: No  . Sexual activity: Not on file   Other Topics Concern  . Not on file   Social History Narrative  . No narrative on file    Allergies  Allergen Reactions  . Clindamycin/Lincomycin Itching and Rash    Possible reaction (took clindamycin and cipro at the same time)  . Doxycycline Nausea Only  . Percocet [Oxycodone-Acetaminophen] Itching  . Ciprofloxacin Itching and Rash    Possible reaction (took clindamycin and cipro at the same time)   . Mycophenolate Diarrhea    Current Outpatient Prescriptions  Medication Sig Dispense Refill  . Amino Acids-Protein Hydrolys (FEEDING SUPPLEMENT, PRO-STAT SUGAR FREE 64,) LIQD Take 30 mLs by mouth 2 (two) times daily.    Marland Kitchen amLODipine (NORVASC) 5 MG tablet Take 5 mg by mouth daily.    . Cholecalciferol 1000 units tablet Take 1,000 Units by mouth daily.    . clonazePAM (KLONOPIN) 0.5 MG tablet Take 0.5 mg by mouth every 8 (eight) hours as needed for anxiety. Stop date 03/14/17    . DULoxetine (CYMBALTA) 20 MG capsule Take 60 mg by mouth daily.     . famotidine (PEPCID) 20 MG tablet Take 20 mg by mouth 2  (two) times daily.    . heparin 5,000 Units in sodium chloride 0.9 % 1,000 mL Inject 1 mL as directed every 8 (eight) hours.    . Melatonin 3 MG TABS Take 3 mg by mouth at bedtime.    . metoprolol tartrate (LOPRESSOR) 25 MG tablet Take 25 mg by mouth every 8 (eight) hours.    . Multiple Vitamin (MULTIVITAMIN) tablet Take 1 tablet by mouth daily.    . nitrofurantoin (MACRODANTIN) 100 MG capsule Take 100 mg by mouth 2 (two) times daily.    . Omega-3 Fatty Acids (FISH OIL) 1000 MG CAPS Take 1 capsule by mouth daily.    Marland Kitchen oxycodone (OXY-IR) 5 MG capsule Take 2.5 mg by mouth 3 (three) times daily.    . predniSONE (DELTASONE) 5 MG tablet Take 5 mg  by mouth daily with breakfast.    . Psyllium (REGULOID PO) Take by mouth. Give 1 tablespoon by mouth 2 times daily    . risperiDONE (RISPERDAL) 0.25 MG tablet Take 0.5 mg by mouth at bedtime.     . sennosides-docusate sodium (SENOKOT-S) 8.6-50 MG tablet Take 1 tablet by mouth at bedtime.    . Skin Protectants, Misc. (EUCERIN) cream Apply 1 application topically daily.    . tacrolimus (PROGRAF) 1 MG capsule Take 2 mg by mouth 2 (two) times daily.    Marland Kitchen UNABLE TO FIND Med Name: Med pass 120 mL by mouth 2 times daily     No current facility-administered medications for this visit.     ROS:   General:  No weight loss, Fever, chills  HEENT: No recent headaches, no nasal bleeding, no visual changes, no sore throat  Neurologic: No dizziness, blackouts, seizures. No recent symptoms of stroke or mini- stroke. No recent episodes of slurred speech, or temporary blindness.  Cardiac: No recent episodes of chest pain/pressure, no shortness of breath at rest.  + shortness of breath with exertion.  Denies history of atrial fibrillation or irregular heartbeat  Vascular: No history of rest pain in feet.  No history of claudication.  + history of non-healing ulcer, No history of DVT   Pulmonary: No home oxygen, no productive cough, no hemoptysis,  No asthma or  wheezing  Musculoskeletal:  [X]  Arthritis, [X]  Low back pain,  [X]  Joint pain  Hematologic:No history of hypercoagulable state.  No history of easy bleeding.  +history of anemia  Gastrointestinal: No hematochezia or melena,  No gastroesophageal reflux, no trouble swallowing  Urinary: [ ]  chronic Kidney disease, [ ]  on HD - [ ]  MWF or [ ]  TTHS, [ ]  Burning with urination, [ ]  Frequent urination, [ ]  Difficulty urinating;   Skin: No rashes  Psychological: No history of anxiety,  No history of depression   Physical Examination  Vitals:   05/11/17 1211  BP: 110/75  Pulse: 89  Resp: 16  Temp: (!) 97.3 F (36.3 C)  TempSrc: Oral  Weight: 115 lb (52.2 kg)  Height: 5\' 1"  (1.549 m)    Body mass index is 21.73 kg/m.  General:  Alert and oriented, no acute distress HEENT: Normal Neck: No bruit or JVD Pulmonary: Clear to auscultation bilaterally Cardiac: Regular Rate and Rhythm without murmur Abdomen: Soft, non-tender, non-distended, no mass Skin: No rash, open ulceration lateral aspect left fifth metatarsal with no significant surrounding erythema Extremity Pulses:  2+ radial, brachial, femoral, absent dorsalis pedis, posterior tibial pulses bilaterally Musculoskeletal: No  edema  Neurologic: Upper and lower extremity motor 5/5 and symmetric  DATA:  Patient had a left lower extremity arterial duplex exam today which showed patent left lower extremity with no focal stenosis calcified vessels triphasic Doppler flow except for in the distal arterial tree which was monophasic consistent with her prior arteriogram  ASSESSMENT:  Patient with osteomyelitis left fifth metatarsal head. She will need amputation of her left fifth digit with resection of the metatarsal head. She should have adequate perfusion to heal this. However she is on chronic immunosuppression and this may make wound healing difficulties. She would be possibly at risk for below-knee amputation if this does not  heal.   PLAN:  Risks benefits possible palpitations and procedure details of left fifth toe amputation with resection of metatarsal head discussed with the patient today. She understands and agrees to proceed. This is scheduled for 05/17/2017.  Ruta Hinds, MD [facility] [place] Office: [phone] Pager: [phone]

## 2017-05-17 NOTE — Op Note (Signed)
Procedure: Amputation of left fifth toe with resection of metatarsal head  Preoperative diagnosis: Osteomyelitis left fifth metatarsal head  Postoperative diagnosis: Same  Anesthesia: Regional block with sedation  Operative findings: Reasonable perfusion of surrounding tissues two thirds of left fifth Metatarsal resected  Operative details: After obtaining informed consent, the patient was taken down from. A block of been previously placed by the anesthesia team. The left foot was prepped and draped in usual sterile fashion. Next a racquet-shaped incision was done incorporating the left fifth toe and to the mid shaft of the left fifth metatarsal. The incision was made in this location carried down to the saphenous tissues down level of bone. The bone was freed up with a periosteal elevator. It was then transected. The distal portion of the metatarsal was quite friable and of poor quality suggestive of osteomyelitis. Greggory Stallion are used to debride the metatarsal back to good quality bone. This was probably two thirds of the left this metatarsal. The tendons were pulled down the operative field transected and allowed to retract in the foot. Hemostasis was obtained. The wound was thoroughly irrigated with normal saline solution. Skin and subcutaneous tissues were then connected back together with interrupted vertical mattress and simple nylon sutures. The patient are the procedure well and there were no complications. Instrument sponge and needle count was correct in the case. The patient was taken to recovery in stable condition.  Fabienne Bruns, MD [facility] [place] Office: [phone] Pager: [phone]

## 2017-05-17 NOTE — Discharge Summary (Signed)
Discharge Summary    Allison Beasley Aug 20, 1961 56 y.o. female  [id number]  Admission Date: 05/17/2017  Discharge Date: 05/19/2017  Physician: Sherren Kerns, MD  Admission Diagnosis: raynaud's with gangrene   HPI:   This is a 56 y.o. female well-known to me from multiple prior nonhealing wounds of her feet secondary to autoimmune disease/polymyositis/diabetes. The patient underwent amputation of her left first toe in 2014. She had amputation of her right first toe in 2016. Her underlying disease process has progressed to the point where she has significant calcification subcutaneously in her arms and back. She is essentially wheelchair-bound at this point. She is currently on prednisone and tacrolimus for immunosuppression. She developed a wound on the lateral aspect of her left foot which has now had continuous clear drainage. She denies any fever or chills. She had an MRI scan done of the left foot a few weeks ago which shows osteomyelitis in the left fifth metatarsal head. She has had multiple prior arteriograms which showed no evidence of large vessel occlusive disease but obliteration of digital vessels. Other medical problems include hypertension, arthritis, fibromyalgia, depression, anxiety, anemia all of which have been stable.  Hospital Course:  The patient was admitted to the hospital and taken to the operating room on 05/17/2017 and underwent: Amputation of left fifth toe with resection of metatarsal head    Intraoperative findings as follows: Reasonable perfusion of surrounding tissues two thirds of left fifth Metatarsal resected  The pt tolerated the procedure well and was transported to the PACU in good condition.   By POD 1, she was still having pain with her amputation site. Wound care was consulted for multiple wounds to bilat ischium, flanks, buttocks. She stayed an additional night for pain control. By POD 2, her incision site was healing well and pain well  controlled on po pain meds. She was discharged to SNF in good condition.   CBC    Component Value Date/Time   WBC 11.5 (H) 05/17/2017 0844   RBC 3.88 05/17/2017 0844   HGB 10.4 (L) 05/17/2017 0844   HCT 34.4 (L) 05/17/2017 0844   PLT 366 05/17/2017 0844   MCV 88.7 05/17/2017 0844   MCH 26.8 05/17/2017 0844   MCHC 30.2 05/17/2017 0844   RDW 17.9 (H) 05/17/2017 0844   LYMPHSABS 1.7 10/24/2015 1153   MONOABS 0.3 10/24/2015 1153   EOSABS 0.2 10/24/2015 1153   BASOSABS 0.0 10/24/2015 1153    BMET    Component Value Date/Time   NA 141 05/17/2017 0844   NA 143 03/13/2017   K 3.8 05/17/2017 0844   CL 109 05/17/2017 0844   CO2 23 05/17/2017 0844   GLUCOSE 108 (H) 05/17/2017 0844   BUN 15 05/17/2017 0844   BUN 8 03/13/2017   CREATININE 0.55 05/17/2017 0844   CALCIUM 9.0 05/17/2017 0844   GFRNONAA >60 05/17/2017 0844   GFRAA >60 05/17/2017 0844      Discharge Instructions    Call MD for:  redness, tenderness, or signs of infection (pain, swelling, bleeding, redness, odor or green/yellow discharge around incision site)    Complete by:  As directed    Call MD for:  severe or increased pain, loss or decreased feeling  in affected limb(s)    Complete by:  As directed    Call MD for:  temperature >100.5    Complete by:  As directed    Discharge wound care:    Complete by:  As directed    Wash  wound daily and dress with dry dressing daily.   Resume previous diet    Complete by:  As directed       Discharge Diagnosis:  raynaud's with gangrene  Secondary Diagnosis: Patient Active Problem List   Diagnosis Date Noted  . PAD (peripheral artery disease) (HCC) 05/17/2017  . Abscess of right foot 10/27/2015  . Ulcer of foot (HCC) 08/19/2015  . Infection 07/21/2015  . Toe infection 05/19/2015  . Pain of great toe 05/07/2015  . Peripheral vascular disease, unspecified (HCC) 04/24/2014  . Pain of right lower extremity 04/10/2014  . Sensation of cold in leg- Right Leg and foot  04/10/2014  . Amputated great toe (HCC) 04/24/2013  . Open wound of great toe 03/21/2013  . Aftercare following surgery of the circulatory system, NEC 02/14/2013  . 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 Atherosclerosis of native arteries of the extremities with gangrene (HCC) 01/24/2013  . Dyspnea 01/13/2013  . Elevated troponin borderline -- no true elevation, resolved. 01/13/2013  . Raynaud's disease 01/13/2013  . Polymyositis (HCC) 01/13/2013  . Fibromyalgia 01/13/2013  . Anemia, iron deficiency 10/25/2012  . Acid reflux 07/30/2012  . Interstitial lung disease (HCC) 07/30/2012  . PNA (pneumonia) 04/09/2012  . Polymyositis associated with autoimmune disease (HCC) 08/15/2011   Past Medical History:  Diagnosis Date  . Anemia   . Anxiety   . Constipation   . Depression   . Fibromyalgia   . GERD (gastroesophageal reflux disease)   . H. influenzae infection 2018  . Headache(784.0)   . Hypertension   . Interstitial pulmonary disease, unspecified (HCC) 2018  . Open wound of left buttock 56/2018  . Osteoarthritis   . Osteopenia   . Peripheral vascular disease (HCC)   . Pneumonia 03/2012  . Polymyositis (HCC)   . Polymyositis (HCC)   . Polymyositis (HCC)   . Polymyositis (HCC)   . Pressure ulcer of buttock 02/2017   right Buttock-   . Pressure ulcer- Left Heel 2018   unstageable   . Raynaud phenomenon   . Rheumatoid arthritis (HCC)   . Seizures (HCC)    with chid birth 6f 2 childre  . Sepsis (HCC) 2018  . Shortness of breath    with exertion  . Type II diabetes mellitus (HCC)    on no medications     Allergies as of 05/17/2017      Reactions   Clindamycin/lincomycin Itching, Rash   Possible reaction (took clindamycin and cipro at the same time)   Doxycycline Nausea Only   Percocet [oxycodone-acetaminophen] Itching   Ciprofloxacin Itching, Rash   Possible reaction (took clindamycin and cipro at the same time)    Mycophenolate Diarrhea      Medication List    TAKE these medications   amLODipine 5 MG tablet Commonly known as:  NORVASC Take 5 mg by mouth daily. (0900)   aspirin EC 81 MG tablet Take 81 mg by mouth daily. (0900)   Cholecalciferol 1000 units tablet Take 1,000 Units by mouth daily. (0900)   DULoxetine 60 MG capsule Commonly known as:  CYMBALTA Take 60 mg by mouth daily. (0900)   eucerin cream Apply 1 application topically daily. Apply to whole body   famotidine 20 MG tablet Commonly known as:  PEPCID Take 20 mg by mouth 2 (two) times daily.   feeding supplement (PRO-STAT SUGAR FREE 64) Liqd Take 30 mLs by mouth 2 (two) times daily.   Fish Oil 1000 MG Caps  Take 1,000 mg by mouth daily. (0900)   Lavender Oil Oil Apply 1 application topically every hour as needed (apply 1 drop behind each ear as needed for agitation/anxiety/insomnia or restlessness).   Melatonin 3 MG Tabs Take 3 mg by mouth at bedtime.   metoprolol tartrate 25 MG tablet Commonly known as:  LOPRESSOR Take 25 mg by mouth every 8 (eight) hours.   multivitamin tablet Take 1 tablet by mouth daily. (0900)   oxyCODONE 5 MG immediate release tablet Commonly known as:  Oxy IR/ROXICODONE Take 0.5 tablets (2.5 mg total) by mouth 3 (three) times daily.   predniSONE 5 MG tablet Commonly known as:  DELTASONE Take 5 mg by mouth daily with breakfast.   REGULOID PO Take 15 mLs by mouth 2 (two) times daily. Give 1 tablespoon by mouth 2 times daily   risperiDONE 0.5 MG tablet Commonly known as:  RISPERDAL Take 0.5 mg by mouth at bedtime. (2100)   sennosides-docusate sodium 8.6-50 MG tablet Commonly known as:  SENOKOT-S Take 1 tablet by mouth at bedtime.   tacrolimus 1 MG capsule Commonly known as:  PROGRAF Take 2 mg by mouth 2 (two) times daily.   UNABLE TO FIND Med Name: Med pass 120 mL by mouth 2 times daily       Prescriptions given: Roxicet #20 No Refill  Instructions: 1. Wash left foot  wound daily with soap and water and pat dry. Apply dry dressing daily. 2. Avoid applying direct weight on left foot. Weightbearing on right side.  3. Sanytl to left lower and upper ischium wounds and right flank wound daily. Cover with moist 2x2 and foam dressings. Change foam dressings every 3 days or prn soiling.   Disposition: SNF  Patient's condition: is Good  Follow up: 1. Dr. Darrick Penna in 2-3 weeks   Maris Berger, PA-C [place] [place] [facility] [phone] 05/17/2017  3:42 PM

## 2017-05-17 NOTE — Anesthesia Preprocedure Evaluation (Addendum)
Anesthesia Evaluation  Patient identified by MRN, date of birth, ID band Patient awake    Reviewed: Allergy & Precautions, H&P , NPO status , Patient's Chart, lab work & pertinent test results, reviewed documented beta blocker date and time   Airway Mallampati: III  TM Distance: >3 FB Neck ROM: Full    Dental no notable dental hx. (+) Dental Advisory Given, Missing, Chipped,    Pulmonary neg pulmonary ROS,    Pulmonary exam normal breath sounds clear to auscultation       Cardiovascular hypertension, Pt. on medications and Pt. on home beta blockers + Peripheral Vascular Disease   Rhythm:Regular Rate:Normal     Neuro/Psych  Headaches, Seizures -, Well Controlled,  PSYCHIATRIC DISORDERS Anxiety Depression  Neuromuscular disease (polymyositis)    GI/Hepatic Neg liver ROS, GERD  Medicated and Controlled,  Endo/Other  diabetes, Type 2, Oral Hypoglycemic Agents  Renal/GU negative Renal ROS  negative genitourinary   Musculoskeletal  (+) Arthritis , Osteoarthritis, Rheumatoid disorders and steroids,  Fibromyalgia -  Abdominal   Peds  Hematology negative hematology ROS (+) anemia ,   Anesthesia Other Findings   Reproductive/Obstetrics negative OB ROS                            Anesthesia Physical  Anesthesia Plan  ASA: III  Anesthesia Plan: Regional   Post-op Pain Management:    Induction:   PONV Risk Score and Plan: 2 and Ondansetron and Dexamethasone  Airway Management Planned: Simple Face Mask  Additional Equipment:   Intra-op Plan:   Post-operative Plan:   Informed Consent: I have reviewed the patients History and Physical, chart, labs and discussed the procedure including the risks, benefits and alternatives for the proposed anesthesia with the patient or authorized representative who has indicated his/her understanding and acceptance.   Dental advisory given  Plan Discussed  with: CRNA  Anesthesia Plan Comments:        Anesthesia Quick Evaluation

## 2017-05-17 NOTE — Transfer of Care (Signed)
Immediate Anesthesia Transfer of Care Note  Patient: Allison Beasley  Procedure(s) Performed: Procedure(s): AMPUTATION LEFT FIFTH TOE (Left)  Patient Location: PACU  Anesthesia Type:MAC and Regional  Level of Consciousness: awake, alert , oriented and patient cooperative  Airway & Oxygen Therapy: Patient Spontanous Breathing and Patient connected to nasal cannula oxygen  Post-op Assessment: Report given to RN and Post -op Vital signs reviewed and stable  Post vital signs: Reviewed and stable  Last Vitals:  Vitals:   05/17/17 1545 05/17/17 1600  BP: 103/74 112/72  Pulse: 63 63  Resp: 13 14  Temp:      Last Pain:  Vitals:   05/17/17 1445  TempSrc:   PainSc: 0-No pain      Patients Stated Pain Goal: 6 (41/66/06 3016)  Complications: No apparent anesthesia complications

## 2017-05-17 NOTE — Anesthesia Procedure Notes (Addendum)
Anesthesia Regional Block: Popliteal block   Pre-Anesthetic Checklist: ,, timeout performed, Correct Patient, Correct Site, Correct Laterality, Correct Procedure, Correct Position, site marked, Risks and benefits discussed,  Surgical consent,  Pre-op evaluation,  At surgeon's request and post-op pain management  Laterality: Left  Prep: chloraprep       Needles:  Injection technique: Single-shot  Needle Type: Echogenic Needle     Needle Length: 9cm  Needle Gauge: 21     Additional Needles:   Procedures: ultrasound guided,,,,,,,,  Narrative:  Start time: 05/17/2017 10:04 AM End time: 05/17/2017 10:09 AM Injection made incrementally with aspirations every 5 mL.  Performed by: Personally  Anesthesiologist: Cecile Hearing  Additional Notes: No pain on injection. No increased resistance to injection. Injection made in 5cc increments.  Good needle visualization.  Patient tolerated procedure well.  LEFT POPLITEAL NERVE BLOCK

## 2017-05-17 NOTE — Interval H&P Note (Signed)
History and Physical Interval Note:  05/17/2017 11:21 AM  Allison Beasley  has presented today for surgery, with the diagnosis of raynaud's with gangrene  The various methods of treatment have been discussed with the patient and family. After consideration of risks, benefits and other options for treatment, the patient has consented to  Procedure(s): AMPUTATION LEFT FIFTH TOE (Left) as a surgical intervention .  The patient's history has been reviewed, patient examined, no change in status, stable for surgery.  I have reviewed the patient's chart and labs.  Questions were answered to the patient's satisfaction.     Fabienne Bruns

## 2017-05-18 ENCOUNTER — Telehealth: Payer: Self-pay | Admitting: Vascular Surgery

## 2017-05-18 ENCOUNTER — Encounter (HOSPITAL_COMMUNITY): Payer: Self-pay | Admitting: Vascular Surgery

## 2017-05-18 DIAGNOSIS — E1169 Type 2 diabetes mellitus with other specified complication: Secondary | ICD-10-CM | POA: Diagnosis not present

## 2017-05-18 LAB — HIV ANTIBODY (ROUTINE TESTING W REFLEX): HIV Screen 4th Generation wRfx: NONREACTIVE

## 2017-05-18 MED ORDER — COLLAGENASE 250 UNIT/GM EX OINT
TOPICAL_OINTMENT | Freq: Every day | CUTANEOUS | Status: DC
  Administered 2017-05-19: 09:00:00 via TOPICAL
  Filled 2017-05-18: qty 30

## 2017-05-18 MED ORDER — OXYCODONE HCL 5 MG PO TABS: 5.0000 mg | ORAL_TABLET | Freq: Four times a day (QID) | ORAL | Status: DC | PRN

## 2017-05-18 MED ORDER — ENSURE ENLIVE PO LIQD: 237.0000 mL | Freq: Every day | ORAL | Status: DC

## 2017-05-18 MED ORDER — OXYCODONE HCL 5 MG PO TABS
5.0000 mg | ORAL_TABLET | Freq: Four times a day (QID) | ORAL | Status: DC | PRN
  Administered 2017-05-18 – 2017-05-19 (×5): 5 mg via ORAL
  Filled 2017-05-18 (×5): qty 1

## 2017-05-18 NOTE — Progress Notes (Signed)
Initial Nutrition Assessment  DOCUMENTATION CODES:   Not applicable  INTERVENTION:  Continue 30 ml Prostat po BID, each supplement provides 100 kcal and 15 grams of protein.   Provide Ensure Enlive po once daily, each supplement provides 350 kcal and 20 grams of protein.  Encourage adequate PO intake.   NUTRITION DIAGNOSIS:   Increased nutrient needs related to wound healing as evidenced by estimated needs.  GOAL:   Patient will meet greater than or equal to 90% of their needs  MONITOR:   PO intake, Supplement acceptance, Labs, Weight trends, Skin, I & O's  REASON FOR ASSESSMENT:   Malnutrition Screening Tool    ASSESSMENT:    56 y.o. female well-known to me from multiple prior nonhealing wounds of her feet secondary to autoimmune disease/polymyositis/diabetes. She developed a wound on the lateral aspect of her left foot which has now had continuous clear drainage. 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   Procedure (7/25): Amputation of left fifth toe with resection of metatarsal head  Pt was unavailable during time of visit. RD unable to obtain most recent nutrition history. Meal completion has been 75%. Per weight records, pt with a 9% weight loss in 2 months. Pt currently has Ensure and Prostat ordered and has been consuming them. RD to modify orders to better meet nutrition needs.  Unable to complete Nutrition-Focused physical exam at this time. RD to perform physical exam at next visit.   Labs and medications reviewed.   Diet Order:  Diet Carb Modified Fluid consistency: Thin; Room service appropriate? Yes  Skin:  Wound (see comment) (Stage 3 to buttocks and sacrum, Incision on L foot)  Last BM:  7/25  Height:   Ht Readings from Last 1 Encounters:  05/17/17 5\' 1"  (1.549 m)    Weight:   Wt Readings from Last 1 Encounters:  05/17/17 115 lb (52.2 kg)    Ideal Body Weight:  47.7 kg  BMI:   Body mass index is 21.73 kg/m.  Estimated Nutritional Needs:   Kcal:  1700-1900  Protein:  75-90 grams  Fluid:  1.7 - 1.9 L/day  EDUCATION NEEDS:   No education needs identified at this time  05/19/17, MS, RD, LDN Pager # [phone] After hours/ weekend pager # [phone]

## 2017-05-18 NOTE — Telephone Encounter (Signed)
-----   Message from Sharee Pimple, RN sent at 05/17/2017  3:57 PM EDT ----- Regarding: 2-3 weeks   ----- Message ----- From: Dara Lords, PA-C Sent: 05/17/2017   3:42 PM To: Vvs Charge Pool  S/p Amputation of left fifth toe with resection of metatarsal head 05/17/17.  F/u with Dr. Darrick Penna in 2-3 weeks.  Thanks

## 2017-05-18 NOTE — Clinical Social Work Note (Signed)
Clinical Social Work Assessment  Patient Details  Name: Allison Beasley MRN: [id number] Date of Birth: 04-13-1961  Date of referral:  05/18/17               Reason for consult:  Facility Placement                Permission sought to share information with:    Permission granted to share information::  Yes, Verbal Permission Granted  Name::        Agency::  [facility]  Relationship::     Contact Information:     Housing/Transportation Living arrangements for the past 2 months:  Single Family Home Source of Information:  Patient Patient Interpreter Needed:  None Criminal Activity/Legal Involvement Pertinent to Current Situation/Hospitalization:  No - Comment as needed Significant Relationships:  Adult Children, Spouse Lives with:  Spouse Do you feel safe going back to the place where you live?  No Need for family participation in patient care:  No (Coment)  Care giving concerns:  Pt from [facility]. Pt stated she has resided at [facility] for a few months due to other health issues. She wants to return. She indicated that prior to facility placement she resided with her spouse.  She intends on returning to SNF at DC and thn when medically stable will reside with daughter. She is not safe to return home at this time and will return to SNF.   CSW spoke with [facility] in admissions and they are in agreement with plan to return to SNF. They will only require DC summary at time of DC.     Social Worker assessment / plan:  CSW will assist with transition back to [facility] when medically ready.  Employment status:  Disabled (Comment on whether or not currently receiving Disability) Insurance information:  Other (Comment Required) (BCBS, Medicare secondary) PT Recommendations:   (From [facility] SNF) Information / Referral to community resources:  Skilled Nursing Facility  Patient/Family's Response to care:  Patient appreciative of CSW meeting with her. She reports no issues or  concerns at this time.  Patient/Family's Understanding of and Emotional Response to Diagnosis, Current Treatment, and Prognosis:  Patient has good understanding of diagnosis, current treatment and prognosis. Pt is hopeful that she will improve at SNF and will return home thereafter. No issues or concerns reported at this time.  Emotional Assessment Appearance:  Appears older than stated age Attitude/Demeanor/Rapport:   (Cooperative) Affect (typically observed):  Accepting, Appropriate, Hopeful Orientation:  Oriented to Self, Oriented to Place, Oriented to  Time, Oriented to Situation Alcohol / Substance use:  Not Applicable Psych involvement (Current and /or in the community):  No (Comment)  Discharge Needs  Concerns to be addressed:  Care Coordination Readmission within the last 30 days:  No Current discharge risk:  Dependent with Mobility, Physical Impairment Barriers to Discharge:  No Barriers Identified   Tresa Moore, LCSW 05/18/2017, 11:44 AM

## 2017-05-18 NOTE — Plan of Care (Signed)
Problem: Safety: Goal: Ability to remain free from injury will improve Patients belongings and call light are in reach. Patient calls for assistance when needed.

## 2017-05-18 NOTE — Consult Note (Addendum)
WOC Nurse wound consult note Vascular team following for assessment and plan of care to foot wounds.  Reason for Consult: Consult requested for multiple wounds to bilat ischium, flanks, buttocks.   EMR indicates; "Her underlying disease process has progressed to the point where she has significant calcification subcutaneously." SNF has been treating wounds with Dakins since early spring, pt states they have improved. Wound type: Multiple areas of full thickness wounds related to the disease process, these are NOT pressure injuries. All sites with small amt yellow drainage, no odor. Measurement: Left ischium .3X.3X.2cm, 80% yellow, 20% red Left lower ischium .5X.3X.1cm, 100% red Left upper ischium 7X2.5X.2cm, 30% yellow, 70% red Right flank .3X.3X.2cm, 50% yellow, 50% red Right buttock .2X.2X.1cm, 100% red Right ischium 4X4X.1cm, patchy areas of red partial thickness skin loss Periwound: There are nodules and irregular hard depositis palpable underneath the skin level to bilat buttocks, ischium and thighs which are painful to touch. Dressing procedure/placement/frequency: Air mattress to reduce pressure.  Santyl ointment for enzymatic debridement of nonviable tissue to some wounds. Foam dressings to other sites to promote healing. Discussed plan of care with patient and she verbalized understanding. Please re-consult if further assistance is needed.  Thank-you,  Cammie Mcgee MSN, RN, CWOCN, [place], CNS [phone]

## 2017-05-18 NOTE — Progress Notes (Signed)
[facility] [place]  Subjective  - still with pain   Objective 111/71 83 98.8 F (37.1 C) (Oral) 18 99%  Intake/Output Summary (Last 24 hours) at 05/18/17 0912 Last data filed at 05/17/17 1815  Gross per 24 hour  Intake              737 ml  Output                0 ml  Net              737 ml   Some dried blood on dressing Assessment/Planning: Post toe amp needs another day of pain control and to mobilize SNF tomorrow  Fabienne Bruns 05/18/2017 9:12 AM --  Laboratory Lab Results:  Recent Labs  05/17/17 0844  WBC 11.5*  HGB 10.4*  HCT 34.4*  PLT 366   BMET  Recent Labs  05/17/17 0844  NA 141  K 3.8  CL 109  CO2 23  GLUCOSE 108*  BUN 15  CREATININE 0.55  CALCIUM 9.0    COAG Lab Results  Component Value Date   INR 1.02 01/19/2017   INR 1.11 07/21/2015   INR 1.02 01/13/2013   No results found for: PTT

## 2017-05-18 NOTE — Telephone Encounter (Signed)
Sched appt 05/25/17 at 9:00. Spoke to pt.

## 2017-05-19 ENCOUNTER — Telehealth: Payer: Self-pay | Admitting: Vascular Surgery

## 2017-05-19 DIAGNOSIS — E1169 Type 2 diabetes mellitus with other specified complication: Secondary | ICD-10-CM | POA: Diagnosis not present

## 2017-05-19 DIAGNOSIS — L8990 Pressure ulcer of unspecified site, unspecified stage: Secondary | ICD-10-CM | POA: Insufficient documentation

## 2017-05-19 MED ORDER — COLLAGENASE 250 UNIT/GM EX OINT: TOPICAL_OINTMENT | Freq: Every day | CUTANEOUS | 0 refills | Status: DC

## 2017-05-19 NOTE — Telephone Encounter (Signed)
Resched appt 06/29/17 at 4:00. Spoke to pt.

## 2017-05-19 NOTE — Telephone Encounter (Signed)
-----   Message from Phillips Odor, RN sent at 05/19/2017 12:40 PM EDT ----- Regarding: needs appt rescheduled to 4 weeks from now with [facility]   ----- Message ----- From: Raymond Gurney, PA-C Sent: 05/19/2017   9:16 AM To: Vvs Charge Pool  S/p amputation left 5th toe 05/18/17  Please cancel appt on 05/25/17 and reschedule for appointment 4 weeks from now per [facility].  Thanks Selena Batten

## 2017-05-19 NOTE — Progress Notes (Addendum)
  Progress Note  SUBJECTIVE:    POD #2  Pain is controlled on po pain meds.  OBJECTIVE:   Vitals:   05/18/17 2300 05/19/17 0553  BP: 100/66 114/73  Pulse: 90 78  Resp: 16 16  Temp: 98.6 F (37 C) 98.7 F (37.1 C)    Intake/Output Summary (Last 24 hours) at 05/19/17 0850 Last data filed at 05/18/17 1848  Gross per 24 hour  Intake              960 ml  Output              650 ml  Net              310 ml   Left 5th toe amputation site is clean with sutures intact. Mild skin breakdown left heel.   ASSESSMENT/PLAN:   56 y.o. female is s/p: Amputation of left fifth toe with resection of metatarsal head 2 Days Post-Op   Pain well controlled today. Ok to d/c back to SNF today.   Raymond Gurney 05/19/2017 8:50 AM --  Agree with above.  Follow up 1 month  Fabienne Bruns, MD [facility] [place] Office: [phone] Pager: [phone]  LABS:   CBC    Component Value Date/Time   WBC 11.5 (H) 05/17/2017 0844   HGB 10.4 (L) 05/17/2017 0844   HCT 34.4 (L) 05/17/2017 0844   PLT 366 05/17/2017 0844    BMET    Component Value Date/Time   NA 141 05/17/2017 0844   NA 143 03/13/2017   K 3.8 05/17/2017 0844   CL 109 05/17/2017 0844   CO2 23 05/17/2017 0844   GLUCOSE 108 (H) 05/17/2017 0844   BUN 15 05/17/2017 0844   BUN 8 03/13/2017   CREATININE 0.55 05/17/2017 0844   CALCIUM 9.0 05/17/2017 0844   GFRNONAA >60 05/17/2017 0844   GFRAA >60 05/17/2017 0844    COAG Lab Results  Component Value Date   INR 1.02 01/19/2017   INR 1.11 07/21/2015   INR 1.02 01/13/2013   No results found for: PTT  ANTIBIOTICS:   Anti-infectives    Start     Dose/Rate Route Frequency Ordered Stop   05/17/17 0842  cefUROXime (ZINACEF) 1.5 g in dextrose 5 % 50 mL IVPB     1.5 g 100 mL/hr over 30 Minutes Intravenous 30 min pre-op 05/17/17 0842 05/17/17 1325       Maris Berger, PA-C [facility] Office: [phone] Pager:  [phone] 05/19/2017 8:50 AM

## 2017-05-19 NOTE — Care Management Note (Signed)
Case Management Note  Patient Details  Name: Allison Beasley MRN: [id number] Date of Birth: September 11, 1961  Subjective/Objective:    S/p amputation of left fifth toe with resection of metatarsal head                Action/Plan: Discharge Planning: Chart reviewed. CSW following for SNF placement. Scheduled dc today to SNF.    Expected Discharge Date:  05/19/17               Expected Discharge Plan:  Skilled Nursing Facility  In-House Referral:  Clinical Social Work  Discharge planning Services  CM Consult  Post Acute Care Choice:  NA Choice offered to:  NA  DME Arranged:  N/A DME Agency:  NA  HH Arranged:  NA HH Agency:  NA  Status of Service:  Completed, signed off  If discussed at [employer] of Stay Meetings, dates discussed:    Additional Comments:  Elliot Cousin, RN 05/19/2017, 10:44 AM

## 2017-05-19 NOTE — Social Work (Addendum)
Clinical Social Worker facilitated patient discharge including contacting patient family and facility to confirm patient discharge plans.  Clinical information faxed to facility and family agreeable with plan.    CSW arranged ambulance transport via facility transport to [facility].    RN to call [phone] (Ask for [place]) to give report prior to discharge.   Clinical Social Worker will sign off for now as social work intervention is no longer needed. Please consult [place] again if new need arises.  Keene Breath, LCSW Clinical Social Worker [phone]

## 2017-05-19 NOTE — Clinical Social Work Placement (Signed)
   CLINICAL SOCIAL WORK PLACEMENT  NOTE  Date:  05/19/2017  Patient Details  Name: Allison Beasley MRN: [id number] Date of Birth: June 23, 1961  Clinical Social Work is seeking post-discharge placement for this patient at the Assisted Living Facility level of care (*CSW will initial, date and re-position this form in  chart as items are completed):  Yes   Patient/family provided with [place] Clinical Social Work Department's list of facilities offering this level of care within the geographic area requested by the patient (or if unable, by the patient's family).  Yes   Patient/family informed of their freedom to choose among providers that offer the needed level of care, that participate in Medicare, Medicaid or managed care program needed by the patient, have an available bed and are willing to accept the patient.  Yes   Patient/family informed of [place]'s ownership interest in [facility] and [facility], as well as of the fact that they are under no obligation to receive care at these facilities.  PASRR submitted to EDS on       PASRR number received on       Existing PASRR number confirmed on       FL2 transmitted to all facilities in geographic area requested by pt/family on       FL2 transmitted to all facilities within larger geographic area on       Patient informed that his/her managed care company has contracts with or will negotiate with certain facilities, including the following:        Yes   Patient/family informed of bed offers received.  Patient chooses bed at [facility]     Physician recommends and patient chooses bed at      Patient to be transferred to [facility] on 05/19/17.  Patient to be transferred to facility by [facility]     Patient family notified on 05/19/17 of transfer.  Name of family member notified:  patient responsible for self     PHYSICIAN Please prepare priority discharge summary, including medications, Please sign FL2, Please  prepare prescriptions     Additional Comment:    _______________________________________________ Tresa Moore, LCSW 05/19/2017, 11:30 AM

## 2017-05-19 NOTE — NC FL2 (Signed)
[place] MEDICAID FL2 LEVEL OF CARE SCREENING TOOL     IDENTIFICATION  Patient Name: Allison SYPHER Birthdate: 08-07-1961 Sex: female Admission Date (Current Location): 05/17/2017  [facility] and [place] Number:  Producer, television/film/video and Address:  [facility], [address], [place], [place] [address]      Provider Number: [id number]  Attending Physician Name and Address:  Sherren Kerns, MD  Relative Name and Phone Number:       Current Level of Care: Hospital Recommended Level of Care: Nursing Facility Prior Approval Number:    Date Approved/Denied: 05/19/17 PASRR Number: [id number]  Discharge Plan: SNF    Current Diagnoses: Patient Active Problem List   Diagnosis Date Noted  . Pressure injury of skin 05/19/2017  . PAD (peripheral artery disease) (HCC) 05/17/2017  . Abscess of right foot 10/27/2015  . Ulcer of foot (HCC) 08/19/2015  . Infection 07/21/2015  . Toe infection 05/19/2015  . Pain of great toe 05/07/2015  . Peripheral vascular disease, unspecified (HCC) 04/24/2014  . Pain of right lower extremity 04/10/2014  . Sensation of cold in leg- Right Leg and foot 04/10/2014  . Amputated great toe (HCC) 04/24/2013  . Open wound of great toe 03/21/2013  . Aftercare following surgery of the circulatory system, NEC 02/14/2013  . 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 Atherosclerosis of native arteries of the extremities with gangrene (HCC) 01/24/2013  . Dyspnea 01/13/2013  . Elevated troponin borderline -- no true elevation, resolved. 01/13/2013  . Raynaud's disease 01/13/2013  . Polymyositis (HCC) 01/13/2013  . Fibromyalgia 01/13/2013  . Anemia, iron deficiency 10/25/2012  . Acid reflux 07/30/2012  . Interstitial lung disease (HCC) 07/30/2012  . PNA (pneumonia) 04/09/2012  . Polymyositis associated with autoimmune disease (HCC) 08/15/2011    Orientation RESPIRATION BLADDER Height  & Weight     Self, Time, Situation, Place  Normal Continent, External catheter Weight: 115 lb (52.2 kg) Height:  5\' 1"  (154.9 cm)  BEHAVIORAL SYMPTOMS/MOOD NEUROLOGICAL BOWEL NUTRITION STATUS      Continent Diet (See DC Summary)  AMBULATORY STATUS COMMUNICATION OF NEEDS Skin     Verbally PU Stage and Appropriate Care (Gauze dressing)                       Personal Care Assistance Level of Assistance              Functional Limitations Info             SPECIAL CARE FACTORS FREQUENCY                       Contractures      Additional Factors Info  Code Status, Allergies, Psychotropic Code Status Info: Full Code Allergies Info: CLINDAMYCIN/LINCOMYCIN, DOXYCYCLINE, PERCOCET OXYCODONE-ACETAMINOPHEN, CIPROFLOXACIN, MYCOPHENOLATE  Psychotropic Info: Cymbalta, Risperidone         Current Medications (05/19/2017):  This is the current hospital active medication list Current Facility-Administered Medications  Medication Dose Route Frequency Provider Last Rate Last Dose  . 0.9 %  sodium chloride infusion  250 mL Intravenous PRN Rhyne, Samantha J, PA-C      . acetaminophen (TYLENOL) tablet 325-650 mg  325-650 mg Oral Q4H PRN [birth year]1977, PA-C   650 mg at 05/18/17 1738   Or  . acetaminophen (TYLENOL) suppository 325-650 mg  325-650 mg Rectal Q4H PRN Rhyne, 05/20/17, PA-C      .  alum & mag hydroxide-simeth (MAALOX/MYLANTA) 200-200-20 MG/5ML suspension 15-30 mL  15-30 mL Oral Q2H PRN Rhyne, Samantha J, PA-C      . amLODipine (NORVASC) tablet 5 mg  5 mg Oral Daily Carney, Gwenlyn Found, RPH      . aspirin EC tablet 81 mg  81 mg Oral Daily Dara Lords, PA-C   81 mg at 05/19/17 0846  . cholecalciferol (VITAMIN D) tablet 1,000 Units  1,000 Units Oral Daily Dara Lords, PA-C   1,000 Units at 05/19/17 0847  . collagenase (SANTYL) ointment   Topical Daily Fields, Janetta Hora, MD      . DULoxetine (CYMBALTA) DR capsule 60 mg  60 mg Oral Daily Dara Lords,  PA-C   60 mg at 05/19/17 0846  . famotidine (PEPCID) tablet 20 mg  20 mg Oral BID Dara Lords, PA-C   20 mg at 05/19/17 0847  . feeding supplement (ENSURE ENLIVE) (ENSURE ENLIVE) liquid 237 mL  237 mL Oral Q1500 Fields, Janetta Hora, MD      . feeding supplement (PRO-STAT SUGAR FREE 64) liquid 30 mL  30 mL Oral BID Rhyne, Samantha J, PA-C   30 mL at 05/19/17 0848  . guaiFENesin-dextromethorphan (ROBITUSSIN DM) 100-10 MG/5ML syrup 15 mL  15 mL Oral Q4H PRN Rhyne, Samantha J, PA-C      . hydrALAZINE (APRESOLINE) injection 5 mg  5 mg Intravenous Q20 Min PRN Rhyne, Samantha J, PA-C      . labetalol (NORMODYNE,TRANDATE) injection 10 mg  10 mg Intravenous Q10 min PRN Rhyne, [place]es Coupe, PA-C      . Melatonin TABS 3 mg  3 mg Oral QHS Rhyne, Samantha J, PA-C   3 mg at 05/19/17 0017  . metoprolol tartrate (LOPRESSOR) injection 2-5 mg  2-5 mg Intravenous Q2H PRN Rhyne, Samantha J, PA-C      . metoprolol tartrate (LOPRESSOR) tablet 25 mg  25 mg Oral Q8H Rhyne, Samantha J, PA-C   25 mg at 05/19/17 0739  . morphine 4 MG/ML injection 1-2 mg  1-2 mg Intravenous Q3H PRN Sherren Kerns, MD   2 mg at 05/18/17 2016  . multivitamin with minerals tablet 1 tablet  1 tablet Oral Daily Dara Lords, PA-C   1 tablet at 05/19/17 0847  . ondansetron (ZOFRAN) injection 4 mg  4 mg Intravenous Q6H PRN Rhyne, Samantha J, PA-C      . oxyCODONE (Oxy IR/ROXICODONE) immediate release tablet 5 mg  5 mg Oral Q6H PRN Rhyne, Samantha J, PA-C   5 mg at 05/19/17 0738  . pantoprazole (PROTONIX) EC tablet 40 mg  40 mg Oral Daily Rhyne, Samantha J, PA-C   40 mg at 05/19/17 0847  . phenol (CHLORASEPTIC) mouth spray 1 spray  1 spray Mouth/Throat PRN Rhyne, Samantha J, PA-C      . potassium chloride SA (K-DUR,KLOR-CON) CR tablet 20-40 mEq  20-40 mEq Oral Once Rhyne, Samantha J, PA-C      . predniSONE (DELTASONE) tablet 5 mg  5 mg Oral Q breakfast Rhyne, Samantha J, PA-C   5 mg at 05/19/17 0738  . risperiDONE (RISPERDAL) tablet 0.5 mg   0.5 mg Oral QHS Rhyne, Samantha J, PA-C   0.5 mg at 05/19/17 0016  . senna-docusate (Senokot-S) tablet 1 tablet  1 tablet Oral QHS Rhyne, Samantha J, PA-C      . sodium chloride flush (NS) 0.9 % injection 3 mL  3 mL Intravenous Q12H Rhyne, Samantha J, PA-C   3 mL  at 05/19/17 1000  . sodium chloride flush (NS) 0.9 % injection 3 mL  3 mL Intravenous PRN Rhyne, Samantha J, PA-C      . tacrolimus (PROGRAF) capsule 2 mg  2 mg Oral BID Rhyne, Samantha J, PA-C   2 mg at 05/19/17 6226     Discharge Medications: Please see discharge summary for a list of discharge medications.  Relevant Imaging Results:  Relevant Lab Results:   Additional Information SS#:[id number]  Tresa Moore, LCSW

## 2017-05-19 NOTE — Progress Notes (Signed)
Pt ready for d/c back to SNF today. Report called to [place] at [facility], all questions answered. Discharge instructions reviewed with pt, and given hard prescription. Pt will be transported to facility via their wheelchair [place] per SW.   [place], Latricia Heft

## 2017-05-22 ENCOUNTER — Encounter (HOSPITAL_COMMUNITY): Payer: Self-pay | Admitting: Vascular Surgery

## 2017-05-22 NOTE — Anesthesia Postprocedure Evaluation (Signed)
Anesthesia Post Note  Patient: Allison Beasley  Procedure(s) Performed: Procedure(s) (LRB): AMPUTATION LEFT FIFTH TOE (Left)     Patient location during evaluation: PACU Anesthesia Type: Regional and MAC Level of consciousness: awake Pain management: pain level controlled Vital Signs Assessment: post-procedure vital signs reviewed and stable Respiratory status: spontaneous breathing Cardiovascular status: stable Postop Assessment: no signs of nausea or vomiting Anesthetic complications: no    Last Vitals:  Vitals:   05/18/17 2300 05/19/17 0553  BP: 100/66 114/73  Pulse: 90 78  Resp: 16 16  Temp: 37 C 37.1 C    Last Pain:  Vitals:   05/19/17 0946  TempSrc:   PainSc: 3    Pain Goal: Patients Stated Pain Goal: 6 (05/17/17 0942)               Arkel Cartwright JR,JOHN Mateo Flow

## 2017-05-23 ENCOUNTER — Encounter (HOSPITAL_COMMUNITY): Payer: Self-pay | Admitting: Vascular Surgery

## 2017-05-23 NOTE — Addendum Note (Signed)
Addendum  created 05/23/17 1607 by Dorris Singh, MD   Anesthesia Attestations filed, Sign clinical note

## 2017-05-25 ENCOUNTER — Encounter: Payer: BLUE CROSS/BLUE SHIELD | Admitting: Vascular Surgery

## 2017-06-21 ENCOUNTER — Encounter: Payer: Self-pay | Admitting: Vascular Surgery

## 2017-06-27 ENCOUNTER — Telehealth: Payer: Self-pay | Admitting: *Deleted

## 2017-06-27 NOTE — Telephone Encounter (Signed)
Shanda Bumps, nurse with [facility] called stating their wound nurse reported patient's incision from amputation site, left fifth toe had " pus like " drainage and states the area shows some slight redness.  The nurse denies any odor or fever,  Patient has an appointment Thursday, June 29, 2017 with Dr Darrick Penna.  Shanda Bumps is to call tomorrow, Wednesday, June 28, 2017 if symptoms should worsen.

## 2017-06-29 ENCOUNTER — Encounter: Payer: Self-pay | Admitting: Vascular Surgery

## 2017-06-29 ENCOUNTER — Ambulatory Visit (INDEPENDENT_AMBULATORY_CARE_PROVIDER_SITE_OTHER): Payer: Self-pay | Admitting: Vascular Surgery

## 2017-06-29 VITALS — BP 117/74 | HR 85 | Temp 97.6°F | Ht 61.0 in | Wt 111.0 lb

## 2017-06-29 DIAGNOSIS — I739 Peripheral vascular disease, unspecified: Secondary | ICD-10-CM

## 2017-06-29 NOTE — Progress Notes (Signed)
Patient is a 56 year old female who returns for follow-up today. She underwent amputation of her left fifth toe with resection of the metatarsal head approximate 6 weeks ago. She states she has had a little bit of drainage from the incision. She still has some swelling in the foot. She wants to start bearing weight. She currently resides in a skilled nursing facility.  Physical exam:  Vitals:   06/29/17 1533  BP: 117/74  Pulse: 85  Temp: 97.6 F (36.4 C)  TempSrc: Oral  SpO2: 98%  Weight: 111 lb (50.3 kg)  Height: 5\' 1"  (1.549 m)    Extremities: Left foot well approximated incision some dried clear drainage but nothing expressible trace edema in the foot no erythema no fluctuance. Sutures removed today from left foot  Assessment: Doing well from left fifth toe and metatarsal amputation. The incision is still fairly fragile at this point. I did tell the patient that I think it's okay for her to start bearing weight on the heel. She will see me back in follow-up in 2 weeks and hopefully at that time if it is continuing to heal we can start full weightbearing on the left foot.  Plan: See above  , MD [facility] Office: [phone] Pager: [phone]

## 2017-07-13 ENCOUNTER — Ambulatory Visit (INDEPENDENT_AMBULATORY_CARE_PROVIDER_SITE_OTHER): Payer: Self-pay | Admitting: Vascular Surgery

## 2017-07-13 ENCOUNTER — Encounter: Payer: Self-pay | Admitting: Vascular Surgery

## 2017-07-13 VITALS — BP 113/69 | HR 70 | Temp 97.5°F | Resp 16 | Ht 61.0 in | Wt 111.0 lb

## 2017-07-13 DIAGNOSIS — I739 Peripheral vascular disease, unspecified: Secondary | ICD-10-CM

## 2017-07-13 NOTE — Progress Notes (Signed)
Patient is a 56 year old female who returns for follow-up today after amputation of left fifth toe with the tarsal head resection. She has no drainage from the incision. Incision is well-healed. I believe she is okay for weightbearing on this at this point. She'll follow-up with [place] on an as-needed basis.  Fabienne Bruns, MD [facility] [place] Office: [phone] Pager: [phone]

## 2017-07-31 ENCOUNTER — Ambulatory Visit: Payer: BLUE CROSS/BLUE SHIELD | Admitting: Podiatry

## 2017-08-22 ENCOUNTER — Ambulatory Visit: Payer: BLUE CROSS/BLUE SHIELD | Admitting: Podiatry

## 2017-08-26 ENCOUNTER — Encounter (HOSPITAL_COMMUNITY): Payer: Self-pay

## 2017-08-26 ENCOUNTER — Emergency Department (HOSPITAL_COMMUNITY): Payer: BLUE CROSS/BLUE SHIELD

## 2017-08-26 ENCOUNTER — Inpatient Hospital Stay (HOSPITAL_COMMUNITY)
Admission: EM | Admit: 2017-08-26 | Discharge: 2017-08-31 | DRG: 470 | Disposition: A | Discharge status: Home Health | Payer: BLUE CROSS/BLUE SHIELD | Attending: Internal Medicine | Admitting: Internal Medicine

## 2017-08-26 DIAGNOSIS — Z23 Encounter for immunization: Secondary | ICD-10-CM

## 2017-08-26 DIAGNOSIS — Z419 Encounter for procedure for purposes other than remedying health state, unspecified: Secondary | ICD-10-CM

## 2017-08-26 DIAGNOSIS — Z792 Long term (current) use of antibiotics: Secondary | ICD-10-CM

## 2017-08-26 DIAGNOSIS — S42034A Nondisplaced fracture of lateral end of right clavicle, initial encounter for closed fracture: Secondary | ICD-10-CM

## 2017-08-26 DIAGNOSIS — M3320 Polymyositis, organ involvement unspecified: Secondary | ICD-10-CM | POA: Diagnosis present

## 2017-08-26 DIAGNOSIS — Z96641 Presence of right artificial hip joint: Secondary | ICD-10-CM

## 2017-08-26 DIAGNOSIS — Z6820 Body mass index (BMI) 20.0-20.9, adult: Secondary | ICD-10-CM

## 2017-08-26 DIAGNOSIS — M797 Fibromyalgia: Secondary | ICD-10-CM | POA: Diagnosis present

## 2017-08-26 DIAGNOSIS — Z885 Allergy status to narcotic agent status: Secondary | ICD-10-CM

## 2017-08-26 DIAGNOSIS — K219 Gastro-esophageal reflux disease without esophagitis: Secondary | ICD-10-CM | POA: Diagnosis present

## 2017-08-26 DIAGNOSIS — E440 Moderate protein-calorie malnutrition: Secondary | ICD-10-CM | POA: Diagnosis present

## 2017-08-26 DIAGNOSIS — I7300 Raynaud's syndrome without gangrene: Secondary | ICD-10-CM | POA: Diagnosis present

## 2017-08-26 DIAGNOSIS — M25561 Pain in right knee: Secondary | ICD-10-CM

## 2017-08-26 DIAGNOSIS — Z89412 Acquired absence of left great toe: Secondary | ICD-10-CM

## 2017-08-26 DIAGNOSIS — M1990 Unspecified osteoarthritis, unspecified site: Secondary | ICD-10-CM | POA: Diagnosis present

## 2017-08-26 DIAGNOSIS — D696 Thrombocytopenia, unspecified: Secondary | ICD-10-CM | POA: Diagnosis present

## 2017-08-26 DIAGNOSIS — E1151 Type 2 diabetes mellitus with diabetic peripheral angiopathy without gangrene: Secondary | ICD-10-CM | POA: Diagnosis present

## 2017-08-26 DIAGNOSIS — D62 Acute posthemorrhagic anemia: Secondary | ICD-10-CM | POA: Diagnosis not present

## 2017-08-26 DIAGNOSIS — W109XXA Fall (on) (from) unspecified stairs and steps, initial encounter: Secondary | ICD-10-CM | POA: Diagnosis present

## 2017-08-26 DIAGNOSIS — Z7952 Long term (current) use of systemic steroids: Secondary | ICD-10-CM

## 2017-08-26 DIAGNOSIS — I10 Essential (primary) hypertension: Secondary | ICD-10-CM | POA: Diagnosis present

## 2017-08-26 DIAGNOSIS — Z79899 Other long term (current) drug therapy: Secondary | ICD-10-CM

## 2017-08-26 DIAGNOSIS — S41102A Unspecified open wound of left upper arm, initial encounter: Secondary | ICD-10-CM | POA: Diagnosis present

## 2017-08-26 DIAGNOSIS — S72001A Fracture of unspecified part of neck of right femur, initial encounter for closed fracture: Secondary | ICD-10-CM | POA: Diagnosis not present

## 2017-08-26 DIAGNOSIS — Z7982 Long term (current) use of aspirin: Secondary | ICD-10-CM

## 2017-08-26 DIAGNOSIS — M069 Rheumatoid arthritis, unspecified: Secondary | ICD-10-CM | POA: Diagnosis present

## 2017-08-26 DIAGNOSIS — W19XXXA Unspecified fall, initial encounter: Secondary | ICD-10-CM

## 2017-08-26 DIAGNOSIS — Z89411 Acquired absence of right great toe: Secondary | ICD-10-CM

## 2017-08-26 DIAGNOSIS — Z881 Allergy status to other antibiotic agents status: Secondary | ICD-10-CM

## 2017-08-26 DIAGNOSIS — Z888 Allergy status to other drugs, medicaments and biological substances status: Secondary | ICD-10-CM

## 2017-08-26 DIAGNOSIS — F329 Major depressive disorder, single episode, unspecified: Secondary | ICD-10-CM | POA: Diagnosis present

## 2017-08-26 DIAGNOSIS — Z8669 Personal history of other diseases of the nervous system and sense organs: Secondary | ICD-10-CM

## 2017-08-26 DIAGNOSIS — M8580 Other specified disorders of bone density and structure, unspecified site: Secondary | ICD-10-CM | POA: Diagnosis present

## 2017-08-26 DIAGNOSIS — R52 Pain, unspecified: Secondary | ICD-10-CM

## 2017-08-26 DIAGNOSIS — J849 Interstitial pulmonary disease, unspecified: Secondary | ICD-10-CM | POA: Diagnosis present

## 2017-08-26 DIAGNOSIS — F32A Depression, unspecified: Secondary | ICD-10-CM | POA: Diagnosis present

## 2017-08-26 DIAGNOSIS — G709 Myoneural disorder, unspecified: Secondary | ICD-10-CM | POA: Diagnosis present

## 2017-08-26 DIAGNOSIS — F419 Anxiety disorder, unspecified: Secondary | ICD-10-CM | POA: Diagnosis present

## 2017-08-26 DIAGNOSIS — Y92019 Unspecified place in single-family (private) house as the place of occurrence of the external cause: Secondary | ICD-10-CM

## 2017-08-26 LAB — BASIC METABOLIC PANEL
Anion gap: 10 (ref 5–15)
BUN: 14 mg/dL (ref 6–20)
CHLORIDE: 101 mmol/L (ref 101–111)
CO2: 20 mmol/L — AB (ref 22–32)
Calcium: 8.2 mg/dL — ABNORMAL LOW (ref 8.9–10.3)
Creatinine, Ser: 0.85 mg/dL (ref 0.44–1.00)
GFR calc Af Amer: >60 mL/min (ref >60)
GFR calc non Af Amer: >60 mL/min (ref >60)
Glucose, Bld: 108 mg/dL — ABNORMAL HIGH (ref 65–99)
POTASSIUM: 3.8 mmol/L (ref 3.5–5.1)
Sodium: 131 mmol/L — ABNORMAL LOW (ref 135–145)

## 2017-08-26 MED ORDER — FENTANYL CITRATE (PF) 100 MCG/2ML IJ SOLN
100.0000 ug | Freq: Once | INTRAMUSCULAR | Status: AC
  Administered 2017-08-26: 100 ug via INTRAVENOUS
  Filled 2017-08-26: qty 2

## 2017-08-26 NOTE — ED Triage Notes (Signed)
Per EMS, pt fell going up 2 brick stairs. C/o pain to right shoulder and right hip. Pt cannot move right arm or hip. Denies loc or hitting head. VSS. 10/10 pain. fentanyl intranasal.

## 2017-08-26 NOTE — ED Notes (Signed)
Patient transported to X-ray 

## 2017-08-26 NOTE — ED Provider Notes (Signed)
[facility] EMERGENCY DEPARTMENT Provider Note   CSN: [id number] Arrival date & time: 08/26/17  2248     History   Chief Complaint Chief Complaint  Patient presents with  . Fall    HPI Allison Beasley is a 56 y.o. female.  Patient presents to the ED with a chief complaint of fall.  She states that she was going up some steps and tripped on the door frame.  She states that she fell landing on her right side.  She complains of right shoulder and right hip pain.  She has been unable to ambulate since the incident.  She has received intranasal fentanyl with mild improvement of her pain.  She denies any head injury or LOC.  Denies any numbness, weakness, or tingling.  She denies any chest pain or SOB.  She states that her symptoms are worsened with movement and palpation.   The history is provided by the patient. No language interpreter was used.    Past Medical History:  Diagnosis Date  . Anemia   . Anxiety   . Constipation   . Depression   . Fibromyalgia   . GERD (gastroesophageal reflux disease)   . H. influenzae infection 2018  . Headache(784.0)   . Hypertension   . Interstitial pulmonary disease, unspecified (HCC) 2018  . Open wound of left buttock 56/2018  . Osteoarthritis   . Osteopenia   . PAD (peripheral artery disease) (HCC)   . Peripheral vascular disease (HCC)   . Pneumonia 03/2012  . Polymyositis (HCC)   . Pressure ulcer of buttock 02/2017   right Buttock-   . Pressure ulcer- Left Heel 2018   unstageable   . Raynaud phenomenon   . Rheumatoid arthritis (HCC)   . Seizures (HCC)    with chid birth 64f 2 childre  . Sepsis (HCC) 2018  . Shortness of breath    with exertion  . Type II diabetes mellitus (HCC)    on no medications    Patient Active Problem List   Diagnosis Date Noted  . Pressure injury of skin 05/19/2017  . PAD (peripheral artery disease) (HCC) 05/17/2017  . Abscess of right foot 10/27/2015  . Ulcer of foot (HCC)  08/19/2015  . Infection 07/21/2015  . Toe infection 05/19/2015  . Pain of great toe 05/07/2015  . Peripheral vascular disease, unspecified (HCC) 04/24/2014  . Pain of right lower extremity 04/10/2014  . Sensation of cold in leg- Right Leg and foot 04/10/2014  . Amputated great toe (HCC) 04/24/2013  . Open wound of great toe 03/21/2013  . Aftercare following surgery of the circulatory system, NEC 02/14/2013  . 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 Atherosclerosis of native arteries of the extremities with gangrene (HCC) 01/24/2013  . Dyspnea 01/13/2013  . Elevated troponin borderline -- no true elevation, resolved. 01/13/2013  . Raynaud's disease 01/13/2013  . Polymyositis (HCC) 01/13/2013  . Fibromyalgia 01/13/2013  . Anemia, iron deficiency 10/25/2012  . Acid reflux 07/30/2012  . Interstitial lung disease (HCC) 07/30/2012  . PNA (pneumonia) 04/09/2012  . 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 ABDOMINAL AORTAGRAM N/A 07/25/2014   Procedure: ABDOMINAL Ronny Flurry;  Surgeon: Sherren Kerns, MD;  Location: [facility] CATH LAB;  Service: Cardiovascular;  Laterality: N/A;  . AMPUTATION Left 12/24/2012   Procedure: AMPUTATION DIGIT left great toe;  Surgeon: Sherren Kerns, MD;  Location:  [facility] OR;  Service: Vascular;  Laterality: Left;  GREAT  . AMPUTATION Right 05/19/2015   Procedure: AMPUTATION DIGIT;  Surgeon: Sherren Kerns, MD;  Location: [facility] OR;  Service: Vascular;  Laterality: Right;  . AMPUTATION Right 07/22/2015   Procedure: RAY AMPUTATION OF RIGHT GREAT TOE AMPUTATION SITE;  Surgeon: Sherren Kerns, MD;  Location: [facility] OR;  Service: Vascular;  Laterality: Right;  . AMPUTATION Left 05/17/2017   Procedure: AMPUTATION LEFT FIFTH TOE;  Surgeon: Sherren Kerns, MD;  Location: [facility] OR;  Service: Vascular;  Laterality: Left;  . APPLICATION OF A-CELL OF EXTREMITY  Right 11/12/2015   Procedure: INTE[employer]RA;  Surgeon: Alena Bills Dillingham, DO;  Location: [facility] OR;  Service: Plastics;  Laterality: Right;  . CESAREAN SECTION     x3  . COLONOSCOPY    . HYSTEROSCOPY     w/ D&C with excision of endometrial polyps  . I&D EXTREMITY Right 10/26/2015   Procedure: IRRIGATION AND DEBRIDEMENT EXTREMITY/RIGHT FOOT;  Surgeon: Fransisco Hertz, MD;  Location: [facility] OR;  Service: Vascular;  Laterality: Right;  . I&D EXTREMITY Right 10/29/2015   Procedure: IRRIGATION AND DEBRIDEMENT RIGHT FOOT WITH ACELL AND VAC PLACEMENT;  Surgeon: Alena Bills Dillingham, DO;  Location: [facility] OR;  Service: Plastics;  Laterality: Right;  . I&D EXTREMITY Right 11/12/2015   Procedure: IRRIGATION AND DEBRIDEMENT RIGHT EXTREMITY;  Surgeon: Alena Bills Dillingham, DO;  Location: [facility] OR;  Service: Plastics;  Laterality: Right;  . IRRIGATION AND DEBRIDEMENT FOOT Right 10/26/2015  . left great toenail removal  Left 2013   done by [place] [facility]  . MINOR APPLICATION OF WOUND VAC Right 11/12/2015   Procedure: MINOR APPLICATION OF WOUND VAC;  Surgeon: Alena Bills Dillingham, DO;  Location: [facility] OR;  Service: Plastics;  Laterality: Right;  . PERIPHERAL VASCULAR CATHETERIZATION N/A 05/15/2015   Procedure: Abdominal Aortogram;  Surgeon: Sherren Kerns, MD;  Location: [facility] INVASIVE CV LAB;  Service: Cardiovascular;  Laterality: N/A;  . SKIN SPLIT GRAFT Right 12/02/2015   Procedure: SKIN GRAFT SPLIT THICKNESS RIGHT FOOT WITH PLACEMENT VAC DRESSING;  Surgeon: Alena Bills Dillingham, DO;  Location: [facility] OR;  Service: Plastics;  Laterality: Right;  . TOE AMPUTATION Left 05/17/2017   5th toe  . TONSILLECTOMY Bilateral   . TUBAL LIGATION      OB History    Gravida Para Term Preterm AB Living   4 3     1 3    SAB TAB Ectopic Multiple Live Births                   Home Medications    Prior to Admission medications   Medication Sig Start Date End Date Taking? Authorizing Provider  Amino Acids-Protein Hydrolys (FEEDING SUPPLEMENT, PRO-STAT  SUGAR FREE 64,) LIQD Take 30 mLs by mouth 2 (two) times daily.    [provider]  amLODipine (NORVASC) 5 MG tablet Take 5 mg by mouth daily. (0900)    [provider]  aspirin EC 81 MG tablet Take 81 mg by mouth daily. (0900)    [provider]  Cholecalciferol 1000 units tablet Take 1,000 Units by mouth daily. (0900)    [provider]  collagenase (SANTYL) ointment Apply topically daily. 05/20/17   Raymond Gurney, PA-C  DULoxetine (CYMBALTA) 60 MG capsule Take 60 mg by mouth daily. (0900)    [provider]  famotidine (PEPCID) 20 MG tablet Take 20 mg by mouth 2 (two) times daily.    [provider]  Lavender Oil OIL Apply 1 application topically every hour as needed (apply 1 drop behind each ear as needed for agitation/anxiety/insomnia or restlessness).    [provider]  Melatonin 3 MG TABS Take 3 mg by mouth at bedtime.    [provider]  metoprolol tartrate (LOPRESSOR) 25 MG tablet Take 25 mg by mouth every 8 (eight) hours.    [provider]  Multiple Vitamin (MULTIVITAMIN) tablet Take 1 tablet by mouth daily. (0900)    [provider]  Omega-3 Fatty Acids (FISH OIL) 1000 MG CAPS Take 1,000 mg by mouth daily. (0900)    [provider]  oxyCODONE (OXY IR/ROXICODONE) 5 MG immediate release tablet Take 0.5 tablets (2.5 mg total) by mouth 3 (three) times daily. 05/17/17   Rhyne, Ames Coupe, PA-C  predniSONE (DELTASONE) 5 MG tablet Take 5 mg by mouth daily with breakfast.    [provider]  Psyllium (REGULOID PO) Take 15 mLs by mouth 2 (two) times daily. Give 1 tablespoon by mouth 2 times daily     [provider]  risperiDONE (RISPERDAL) 0.5 MG tablet Take 0.5 mg by mouth at bedtime. (2100)    [provider]  sennosides-docusate sodium (SENOKOT-S) 8.6-50 MG tablet Take 1 tablet by mouth at bedtime.    [provider]  Skin Protectants, Misc. (EUCERIN) cream  Apply 1 application topically daily. Apply to whole body    [provider]  tacrolimus (PROGRAF) 1 MG capsule Take 2 mg by mouth 2 (two) times daily.    [provider]  UNABLE TO FIND Med Name: Med pass 120 mL by mouth 2 times daily    [provider]    Family History Family History  Problem Relation Age of Onset  . Diabetes Mother   . Hypertension Mother   . Diabetes Father   . Heart disease Father   . Hypertension Sister   . Heart disease Sister     Social History Social History  Substance Use Topics  . Smoking status: Never Smoker  . Smokeless tobacco: Never Used  . Alcohol use No     Comment: 05/19/2015 "might have a couple drinks/yr"     Allergies   Clindamycin/lincomycin; Doxycycline; Percocet [oxycodone-acetaminophen]; Ciprofloxacin; and Mycophenolate   Review of Systems Review of Systems  All other systems reviewed and are negative.    Physical Exam Updated Vital Signs BP (!) 154/92 (BP Location: Left Arm)   Pulse 60   Temp 98.1 F (36.7 C) (Allison)   Resp (!) 22   LMP 06/20/2012   SpO2 100%   Physical Exam  Constitutional: She is oriented to person, place, and time. She appears well-developed and well-nourished.  HENT:  Head: Normocephalic and atraumatic.  Eyes: Pupils are equal, round, and reactive to light. Conjunctivae and EOM are normal.  Neck: Normal range of motion. Neck supple.  Cardiovascular: Normal rate and regular rhythm.  Exam reveals no gallop and no friction rub.   No murmur heard. Pulmonary/Chest: Effort normal and breath sounds normal. No respiratory distress. She has no wheezes. She has no rales. She exhibits no tenderness.  Abdominal: Soft. Bowel sounds are normal. She exhibits no distension and no mass. There is no tenderness. There is no rebound and no guarding.  Musculoskeletal: She exhibits tenderness. She exhibits no edema.  Right clavicle TTP, no bony abnormality or deformity of the right  shoulder Right hip TTP, no bony abnormality or deformity of the right hip Lumbar spine TTP, no bony  step off or deformity  Neurological: She is alert and oriented to person, place, and time.  Sensation intact  Skin: Skin is warm and dry.  Psychiatric: She has a normal mood and affect. Her behavior is normal. Judgment and thought content normal.  Nursing note and vitals reviewed.    ED Treatments / Results  Labs (all labs ordered are listed, but only abnormal results are displayed) Labs Reviewed  CBC WITH DIFFERENTIAL/PLATELET  BASIC METABOLIC PANEL    EKG  EKG Interpretation None       Radiology No results found.  Procedures Procedures (including critical care time)  Medications Ordered in ED Medications  fentaNYL (SUBLIMAZE) injection 100 mcg (100 mcg Intravenous Given 08/26/17 2319)     Initial Impression / Assessment and Plan / ED Course  I have reviewed the triage vital signs and the nursing notes.  Pertinent labs & imaging results that were available during my care of the patient were reviewed by me and considered in my medical decision making (see chart for details).     Patient with mechanical fall.  Injuries to right shoulder and right hip.  Will check labs and appropriate imaging.  Patient with right femoral neck fracture and right distal clavicle.    Discussed with Dr. Magnus Ivan from ortho.  He states that he will take the patient to OR around noon because of multiple cases in front of her.  Will admit to medicine.  Patient to be kept NPO.  Final Clinical Impressions(s) / ED Diagnoses   Final diagnoses:  Closed fracture of neck of right femur, initial encounter (HCC)  Closed nondisplaced fracture of acromial end of right clavicle, initial encounter    New Prescriptions New Prescriptions   No medications on file     Roxy Horseman, PA-C 08/27/17 0155    Gilda Crease, MD 08/27/17 [phone]

## 2017-08-27 ENCOUNTER — Inpatient Hospital Stay (HOSPITAL_COMMUNITY): Payer: BLUE CROSS/BLUE SHIELD

## 2017-08-27 ENCOUNTER — Inpatient Hospital Stay (HOSPITAL_COMMUNITY): Payer: BLUE CROSS/BLUE SHIELD | Admitting: Certified Registered Nurse Anesthetist

## 2017-08-27 ENCOUNTER — Encounter (HOSPITAL_COMMUNITY)
Admission: EM | Disposition: A | Discharge status: Home Health | Payer: Self-pay | Source: Home / Self Care | Attending: Internal Medicine

## 2017-08-27 DIAGNOSIS — M3320 Polymyositis, organ involvement unspecified: Secondary | ICD-10-CM | POA: Diagnosis present

## 2017-08-27 DIAGNOSIS — Z881 Allergy status to other antibiotic agents status: Secondary | ICD-10-CM | POA: Diagnosis not present

## 2017-08-27 DIAGNOSIS — Z885 Allergy status to narcotic agent status: Secondary | ICD-10-CM | POA: Diagnosis not present

## 2017-08-27 DIAGNOSIS — D62 Acute posthemorrhagic anemia: Secondary | ICD-10-CM | POA: Diagnosis not present

## 2017-08-27 DIAGNOSIS — M1990 Unspecified osteoarthritis, unspecified site: Secondary | ICD-10-CM | POA: Diagnosis present

## 2017-08-27 DIAGNOSIS — S72001A Fracture of unspecified part of neck of right femur, initial encounter for closed fracture: Secondary | ICD-10-CM | POA: Diagnosis not present

## 2017-08-27 DIAGNOSIS — I10 Essential (primary) hypertension: Secondary | ICD-10-CM

## 2017-08-27 DIAGNOSIS — M069 Rheumatoid arthritis, unspecified: Secondary | ICD-10-CM | POA: Diagnosis present

## 2017-08-27 DIAGNOSIS — G709 Myoneural disorder, unspecified: Secondary | ICD-10-CM | POA: Diagnosis present

## 2017-08-27 DIAGNOSIS — S41102A Unspecified open wound of left upper arm, initial encounter: Secondary | ICD-10-CM | POA: Diagnosis present

## 2017-08-27 DIAGNOSIS — D696 Thrombocytopenia, unspecified: Secondary | ICD-10-CM | POA: Diagnosis present

## 2017-08-27 DIAGNOSIS — J849 Interstitial pulmonary disease, unspecified: Secondary | ICD-10-CM | POA: Diagnosis present

## 2017-08-27 DIAGNOSIS — I7300 Raynaud's syndrome without gangrene: Secondary | ICD-10-CM | POA: Diagnosis present

## 2017-08-27 DIAGNOSIS — M8580 Other specified disorders of bone density and structure, unspecified site: Secondary | ICD-10-CM | POA: Diagnosis present

## 2017-08-27 DIAGNOSIS — Z23 Encounter for immunization: Secondary | ICD-10-CM | POA: Diagnosis not present

## 2017-08-27 DIAGNOSIS — W109XXA Fall (on) (from) unspecified stairs and steps, initial encounter: Secondary | ICD-10-CM | POA: Diagnosis present

## 2017-08-27 DIAGNOSIS — K219 Gastro-esophageal reflux disease without esophagitis: Secondary | ICD-10-CM | POA: Diagnosis present

## 2017-08-27 DIAGNOSIS — F419 Anxiety disorder, unspecified: Secondary | ICD-10-CM | POA: Diagnosis present

## 2017-08-27 DIAGNOSIS — Y92019 Unspecified place in single-family (private) house as the place of occurrence of the external cause: Secondary | ICD-10-CM | POA: Diagnosis not present

## 2017-08-27 DIAGNOSIS — Z6820 Body mass index (BMI) 20.0-20.9, adult: Secondary | ICD-10-CM | POA: Diagnosis not present

## 2017-08-27 DIAGNOSIS — S42034A Nondisplaced fracture of lateral end of right clavicle, initial encounter for closed fracture: Secondary | ICD-10-CM | POA: Diagnosis present

## 2017-08-27 DIAGNOSIS — M25561 Pain in right knee: Secondary | ICD-10-CM | POA: Diagnosis present

## 2017-08-27 DIAGNOSIS — F329 Major depressive disorder, single episode, unspecified: Secondary | ICD-10-CM | POA: Diagnosis present

## 2017-08-27 DIAGNOSIS — E1151 Type 2 diabetes mellitus with diabetic peripheral angiopathy without gangrene: Secondary | ICD-10-CM | POA: Diagnosis present

## 2017-08-27 DIAGNOSIS — W19XXXA Unspecified fall, initial encounter: Secondary | ICD-10-CM | POA: Insufficient documentation

## 2017-08-27 DIAGNOSIS — F32A Depression, unspecified: Secondary | ICD-10-CM | POA: Diagnosis present

## 2017-08-27 DIAGNOSIS — Z96641 Presence of right artificial hip joint: Secondary | ICD-10-CM | POA: Diagnosis not present

## 2017-08-27 DIAGNOSIS — M797 Fibromyalgia: Secondary | ICD-10-CM | POA: Diagnosis present

## 2017-08-27 DIAGNOSIS — E440 Moderate protein-calorie malnutrition: Secondary | ICD-10-CM | POA: Diagnosis present

## 2017-08-27 HISTORY — PX: TOTAL HIP ARTHROPLASTY: SHX124

## 2017-08-27 LAB — CBC WITH DIFFERENTIAL/PLATELET
Basophils Absolute: 0 10*3/uL (ref 0.0–0.1)
Basophils Relative: 0 %
EOS ABS: 0.1 10*3/uL (ref 0.0–0.7)
EOS PCT: 1 %
HEMATOCRIT: 35.3 % — AB (ref 36.0–46.0)
HEMOGLOBIN: 11.4 g/dL — AB (ref 12.0–15.0)
LYMPHS ABS: 4.6 10*3/uL — AB (ref 0.7–4.0)
Lymphocytes Relative: 44 %
MCH: 27.7 pg (ref 26.0–34.0)
MCHC: 32.3 g/dL (ref 30.0–36.0)
MCV: 85.7 fL (ref 78.0–100.0)
MONOS PCT: 8 %
Monocytes Absolute: 0.9 10*3/uL (ref 0.1–1.0)
Neutro Abs: 4.9 10*3/uL (ref 1.7–7.7)
Neutrophils Relative %: 47 %
Platelets: 68 10*3/uL — ABNORMAL LOW (ref 150–400)
RBC: 4.12 MIL/uL (ref 3.87–5.11)
RDW: 15.7 % — ABNORMAL HIGH (ref 11.5–15.5)
WBC: 10.5 10*3/uL (ref 4.0–10.5)

## 2017-08-27 LAB — SURGICAL PCR SCREEN
MRSA, PCR: NEGATIVE
STAPHYLOCOCCUS AUREUS: NEGATIVE

## 2017-08-27 LAB — PROTIME-INR
INR: 0.97
PROTHROMBIN TIME: 12.8 s (ref 11.4–15.2)

## 2017-08-27 LAB — LACTATE DEHYDROGENASE: LDH: 291 U/L — ABNORMAL HIGH (ref 98–192)

## 2017-08-27 LAB — GLUCOSE, CAPILLARY
GLUCOSE-CAPILLARY: 129 mg/dL — AB (ref 65–99)
GLUCOSE-CAPILLARY: 129 mg/dL — AB (ref 65–99)
Glucose-Capillary: 135 mg/dL — ABNORMAL HIGH (ref 65–99)

## 2017-08-27 LAB — APTT: aPTT: 32 seconds (ref 24–36)

## 2017-08-27 LAB — ABO/RH: ABO/RH(D): B POS

## 2017-08-27 LAB — CORTISOL-AM, BLOOD: CORTISOL - AM: 2.7 ug/dL — AB (ref 6.7–22.6)

## 2017-08-27 SURGERY — ARTHROPLASTY, HIP, TOTAL, ANTERIOR APPROACH
Anesthesia: General | Site: Hip | Laterality: Right

## 2017-08-27 MED ORDER — HYDRALAZINE HCL 20 MG/ML IJ SOLN: 5.0000 mg | INTRAMUSCULAR | Status: DC | PRN

## 2017-08-27 MED ORDER — DIPHENHYDRAMINE HCL 25 MG PO CAPS
25.0000 mg | ORAL_CAPSULE | Freq: Three times a day (TID) | ORAL | Status: DC | PRN
Start: 2017-08-27 — End: 2017-08-31
  Administered 2017-08-27: 25 mg via ORAL
  Filled 2017-08-27: qty 1

## 2017-08-27 MED ORDER — HYDROMORPHONE HCL 1 MG/ML IJ SOLN: 0.5000 mg | INTRAMUSCULAR | Status: DC | PRN

## 2017-08-27 MED ORDER — PROPOFOL 10 MG/ML IV BOLUS
INTRAVENOUS | Status: DC | PRN
  Administered 2017-08-27: 60 mg via INTRAVENOUS

## 2017-08-27 MED ORDER — SENNA 8.6 MG PO TABS: 1.0000 | ORAL_TABLET | Freq: Every day | ORAL | Status: DC | PRN

## 2017-08-27 MED ORDER — LACTATED RINGERS IV SOLN: INTRAVENOUS | Status: DC

## 2017-08-27 MED ORDER — SODIUM CHLORIDE 0.9 % IV SOLN
INTRAVENOUS | Status: DC
  Administered 2017-08-27: 02:00:00 via INTRAVENOUS

## 2017-08-27 MED ORDER — FENTANYL CITRATE (PF) 250 MCG/5ML IJ SOLN
INTRAMUSCULAR | Status: AC
  Filled 2017-08-27: qty 5

## 2017-08-27 MED ORDER — OXYCODONE HCL 5 MG PO TABS
5.0000 mg | ORAL_TABLET | ORAL | Status: DC | PRN
  Administered 2017-08-28 – 2017-08-30 (×7): 5 mg via ORAL
  Filled 2017-08-27 (×7): qty 1

## 2017-08-27 MED ORDER — 0.9 % SODIUM CHLORIDE (POUR BTL) OPTIME
TOPICAL | Status: DC | PRN
  Administered 2017-08-27: 1000 mL

## 2017-08-27 MED ORDER — CEFAZOLIN SODIUM-DEXTROSE 1-4 GM/50ML-% IV SOLN
1.0000 g | Freq: Four times a day (QID) | INTRAVENOUS | Status: AC
  Administered 2017-08-28: 1 g via INTRAVENOUS
  Filled 2017-08-27 (×3): qty 50

## 2017-08-27 MED ORDER — COLLAGENASE 250 UNIT/GM EX OINT: TOPICAL_OINTMENT | Freq: Every day | CUTANEOUS | Status: DC

## 2017-08-27 MED ORDER — HYDROCERIN EX CREA
1.0000 "application " | TOPICAL_CREAM | Freq: Every day | CUTANEOUS | Status: DC
  Administered 2017-08-29 – 2017-08-31 (×3): 1 via TOPICAL
  Filled 2017-08-27: qty 113

## 2017-08-27 MED ORDER — ASPIRIN EC 81 MG PO TBEC
81.0000 mg | DELAYED_RELEASE_TABLET | Freq: Every day | ORAL | Status: DC
  Administered 2017-08-27: 81 mg via ORAL
  Filled 2017-08-27: qty 1

## 2017-08-27 MED ORDER — OXYCODONE HCL 5 MG PO TABS: 5.0000 mg | ORAL_TABLET | ORAL | Status: DC | PRN

## 2017-08-27 MED ORDER — ONDANSETRON HCL 4 MG/2ML IJ SOLN
INTRAMUSCULAR | Status: DC | PRN
  Administered 2017-08-27: 4 mg via INTRAVENOUS

## 2017-08-27 MED ORDER — ONDANSETRON HCL 4 MG/2ML IJ SOLN
INTRAMUSCULAR | Status: DC | PRN
  Administered 2017-08-27: 5 mg via INTRAVENOUS

## 2017-08-27 MED ORDER — METOPROLOL TARTRATE 25 MG PO TABS
25.0000 mg | ORAL_TABLET | Freq: Two times a day (BID) | ORAL | Status: DC
  Administered 2017-08-27 – 2017-08-31 (×10): 25 mg via ORAL
  Filled 2017-08-27 (×2): qty 1
  Filled 2017-08-27: qty 2
  Filled 2017-08-27 (×7): qty 1

## 2017-08-27 MED ORDER — FENTANYL CITRATE (PF) 100 MCG/2ML IJ SOLN: 25.0000 ug | INTRAMUSCULAR | Status: DC | PRN

## 2017-08-27 MED ORDER — ZOLPIDEM TARTRATE 5 MG PO TABS: 5.0000 mg | ORAL_TABLET | Freq: Every evening | ORAL | Status: DC | PRN

## 2017-08-27 MED ORDER — OMEGA-3-ACID ETHYL ESTERS 1 G PO CAPS
1.0000 g | ORAL_CAPSULE | Freq: Every day | ORAL | Status: DC
  Administered 2017-08-28 – 2017-08-31 (×4): 1 g via ORAL
  Filled 2017-08-27 (×5): qty 1

## 2017-08-27 MED ORDER — SODIUM CHLORIDE 0.9 % IV SOLN: INTRAVENOUS | Status: DC

## 2017-08-27 MED ORDER — RISPERIDONE 0.5 MG PO TABS
0.5000 mg | ORAL_TABLET | Freq: Every day | ORAL | Status: DC
  Administered 2017-08-27 – 2017-08-30 (×4): 0.5 mg via ORAL
  Filled 2017-08-27 (×4): qty 1

## 2017-08-27 MED ORDER — LIDOCAINE HCL (CARDIAC) 20 MG/ML IV SOLN
INTRAVENOUS | Status: DC | PRN
  Administered 2017-08-27: 60 mg via INTRAVENOUS

## 2017-08-27 MED ORDER — ALBUTEROL SULFATE (2.5 MG/3ML) 0.083% IN NEBU: 2.5000 mg | INHALATION_SOLUTION | RESPIRATORY_TRACT | Status: DC | PRN

## 2017-08-27 MED ORDER — ACETAMINOPHEN 650 MG RE SUPP: 650.0000 mg | RECTAL | Status: DC | PRN

## 2017-08-27 MED ORDER — ACETAMINOPHEN 325 MG PO TABS
650.0000 mg | ORAL_TABLET | ORAL | Status: DC | PRN
  Administered 2017-08-27 – 2017-08-29 (×3): 650 mg via ORAL
  Filled 2017-08-27 (×3): qty 2

## 2017-08-27 MED ORDER — FENTANYL CITRATE (PF) 100 MCG/2ML IJ SOLN
INTRAMUSCULAR | Status: DC | PRN
  Administered 2017-08-27: 50 ug via INTRAVENOUS
  Administered 2017-08-27: 100 ug via INTRAVENOUS
  Administered 2017-08-27 (×2): 50 ug via INTRAVENOUS

## 2017-08-27 MED ORDER — LACTATED RINGERS IV SOLN
INTRAVENOUS | Status: DC | PRN
  Administered 2017-08-27: 10:00:00 via INTRAVENOUS

## 2017-08-27 MED ORDER — PHENYLEPHRINE HCL 10 MG/ML IJ SOLN
INTRAMUSCULAR | Status: DC | PRN
  Administered 2017-08-27 (×2): 80 ug via INTRAVENOUS

## 2017-08-27 MED ORDER — VITAMIN D 1000 UNITS PO TABS
1000.0000 [IU] | ORAL_TABLET | Freq: Every day | ORAL | Status: DC
  Administered 2017-08-28 – 2017-08-31 (×4): 1000 [IU] via ORAL
  Filled 2017-08-27 (×5): qty 1

## 2017-08-27 MED ORDER — DULOXETINE HCL 30 MG PO CPEP
30.0000 mg | ORAL_CAPSULE | Freq: Every day | ORAL | Status: DC
  Administered 2017-08-27: 30 mg via ORAL
  Filled 2017-08-27: qty 1

## 2017-08-27 MED ORDER — METOCLOPRAMIDE HCL 5 MG PO TABS: 5.0000 mg | ORAL_TABLET | Freq: Three times a day (TID) | ORAL | Status: DC | PRN

## 2017-08-27 MED ORDER — SUGAMMADEX SODIUM 200 MG/2ML IV SOLN
INTRAVENOUS | Status: DC | PRN
  Administered 2017-08-27: 104 mg via INTRAVENOUS

## 2017-08-27 MED ORDER — CEFAZOLIN SODIUM-DEXTROSE 2-4 GM/100ML-% IV SOLN
INTRAVENOUS | Status: AC
  Filled 2017-08-27: qty 100

## 2017-08-27 MED ORDER — PHENYLEPHRINE HCL 10 MG/ML IJ SOLN
INTRAVENOUS | Status: DC | PRN
  Administered 2017-08-27: 15 ug/min via INTRAVENOUS

## 2017-08-27 MED ORDER — MIDAZOLAM HCL 2 MG/2ML IJ SOLN
INTRAMUSCULAR | Status: AC
  Filled 2017-08-27: qty 2

## 2017-08-27 MED ORDER — SODIUM CHLORIDE 0.9 % IR SOLN
Status: DC | PRN
  Administered 2017-08-27: 3000 mL

## 2017-08-27 MED ORDER — LINEZOLID 600 MG PO TABS
600.0000 mg | ORAL_TABLET | Freq: Two times a day (BID) | ORAL | Status: DC
  Administered 2017-08-27 – 2017-08-29 (×4): 600 mg via ORAL
  Filled 2017-08-27 (×5): qty 1

## 2017-08-27 MED ORDER — HYDROCODONE-ACETAMINOPHEN 5-325 MG PO TABS
1.0000 | ORAL_TABLET | ORAL | Status: DC | PRN
  Administered 2017-08-27 – 2017-08-30 (×4): 1 via ORAL
  Filled 2017-08-27 (×4): qty 1

## 2017-08-27 MED ORDER — PHENOL 1.4 % MT LIQD: 1.0000 | OROMUCOSAL | Status: DC | PRN

## 2017-08-27 MED ORDER — METHOCARBAMOL 500 MG PO TABS
500.0000 mg | ORAL_TABLET | Freq: Three times a day (TID) | ORAL | Status: DC | PRN
  Administered 2017-08-27 – 2017-08-30 (×4): 500 mg via ORAL
  Filled 2017-08-27 (×4): qty 1

## 2017-08-27 MED ORDER — AMLODIPINE BESYLATE 5 MG PO TABS
5.0000 mg | ORAL_TABLET | Freq: Every day | ORAL | Status: DC
  Administered 2017-08-27 – 2017-08-31 (×4): 5 mg via ORAL
  Filled 2017-08-27 (×5): qty 1

## 2017-08-27 MED ORDER — ONDANSETRON HCL 4 MG/2ML IJ SOLN: 4.0000 mg | Freq: Four times a day (QID) | INTRAMUSCULAR | Status: DC | PRN

## 2017-08-27 MED ORDER — TACROLIMUS 1 MG PO CAPS: 2.0000 mg | ORAL_CAPSULE | Freq: Two times a day (BID) | ORAL | Status: DC

## 2017-08-27 MED ORDER — HYDROMORPHONE HCL 1 MG/ML IJ SOLN
1.0000 mg | INTRAMUSCULAR | Status: DC | PRN
  Administered 2017-08-27 (×2): 1 mg via INTRAVENOUS
  Filled 2017-08-27 (×2): qty 1

## 2017-08-27 MED ORDER — CEFAZOLIN SODIUM-DEXTROSE 2-3 GM-%(50ML) IV SOLR
INTRAVENOUS | Status: DC | PRN
  Administered 2017-08-27: 2 g via INTRAVENOUS

## 2017-08-27 MED ORDER — HYDROMORPHONE HCL 1 MG/ML IJ SOLN
1.0000 mg | Freq: Once | INTRAMUSCULAR | Status: AC
  Administered 2017-08-27: 1 mg via INTRAVENOUS
  Filled 2017-08-27: qty 1

## 2017-08-27 MED ORDER — ONDANSETRON HCL 4 MG/2ML IJ SOLN: 4.0000 mg | Freq: Three times a day (TID) | INTRAMUSCULAR | Status: DC | PRN

## 2017-08-27 MED ORDER — MELATONIN 3 MG PO TABS
3.0000 mg | ORAL_TABLET | Freq: Every day | ORAL | Status: DC
  Administered 2017-08-27 – 2017-08-30 (×4): 3 mg via ORAL
  Filled 2017-08-27 (×4): qty 1

## 2017-08-27 MED ORDER — METOCLOPRAMIDE HCL 5 MG/ML IJ SOLN: 5.0000 mg | Freq: Three times a day (TID) | INTRAMUSCULAR | Status: DC | PRN

## 2017-08-27 MED ORDER — FAMOTIDINE 20 MG PO TABS
20.0000 mg | ORAL_TABLET | Freq: Two times a day (BID) | ORAL | Status: DC
  Administered 2017-08-27 – 2017-08-31 (×10): 20 mg via ORAL
  Filled 2017-08-27 (×10): qty 1

## 2017-08-27 MED ORDER — ONDANSETRON HCL 4 MG PO TABS: 4.0000 mg | ORAL_TABLET | Freq: Four times a day (QID) | ORAL | Status: DC | PRN

## 2017-08-27 MED ORDER — SENNOSIDES-DOCUSATE SODIUM 8.6-50 MG PO TABS
1.0000 | ORAL_TABLET | Freq: Every day | ORAL | Status: DC
  Administered 2017-08-27 – 2017-08-30 (×5): 1 via ORAL
  Filled 2017-08-27 (×5): qty 1

## 2017-08-27 MED ORDER — PROPOFOL 10 MG/ML IV BOLUS
INTRAVENOUS | Status: AC
  Filled 2017-08-27: qty 20

## 2017-08-27 MED ORDER — ADULT MULTIVITAMIN W/MINERALS CH
1.0000 | ORAL_TABLET | Freq: Every day | ORAL | Status: DC
  Administered 2017-08-27 – 2017-08-31 (×5): 1 via ORAL
  Filled 2017-08-27 (×5): qty 1

## 2017-08-27 MED ORDER — PREDNISONE 5 MG PO TABS
5.0000 mg | ORAL_TABLET | Freq: Every day | ORAL | Status: DC
  Administered 2017-08-27 – 2017-08-31 (×5): 5 mg via ORAL
  Filled 2017-08-27 (×5): qty 1

## 2017-08-27 MED ORDER — ROCURONIUM BROMIDE 100 MG/10ML IV SOLN
INTRAVENOUS | Status: DC | PRN
  Administered 2017-08-27: 40 mg via INTRAVENOUS

## 2017-08-27 MED ORDER — ASPIRIN 81 MG PO CHEW
81.0000 mg | CHEWABLE_TABLET | Freq: Two times a day (BID) | ORAL | Status: DC
  Administered 2017-08-27 – 2017-08-31 (×8): 81 mg via ORAL
  Filled 2017-08-27 (×8): qty 1

## 2017-08-27 MED ORDER — METOPROLOL TARTRATE 25 MG PO TABS
25.0000 mg | ORAL_TABLET | Freq: Once | ORAL | Status: AC
  Administered 2017-08-27: 25 mg via ORAL
  Filled 2017-08-27 (×2): qty 1

## 2017-08-27 MED ORDER — MENTHOL 3 MG MT LOZG: 1.0000 | LOZENGE | OROMUCOSAL | Status: DC | PRN

## 2017-08-27 MED ORDER — INFLUENZA VAC SPLIT QUAD 0.5 ML IM SUSY
0.5000 mL | PREFILLED_SYRINGE | INTRAMUSCULAR | Status: AC | PRN
  Administered 2017-08-31: 0.5 mL via INTRAMUSCULAR
  Filled 2017-08-27: qty 0.5

## 2017-08-27 MED ORDER — HYDROCORTISONE NA SUCCINATE PF 100 MG IJ SOLR
50.0000 mg | Freq: Once | INTRAMUSCULAR | Status: AC
  Administered 2017-08-27: 50 mg via INTRAVENOUS
  Filled 2017-08-27: qty 1

## 2017-08-27 SURGICAL SUPPLY — 54 items
APL SKNCLS STERI-STRIP NONHPOA (GAUZE/BANDAGES/DRESSINGS) ×1
BENZOIN TINCTURE PRP APPL 2/3 (GAUZE/BANDAGES/DRESSINGS) ×2 IMPLANT
BLADE CLIPPER SURG (BLADE) IMPLANT
BLADE SAW SGTL 18X1.27X75 (BLADE) ×2 IMPLANT
CAPT HIP TOTAL 2 ×1 IMPLANT
CELLS DAT CNTRL 66122 CELL SVR (MISCELLANEOUS) ×1 IMPLANT
COVER SURGICAL LIGHT HANDLE (MISCELLANEOUS) ×2 IMPLANT
DRAPE C-ARM 42X72 X-RAY (DRAPES) ×2 IMPLANT
DRAPE STERI IOBAN 125X83 (DRAPES) ×2 IMPLANT
DRAPE U-SHAPE 47X51 STRL (DRAPES) ×6 IMPLANT
DRSG AQUACEL AG ADV 3.5X10 (GAUZE/BANDAGES/DRESSINGS) ×2 IMPLANT
DURAPREP 26ML APPLICATOR (WOUND CARE) ×2 IMPLANT
ELECT BLADE 4.0 EZ CLEAN MEGAD (MISCELLANEOUS) ×2
ELECT BLADE 6.5 EXT (BLADE) ×1 IMPLANT
ELECT REM PT RETURN 9FT ADLT (ELECTROSURGICAL) ×2
ELECTRODE BLDE 4.0 EZ CLN MEGD (MISCELLANEOUS) ×1 IMPLANT
ELECTRODE REM PT RTRN 9FT ADLT (ELECTROSURGICAL) ×1 IMPLANT
FACESHIELD WRAPAROUND (MASK) ×4 IMPLANT
FACESHIELD WRAPAROUND OR TEAM (MASK) ×2 IMPLANT
GAUZE XEROFORM 5X9 LF (GAUZE/BANDAGES/DRESSINGS) ×1 IMPLANT
GLOVE BIOGEL PI IND STRL 8 (GLOVE) ×2 IMPLANT
GLOVE BIOGEL PI INDICATOR 8 (GLOVE) ×2
GLOVE ECLIPSE 8.0 STRL XLNG CF (GLOVE) ×2 IMPLANT
GLOVE ORTHO TXT STRL SZ7.5 (GLOVE) ×4 IMPLANT
GOWN STRL REUS W/ TWL LRG LVL3 (GOWN DISPOSABLE) ×2 IMPLANT
GOWN STRL REUS W/ TWL XL LVL3 (GOWN DISPOSABLE) ×2 IMPLANT
GOWN STRL REUS W/TWL LRG LVL3 (GOWN DISPOSABLE) ×4
GOWN STRL REUS W/TWL XL LVL3 (GOWN DISPOSABLE) ×4
HANDPIECE INTERPULSE COAX TIP (DISPOSABLE) ×2
KIT BASIN OR (CUSTOM PROCEDURE TRAY) ×2 IMPLANT
KIT ROOM TURNOVER OR (KITS) ×2 IMPLANT
MANIFOLD NEPTUNE II (INSTRUMENTS) ×2 IMPLANT
NS IRRIG 1000ML POUR BTL (IV SOLUTION) ×2 IMPLANT
PACK TOTAL JOINT (CUSTOM PROCEDURE TRAY) ×2 IMPLANT
PACK UNIVERSAL I (CUSTOM PROCEDURE TRAY) ×1 IMPLANT
PAD ARMBOARD 7.5X6 YLW CONV (MISCELLANEOUS) ×2 IMPLANT
RETRACTOR WND ALEXIS 18 MED (MISCELLANEOUS) ×1 IMPLANT
RTRCTR WOUND ALEXIS 18CM MED (MISCELLANEOUS) ×2
SET HNDPC FAN SPRY TIP SCT (DISPOSABLE) ×1 IMPLANT
STAPLER VISISTAT 35W (STAPLE) ×1 IMPLANT
STRIP CLOSURE SKIN 1/2X4 (GAUZE/BANDAGES/DRESSINGS) ×4 IMPLANT
SUT ETHIBOND NAB CT1 #1 30IN (SUTURE) ×4 IMPLANT
SUT MNCRL AB 4-0 PS2 18 (SUTURE) ×1 IMPLANT
SUT VIC AB 0 CT1 27 (SUTURE) ×4
SUT VIC AB 0 CT1 27XBRD ANBCTR (SUTURE) ×1 IMPLANT
SUT VIC AB 1 CT1 27 (SUTURE) ×2
SUT VIC AB 1 CT1 27XBRD ANBCTR (SUTURE) ×1 IMPLANT
SUT VIC AB 2-0 CT1 27 (SUTURE) ×2
SUT VIC AB 2-0 CT1 TAPERPNT 27 (SUTURE) ×1 IMPLANT
TOWEL OR 17X24 6PK STRL BLUE (TOWEL DISPOSABLE) ×2 IMPLANT
TOWEL OR 17X26 10 PK STRL BLUE (TOWEL DISPOSABLE) ×2 IMPLANT
TRAY CATH 16FR W/PLASTIC CATH (SET/KITS/TRAYS/PACK) IMPLANT
TRAY FOLEY W/METER SILVER 16FR (SET/KITS/TRAYS/PACK) IMPLANT
WATER STERILE IRR 1000ML POUR (IV SOLUTION) ×4 IMPLANT

## 2017-08-27 NOTE — Brief Op Note (Signed)
08/26/2017 - 08/27/2017  11:18 AM  PATIENT:  Allison Beasley  56 y.o. female  PRE-OPERATIVE DIAGNOSIS:  hip fracture  POST-OPERATIVE DIAGNOSIS:  hip fracture  PROCEDURE:  Procedure(s): TOTAL HIP ARTHROPLASTY ANTERIOR APPROACH (Right)  SURGEON:  Surgeon(s) and Role:    [name], MD - Primary  PHYSICIAN ASSISTANT: Rexene Edison, PA-C  ANESTHESIA:   general  EBL:  200 mL   COUNTS:  YES  DICTATION: .Other Dictation: Dictation Number [id number]  PLAN OF CARE: Admit to inpatient   PATIENT DISPOSITION:  PACU - hemodynamically stable.   Delay start of Pharmacological VTE agent (>24hrs) due to surgical blood loss or risk of bleeding: no

## 2017-08-27 NOTE — Progress Notes (Signed)
Patient ID: Allison Beasley, female   DOB: 05/24/1961, 56 y.o.   MRN: [id number] She also does have a right shoulder AC separation and a tiny distal clavicle fracture that can be treated conservatively/non-op with activities as tolerated.

## 2017-08-27 NOTE — Anesthesia Preprocedure Evaluation (Signed)
Anesthesia Evaluation  Patient identified by MRN, date of birth, ID band Patient awake    Reviewed: Allergy & Precautions, NPO status , Patient's Chart, lab work & pertinent test results, reviewed documented beta blocker date and time   Airway Mallampati: II  TM Distance: >3 FB Neck ROM: Full    Dental  (+) Teeth Intact,    Pulmonary shortness of breath, neg sleep apnea, neg COPD, neg recent URI,    breath sounds clear to auscultation       Cardiovascular hypertension, Pt. on medications and Pt. 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, Prednisone 5mg  daily, cortisol low, stress dosed on admission  Renal/GU      Musculoskeletal  (+) Arthritis , Fibromyalgia -  Abdominal   Peds  Hematology  (+) anemia , Thrombocytopenia 67, Discussed with Dr   Anesthesia Other Findings   Reproductive/Obstetrics                             Anesthesia Physical Anesthesia Plan  ASA: III  Anesthesia Plan: General   Post-op Pain Management:    Induction: Intravenous  PONV Risk Score and Plan: 3 and Ondansetron, Dexamethasone and Treatment may vary due to age or medical condition  Airway Management Planned: Oral ETT  Additional Equipment: None  Intra-op Plan:   Post-operative Plan: Extubation in OR  Informed Consent: I have reviewed the patients History and Physical, chart, labs and discussed the procedure including the risks, benefits and alternatives for the proposed anesthesia with the patient or authorized representative who has indicated his/her understanding and acceptance.   Dental advisory given  Plan Discussed with: CRNA and Surgeon  Anesthesia Plan  Comments:         Anesthesia Quick Evaluation

## 2017-08-27 NOTE — Progress Notes (Signed)
PT Cancellation Note  Patient Details Name: Allison Beasley MRN: [id number] DOB: 1961-04-27   Cancelled Treatment:    Reason Eval/Treat Not Completed: Other (comment)   Noted plans for surgery today; Will follow up for PT eval postop;   Thank you,  Van Clines, PT  Acute Rehabilitation Services Pager [phone] Office [phone]    Levi Aland 08/27/2017, 8:06 AM

## 2017-08-27 NOTE — ED Provider Notes (Signed)
Patient presented to the ER after a fall with complaints of pain in the right shoulder and right hip.  Face to face Exam: HEENT - PERRLA Lungs - CTAB Heart - RRR, no M/R/G Abd - S/NT/ND Neuro - alert, oriented x3 Musculoskeletal -tenderness in the area of the right clavicle, tenderness with decreased range of motion the right hip  Plan: Patient with with right hip fracture, will admit for surgical intervention   Gilda Crease, MD 08/27/17 0131

## 2017-08-27 NOTE — Transfer of Care (Signed)
Immediate Anesthesia Transfer of Care Note  Patient: Allison Beasley  Procedure(s) Performed: TOTAL HIP ARTHROPLASTY ANTERIOR APPROACH (Right Hip)  Patient Location: PACU  Anesthesia Type:General  Level of Consciousness: awake, alert  and oriented  Airway & Oxygen Therapy: Patient Spontanous Breathing and Patient connected to nasal cannula oxygen  Post-op Assessment: Report given to RN and Post -op Vital signs reviewed and stable  Post vital signs: Reviewed and stable  Last Vitals:  Vitals:   08/27/17 0145 08/27/17 0218  BP: 135/88 (!) 157/86  Pulse:  (!) 101  Resp: 13   Temp:  37.3 C  SpO2:      Last Pain:  Vitals:   08/27/17 0321  TempSrc:   PainSc: 4       Patients Stated Pain Goal: 1 (08/27/17 0218)  Complications: No apparent anesthesia complications

## 2017-08-27 NOTE — Anesthesia Procedure Notes (Signed)
Procedure Name: Intubation Date/Time: 08/27/2017 9:58 AM Performed by: Clearnce Sorrel, CRNA Pre-anesthesia Checklist: Patient identified, Emergency Drugs available, Suction available, Patient being monitored and Timeout performed Patient Re-evaluated:Patient Re-evaluated prior to induction Oxygen Delivery Method: Circle system utilized Preoxygenation: Pre-oxygenation with 100% oxygen Induction Type: IV induction Ventilation: Mask ventilation without difficulty Laryngoscope Size: Mac and 3 Grade View: Grade I Tube type: Oral Tube size: 7.0 mm Number of attempts: 1 Airway Equipment and Method: Stylet Placement Confirmation: ETT inserted through vocal cords under direct vision,  positive ETCO2 and breath sounds checked- equal and bilateral Secured at: 22 cm Tube secured with: Tape Dental Injury: Teeth and Oropharynx as per pre-operative assessment

## 2017-08-27 NOTE — Progress Notes (Signed)
Orthopedic Tech Progress Note Patient Details:  Allison Beasley [birth year] [id number]  Ortho Devices Type of Ortho Device: Arm sling Ortho Device/Splint Location: rue Ortho Device/Splint Interventions: Ordered, Application, Adjustment   Trinna Post 08/27/2017, 5:19 AM

## 2017-08-27 NOTE — Progress Notes (Addendum)
0715 Bedside shift report, pt resting in bed, denies pain at this time. Right arm in sling, ice pack to right hip. NAD, no complaints at this time.   0800 OR called, pt going to sx now. Consents signed, pt updated and assessed, BS 129. Dgt at bedside updated. OR tech here to p/u pt, down via bed with dgt.   1300 pt back from sx via bed, A&Ox4, family at bedside, dsg to right anterior hip assessed, CDI, VSS, WCTM.  1500 Pt resting comfortably, family at bedside, NAD.   1830 Pt still resting comfortably, NAD, updated with POC, awaiting to give shift report to oncoming RN.

## 2017-08-27 NOTE — Op Note (Signed)
NAME:  CALLAN, YONTZ NO.:  [id number]  MEDICAL RECORD NO.:  [id number]  LOCATION:  A07C                         FACILITY:  [facility]  PHYSICIAN:  Vanita Panda. Magnus Ivan, M.D.DATE OF BIRTH:  02-02-1961  DATE OF PROCEDURE:  08/27/2017 DATE OF DISCHARGE:                              OPERATIVE REPORT   PREOPERATIVE DIAGNOSIS:  Displaced right hip femoral neck fracture.  POSTOPERATIVE DIAGNOSIS:  Displaced right hip femoral neck fracture.  PROCEDURE:  Right total hip arthroplasty through direct anterior approach.  IMPLANTS:  DePuy Sector Gription acetabular component size 50 with a single screw, size 32 +0 polyethylene liner, size 11 Corail femoral component with standard offset, size 32 +1 ceramic hip ball.  SURGEON:  Vanita Panda. Magnus Ivan, MD.  ASSISTANT:  Richardean Canal, PA-C.  ANESTHESIA:  General.  ANTIBIOTICS:  IV Ancef 2 g.  BLOOD LOSS:  300 mL.  COMPLICATIONS:  None.  INDICATIONS:  Ms. Kimbrough is a 56 year old female with mainly hypertension, but also calcific deposits all over her body, who sustained an accidental mechanical fall yesterday at her home.  She was transferred to the [facility] and found to have a displaced right hip femoral neck fracture.  She is now presenting for definitive treatment of this fracture.  We have recommended a total hip arthroplasty given her young age of 15.  I had a long and thorough discussion with her and her daughter about the risks and benefits of this surgery and given the severity of her pain and disability from this, they understand the recommendation of surgery and do wish to proceed.  They understand the risks of acute blood loss anemia, nerve and vessel injury, fracture, infection, dislocation, DVT, and understands our goals are to decrease pain, improve mobility, and overall improved quality of life.  PROCEDURE DESCRIPTION:  After informed consent was obtained, appropriate right hip was marked.   She was brought to the operating room, where general anesthesia was obtained while she was on her stretcher.  There was already a Foley catheter in place.  Both feet had traction boots applied to them.  She was placed supine on the Hana fracture table with the perineal post in place and both legs in InLine skeletal traction devices, but no traction applied.  Her right operative hip was prepped and draped with DuraPrep and sterile drapes.  A time-out was called.  She was identified as correct patient and correct right hip.  I then made an incision just inferior and posterior to the anterior superior iliac spine and carried this down through the soft tissues directly to the tensor fascia.  There was a lot of calcium and cortical deposits all in the soft tissue.  I was able to dissect down the tensor fascia lata and the tensor fascia was then divided longitudinally to proceed with a direct anterior approach to the hip.  We identified and cauterized circumflex vessels and identified the hip capsule.  I opened the hip capsule in a L-type format finding a hematoma consistent with a femoral neck fracture and you could see the femoral neck fracture easily once we opened up the hip capsule.  We placed Cobra retractors around  the medial and lateral remnants femoral neck and made our femoral neck cut proximal to the lesser trochanter with an oscillating saw and completed this on osteotome.  I placed a corkscrew guide in the femoral head and removed the femoral head in its entirety and passed this off the back table.  We then placed a bent Hohmann over the medial acetabular rim and cleaned remnants of acetabular labrum and other debris from the labrum.  I then began reaming under direct visualization from a size 43 reamer in stepwise increments up to a size 50 with all reamers under direct visualization the last reamer under direct fluoroscopy, so I could obtain my depth of reaming, our inclination,  and anteversion.  Once I was pleased with this, I placed the real DePuy Sector Gription acetabular component size 50 and a 32+ 0 neutral polyethylene liner for that size, 50 acetabular component.  I did place a single screw as well. Attention was then turned to the femur.  With the leg externally rotated to 120 degrees extended and adducted, we were able to place a Mueller retractor medially and a Hohmann retractor behind the greater trochanter.  I released the lateral joint capsule and used a box cutting osteotome on the inner femoral canal and a rongeur to lateralize.  I then began broaching from a size 8 broach using the Corail broaching system going up to a size 11.  With the size 11 in place, we trialed a standard offset femoral neck and a 32 +1 hip ball.  We brought the leg back over and up with traction and rotation reducing the pelvis, and we were pleased with leg length, offset and range of motion and stability. I then dislocated the hip and removed the trial components.  We were able to place the real Corail femoral component with standard offset, size 11 and the real 32 +1 ceramic hip ball, and again reducing the acetabulum and we were pleased with stability.  We verified the placement all under direct fluoroscopy as well.  We then irrigated the soft tissue with normal saline solution using pulsatile lavage.  We closed the joint capsule with interrupted #1 Ethibond suture, followed by running #1 Vicryl in tensor fascia, 0 Vicryl in the deep tissue, 2-0 Vicryl in the subcutaneous tissue, interrupted staples on the skin. Xeroform and Aquacel dressing were applied.  She was taken off the Hana table, awakened, extubated, and taken to the recovery room in stable condition.  All final counts were correct.  There were no complications noted.  Of note, Kae Heller, PA-C, assisted in the entire case and his assistance was crucial for facilitating all aspects of this  case.     Vanita Panda. Magnus Ivan, M.D.     CYB/MEDQ  D:  08/27/2017  T:  08/27/2017  Job:  [id number]

## 2017-08-27 NOTE — Progress Notes (Signed)
OT Cancellation    08/27/17 0800  OT Visit Information  Last OT Received On 08/27/17  Reason Eval/Treat Not Completed Patient at procedure or test/ unavailable (Pt off the floor for surgery. Will return for OT eval post-op and as schedule allows.)   Curlene Dolphin MSOT, OTR/L Acute Rehab Pager: [phone] Office: [phone]

## 2017-08-27 NOTE — Consult Note (Signed)
Reason for Consult:  Right hip fracture Referring Physician: Betsey Holiday, MD/EDP  Allison Beasley is an 56 y.o. female.  HPI:   56 yo female who sustained an accidental mechanical fall last evening injuring her right hip.  She was brought to the ED and found to have a right hip displaced femoral neck fracture.  She denies other injuries.  Ortho was consulted for treatment recommendations.  She does report significant right hip pain.  Past Medical History:  Diagnosis Date  . Anemia   . Anxiety   . Constipation   . Depression   . Fibromyalgia   . GERD (gastroesophageal reflux disease)   . H. influenzae infection 2018  . Headache(784.0)   . Hypertension   . Interstitial pulmonary disease, unspecified ([place]) 2018  . Open wound of left buttock 56/2018  . Osteoarthritis   . Osteopenia   . PAD (peripheral artery disease) ([place])   . Peripheral vascular disease ([place])   . Pneumonia 03/2012  . Polymyositis ([place])   . Pressure ulcer of buttock 02/2017   right Buttock-   . Pressure ulcer- Left Heel 2018   unstageable   . Raynaud phenomenon   . Rheumatoid arthritis ([place])   . Seizures ([place])    with chid birth 58f2 childre  . Sepsis ([place] 2018  . Shortness of breath    with exertion  . Type II diabetes mellitus ([place]    on no medications    Past Surgical History:  Procedure Laterality Date  . CESAREAN SECTION     x3  . COLONOSCOPY    . HYSTEROSCOPY     w/ D&C with excision of endometrial polyps  . IRRIGATION AND DEBRIDEMENT FOOT Right 10/26/2015  . left great toenail removal  Left 2013   [facility]  . TOE AMPUTATION Left 05/17/2017   5th toe  . TONSILLECTOMY Bilateral   . TUBAL LIGATION      Family History  Problem Relation Age of Onset  . Diabetes Mother   . Hypertension Mother   . Diabetes Father   . Heart disease Father   . Hypertension Sister   . Heart disease Sister     Social History:  reports that  has never smoked. she has never used smokeless tobacco. She  reports that she does not drink alcohol or use drugs.  Allergies:  Allergies  Allergen Reactions  . Clindamycin/Lincomycin Itching and Rash    Possible reaction (took clindamycin and cipro at the same time)  . Doxycycline Nausea Only  . Percocet [Oxycodone-Acetaminophen] Itching  . Ciprofloxacin Itching and Rash    Possible reaction (took clindamycin and cipro at the same time)   . Mycophenolate Diarrhea    Medications: I have reviewed the patient's current medications.  Results for orders placed or performed during the hospital encounter of 08/26/17 (from the past 48 hour(s))  CBC with Differential/Platelet     Status: Abnormal   Collection Time: 08/26/17 11:00 PM  Result Value Ref Range   WBC 10.5 4.0 - 10.5 K/uL   RBC 4.12 3.87 - 5.11 MIL/uL   Hemoglobin 11.4 (L) 12.0 - 15.0 g/dL   HCT 35.3 (L) 36.0 - 46.0 %   MCV 85.7 78.0 - 100.0 fL   MCH 27.7 26.0 - 34.0 pg   MCHC 32.3 30.0 - 36.0 g/dL   RDW 15.7 (H) 11.5 - 15.5 %   Platelets 68 (L) 150 - 400 K/uL    Comment: PLATELET COUNT CONFIRMED BY SMEAR  Neutrophils Relative % 47 %   Neutro Abs 4.9 1.7 - 7.7 K/uL   Lymphocytes Relative 44 %   Lymphs Abs 4.6 (H) 0.7 - 4.0 K/uL   Monocytes Relative 8 %   Monocytes Absolute 0.9 0.1 - 1.0 K/uL   Eosinophils Relative 1 %   Eosinophils Absolute 0.1 0.0 - 0.7 K/uL   Basophils Relative 0 %   Basophils Absolute 0.0 0.0 - 0.1 K/uL  Basic metabolic panel     Status: Abnormal   Collection Time: 08/26/17 11:00 PM  Result Value Ref Range   Sodium 131 (L) 135 - 145 mmol/L   Potassium 3.8 3.5 - 5.1 mmol/L   Chloride 101 101 - 111 mmol/L   CO2 20 (L) 22 - 32 mmol/L   Glucose, Bld 108 (H) 65 - 99 mg/dL   BUN 14 6 - 20 mg/dL   Creatinine, Ser 0.85 0.44 - 1.00 mg/dL   Calcium 8.2 (L) 8.9 - 10.3 mg/dL   GFR calc non Af Amer >60 >60 mL/min   GFR calc Af Amer >60 >60 mL/min    Comment: (NOTE) The eGFR has been calculated using the CKD EPI equation. This calculation has not been validated  in all clinical situations. eGFR's persistently <60 mL/min signify possible Chronic Kidney Disease.    Anion gap 10 5 - 15  Lactate dehydrogenase     Status: Abnormal   Collection Time: 08/26/17 11:00 PM  Result Value Ref Range   LDH 291 (H) 98 - 192 U/L  Protime-INR     Status: None   Collection Time: 08/26/17 11:00 PM  Result Value Ref Range   Prothrombin Time 12.8 11.4 - 15.2 seconds   INR 0.97   APTT     Status: None   Collection Time: 08/26/17 11:00 PM  Result Value Ref Range   aPTT 32 24 - 36 seconds  Surgical pcr screen     Status: None   Collection Time: 08/27/17  3:36 AM  Result Value Ref Range   MRSA, PCR NEGATIVE NEGATIVE   Staphylococcus aureus NEGATIVE NEGATIVE    Comment: (NOTE) The Xpert SA Assay (FDA approved for NASAL specimens in patients 54 years of age and older), is one component of a comprehensive surveillance program. It is not intended to diagnose infection nor to guide or monitor treatment.   Type and screen [place]     Status: None   Collection Time: 08/27/17  5:10 AM  Result Value Ref Range   ABO/RH(D) B POS    Antibody Screen NEG    Sample Expiration 08/30/2017   ABO/Rh     Status: None (Preliminary result)   Collection Time: 08/27/17  5:10 AM  Result Value Ref Range   ABO/RH(D) B POS   Cortisol-am, blood     Status: Abnormal   Collection Time: 08/27/17  5:18 AM  Result Value Ref Range   Cortisol - AM 2.7 (L) 6.7 - 22.6 ug/dL    Dg Chest 1 View  Result Date: 08/27/2017 CLINICAL DATA:  [place] downstairs tonight. EXAM: CHEST 1 VIEW COMPARISON:  Chest radiograph February 01, 2017 FINDINGS: Cardiac silhouette is upper limits of normal in size. Calcified aortic knob. Mild chronic interstitial changes without pleural effusion or focal consolidation. LEFT ear and a RIGHT bibasilar suspected fibrosis. Mildly elevated RIGHT hemidiaphragm. No pneumothorax. Osteopenia. Acute RIGHT distal clavicle fracture. Extensive soft tissue  calcifications. IMPRESSION: Borderline cardiomegaly. Mild chronic interstitial changes and bibasilar suspected fibrosis. Acute RIGHT distal clavicle fracture.  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 Electronically Signed   By: Elon Alas M.D.   On: 08/27/2017 00:19   Dg Lumbar Spine Complete  Result Date: 08/27/2017 CLINICAL DATA:  [place] [address] downstairs tonight. EXAM: LUMBAR SPINE - COMPLETE 4+ VIEW COMPARISON:  Abdominal radiograph January 22, 2017 FINDINGS: There is no evidence of lumbar spine fracture. Alignment is normal. Osteopenia. Intervertebral disc spaces are maintained. Extensive soft tissue calcifications, limiting assessment of the lumbosacral junction on frontal radiograph. IMPRESSION: No acute fracture deformity or malalignment. 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 Electronically Signed   By: Elon Alas M.D.   On: 08/27/2017 00:17   Dg Shoulder Right  Result Date: 08/27/2017 CLINICAL DATA:  [place] downstairs tonight. EXAM: RIGHT SHOULDER - 2+ VIEW COMPARISON:  Chest radiograph February 01, 2017 FINDINGS: Distal clavicle now projects above the acromion, with acute suspected distal clavicle fracture. Osteopenia without destructive bony lesions. Humeral head is located. Extensive soft tissue calcifications. IMPRESSION: Probable acute distal clavicle displaced fracture. 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 Electronically Signed   By: Elon Alas M.D.   On: 08/27/2017 00:16   Ct Head Wo Contrast  Result Date: 08/27/2017 CLINICAL DATA:  Fall going up brick steps. EXAM: CT HEAD WITHOUT CONTRAST CT CERVICAL SPINE WITHOUT CONTRAST TECHNIQUE: Multidetector CT imaging of the head and cervical spine was performed following the standard protocol without intravenous contrast. Multiplanar CT image reconstructions of the cervical spine were also generated. COMPARISON:  None. FINDINGS:  CT HEAD FINDINGS Brain: Mild generalized atrophy, moderate chronic small vessel ischemia. No intracranial hemorrhage, mass effect, or midline shift. Basal gangliar calcifications, typically incidental. No hydrocephalus. The basilar cisterns are patent. No evidence of territorial infarct or acute ischemia. No extra-axial or intracranial fluid collection. Vascular: Atherosclerosis of skullbase vasculature without hyperdense vessel or abnormal calcification. Skull: No fracture or focal lesion. Sinuses/Orbits: Paranasal sinuses and mastoid air cells are clear. The visualized orbits are unremarkable. Other: None. CT CERVICAL SPINE FINDINGS Alignment: Normal. Skull base and vertebrae: No acute fracture. Vertebral body heights are maintained. The dens and skull base are intact. Soft tissues and spinal canal: No prevertebral fluid or swelling. No visible canal hematoma. Disc levels:  Minimal C4-C5 disc space narrowing. Upper chest: Right distal clavicle fracture with associated soft tissue edema, partially included. Breathing motion artifact, probable emphysema. Other: Carotid calcifications. IMPRESSION: 1.  No acute intracranial abnormality.  No skull fracture. 2. No fracture or subluxation of the cervical spine. 3. Distal right clavicle fracture, partially included, previously characterized on shoulder radiograph. Electronically Signed   By: Jeb Levering M.D.   On: 08/27/2017 01:36   Ct Cervical Spine Wo Contrast  Result Date: 08/27/2017 CLINICAL DATA:  Fall going up brick steps. EXAM: CT HEAD WITHOUT CONTRAST CT CERVICAL SPINE WITHOUT CONTRAST TECHNIQUE: Multidetector CT imaging of the head and cervical spine was performed following the standard protocol without intravenous contrast. Multiplanar CT image reconstructions of the cervical spine were also generated. COMPARISON:  None. FINDINGS: CT HEAD FINDINGS Brain: Mild generalized atrophy, moderate chronic small vessel ischemia. No intracranial hemorrhage, mass  effect, or midline shift. Basal gangliar calcifications, typically incidental. No hydrocephalus. The basilar cisterns are patent. No evidence of territorial infarct or acute ischemia. No extra-axial or intracranial fluid collection. Vascular: Atherosclerosis of skullbase vasculature without hyperdense vessel or abnormal calcification. Skull: No fracture or focal lesion. Sinuses/Orbits: Paranasal sinuses and mastoid air cells are clear. The visualized orbits are unremarkable. Other: None. CT CERVICAL SPINE FINDINGS Alignment: Normal. Skull base  and vertebrae: No acute fracture. Vertebral body heights are maintained. The dens and skull base are intact. Soft tissues and spinal canal: No prevertebral fluid or swelling. No visible canal hematoma. Disc levels:  Minimal C4-C5 disc space narrowing. Upper chest: Right distal clavicle fracture with associated soft tissue edema, partially included. Breathing motion artifact, probable emphysema. Other: Carotid calcifications. IMPRESSION: 1.  No acute intracranial abnormality.  No skull fracture. 2. No fracture or subluxation of the cervical spine. 3. Distal right clavicle fracture, partially included, previously characterized on shoulder radiograph. Electronically Signed   By: Jeb Levering M.D.   On: 08/27/2017 01:36   Dg Hip Unilat With Pelvis 2-3 Views Right  Result Date: 08/27/2017 CLINICAL DATA:  [place] downstairs tonight. EXAM: DG HIP (WITH OR WITHOUT PELVIS) 2-3V RIGHT COMPARISON:  Abdominal radiograph January 18, 2017 FINDINGS: Acute RIGHT femoral neck fracture with impaction, slight internal rotation of the distal bony fragments. No dislocation. Osteopenia. Limited assessment due to extensive soft tissue calcifications. Severe vascular calcifications. IMPRESSION: Acute RIGHT femoral neck fracture.  No dislocation. 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   On: 08/27/2017 00:14    Dg Knee 3 View Right  Result Date: 08/27/2017 CLINICAL DATA:  Fall down stairs tonight.  Right hip fracture. EXAM: RIGHT KNEE - 3 VIEW COMPARISON:  None. FINDINGS: No acute fracture or subluxation. The alignment and joint spaces are maintained. Trace peripheral spurring. No joint effusion. The bones are under mineralized. Advanced vascular calcifications. Scattered soft tissue calcifications. IMPRESSION: No acute fracture or subluxation of the right knee. Electronically Signed   By: Jeb Levering M.D.   On: 08/27/2017 01:07    Review of Systems  All other systems reviewed and are negative.  Blood pressure (!) 157/86, pulse (!) 101, temperature 99.1 F (37.3 C), temperature source Oral, resp. rate 13, last menstrual period 06/20/2012, SpO2 100 %. Physical Exam  Constitutional: She is oriented to person, place, and time. She appears well-developed and well-nourished.  HENT:  Head: Normocephalic and atraumatic.  Eyes: EOM are normal. Pupils are equal, round, and reactive to light.  Neck: Normal range of motion. Neck supple.  Cardiovascular: Normal rate and regular rhythm.  Respiratory: Effort normal and breath sounds normal.  GI: Soft. Bowel sounds are normal.  Musculoskeletal:       Right hip: She exhibits decreased range of motion, decreased strength, tenderness and bony tenderness.  Neurological: She is alert and oriented to person, place, and time.  Skin: Skin is warm and dry.  Psychiatric: She has a normal mood and affect.    Assessment/Plan: Right hip with displaced right femoral neck/hip fracture  1)  I have spoken to her in length about my recommendation for a right total hip replacement.  The risks and benefits of surgery of surgery were explained in detail.  She is NPO.  Will take her to the OR later today.  Mcarthur Rossetti 08/27/2017, 7:00 AM

## 2017-08-27 NOTE — H&P (Addendum)
History and Physical    Allison Beasley ZOX:[id number] DOB: [birth year] DOA: 08/26/2017  Referring MD/NP/PA:   PCP: Alain Honey, MD   Patient coming from:  The patient is coming from home.  At baseline, pt is partially dependent for most of ADL.   Chief Complaint: pain in right shoulder, right hip and right knee after fall  HPI:  Allison Beasley is a 56 y.o. female with medical history significant of diet controlled diabetes, rheumatoid arthritis, polymyositis, PVD, interstitial lung disease, anemia, GERD, depression, who presents with pain in right shoulder, right hip and right knee after fall.  She states that she was going up some steps, tripped on the door frame, fell, and landed on the right side at about 8:00 PM. She developed severe pain in right shoulder, right hip and right knee. The patient is constant, sharp, 10 out of 10 in severity, nonradiating. She denies head injury or LOC, but states that she also has neck pain. She has been unable to ambulate since the incident. Denies any numbness, weakness, or tingling in legs.  Pt does not have chest pain, shortness rest, cough, fever or chills. No symptoms of UTI. Denies nausea, vomiting, diarrhea or abdominal pain.   ED Course: pt was found to have WBC 10.5, electrolytes renal function okay, no fever, no tachycardia, oxygen saturation 100% on room air. Chest x-ray showed interstitial lung disease, but showed right clavicle fracture. Lumbar spine x-ray negative. X-ray of the right hip showed acute RIGHT femoral neck fracture. X-ray of right knee, CT-head and CT-neck are negative. Pt is admitted to MedSurg bed as inpatient. Orthopedic surgeon, Dr. Rayburn Ma was consulted, likely to do surgery at noon.  Review of Systems:   General: no fevers, chills, no body weight gain, has fatigue HEENT: no blurry vision, hearing changes or sore throat Respiratory: no dyspnea, coughing, wheezing CV: no chest pain, no palpitations GI: no nausea, vomiting,  abdominal pain, diarrhea, constipation GU: no dysuria, burning on urination, increased urinary frequency, hematuria  Ext: no leg edema Neuro: no unilateral weakness, numbness, or tingling, no vision change or hearing loss. Had fall. Skin: no rash, no skin tear. MSK: pain in right shoulder, right hip and right knee, also in neck. Heme: No easy bruising.  Travel history: No recent long distant travel.  Allergy:  Allergies  Allergen Reactions  . Clindamycin/Lincomycin Itching and Rash    Possible reaction (took clindamycin and cipro at the same time)  . Doxycycline Nausea Only  . Percocet [Oxycodone-Acetaminophen] Itching  . Ciprofloxacin Itching and Rash    Possible reaction (took clindamycin and cipro at the same time)   . Mycophenolate Diarrhea    Past Medical History:  Diagnosis Date  . Anemia   . Anxiety   . Constipation   . Depression   . Fibromyalgia   . GERD (gastroesophageal reflux disease)   . H. influenzae infection 2018  . Headache(784.0)   . Hypertension   . Interstitial pulmonary disease, unspecified (HCC) 2018  . Open wound of left buttock 56/2018  . Osteoarthritis   . Osteopenia   . PAD (peripheral artery disease) (HCC)   . Peripheral vascular disease (HCC)   . Pneumonia 03/2012  . Polymyositis (HCC)   . Pressure ulcer of buttock 02/2017   right Buttock-   . Pressure ulcer- Left Heel 2018   unstageable   . Raynaud phenomenon   . Rheumatoid arthritis (HCC)   . Seizures (HCC)    with chid birth 65f 2 childre  .  Sepsis (HCC) 2018  . Shortness of breath    with exertion  . Type II diabetes mellitus (HCC)    on no medications    Past Surgical History:  Procedure Laterality Date  . CESAREAN SECTION     x3  . COLONOSCOPY    . HYSTEROSCOPY     w/ D&C with excision of endometrial polyps  . IRRIGATION AND DEBRIDEMENT FOOT Right 10/26/2015  . left great toenail removal  Left 2013   done by [facility]  . TOE AMPUTATION Left 05/17/2017   5th toe    . TONSILLECTOMY Bilateral   . TUBAL LIGATION      Social History:  reports that  has never smoked. she has never used smokeless tobacco. She reports that she does not drink alcohol or use drugs.  Family History:  Family History  Problem Relation Age of Onset  . Diabetes Mother   . Hypertension Mother   . Diabetes Father   . Heart disease Father   . Hypertension Sister   . Heart disease Sister      Prior to Admission medications   Medication Sig Start Date End Date Taking? Authorizing Provider  amLODipine (NORVASC) 5 MG tablet Take 5 mg by mouth daily. (0900)   Yes [provider]  aspirin EC 81 MG tablet Take 81 mg by mouth daily. (0900)   Yes [provider]  Cholecalciferol 1000 units tablet Take 1,000 Units by mouth daily. (0900)   Yes [provider]  DULoxetine (CYMBALTA) 30 MG capsule Take 30 mg by mouth daily. 08/22/17  Yes [provider]  famotidine (PEPCID) 20 MG tablet Take 20 mg by mouth 2 (two) times daily.   Yes [provider]  linezolid (ZYVOX) 600 MG tablet Take 600 mg by mouth 2 (two) times daily. Started 08/07/17 for 21 days 08/07/17 08/28/17 Yes [provider]  Melatonin 3 MG TABS Take 3 mg by mouth at bedtime.   Yes [provider]  metoprolol tartrate (LOPRESSOR) 25 MG tablet Take 25 mg by mouth 2 (two) times daily.    Yes [provider]  Multiple Vitamin (MULTIVITAMIN) tablet Take 1 tablet by mouth daily. (0900)   Yes [provider]  Omega-3 Fatty Acids (FISH OIL) 1000 MG CAPS Take 1,000 mg by mouth daily. (0900)   Yes [provider]  oxycodone (OXY-IR) 5 MG capsule Take 5 mg by mouth 3 (three) times daily as needed for pain.   Yes [provider]  predniSONE (DELTASONE) 5 MG tablet Take 5 mg by mouth daily with breakfast.   Yes [provider]  risperiDONE (RISPERDAL) 0.5 MG tablet Take 0.5 mg by mouth at bedtime. (2100)   Yes [provider]   sennosides-docusate sodium (SENOKOT-S) 8.6-50 MG tablet Take 1 tablet by mouth at bedtime.   Yes [provider]  Skin Protectants, Misc. (EUCERIN) cream Apply 1 application topically daily. Apply to whole body   Yes [provider]  tacrolimus (PROGRAF) 1 MG capsule Take 2 mg by mouth 2 (two) times daily.   Yes [provider]  collagenase (SANTYL) ointment Apply topically daily. Patient not taking: Reported on 08/27/2017 05/20/17   Maris Berger A, PA-C  oxyCODONE (OXY IR/ROXICODONE) 5 MG immediate release tablet Take 0.5 tablets (2.5 mg total) by mouth 3 (three) times daily. Patient not taking: Reported on 08/27/2017 05/17/17   Dara Lords, PA-C    Physical Exam: Vitals:   08/27/17 0115 08/27/17 0130 08/27/17 0145  08/27/17 0218  BP: (!) 147/98 (!) 142/92 135/88 (!) 157/86  Pulse:    (!) 101  Resp: 16 13 13    Temp:    99.1 F (37.3 C)  TempSrc:    Oral  SpO2:       General: Not in acute distress HEENT:       Eyes: PERRL, EOMI, no scleral icterus.       ENT: No discharge from the ears and nose, no pharynx injection, no tonsillar enlargement.        Neck: No JVD, no bruit, no mass felt. Heme: No neck lymph node enlargement. Cardiac: S1/S2, RRR, No murmurs, No gallops or rubs. Respiratory: No rales, wheezing, rhonchi or rubs. GI: Soft, nondistended, nontender, no rebound pain, no organomegaly, BS present. GU: No hematuria Ext: No pitting leg edema bilaterally. 1+DP/PT pulse bilaterally. Missing great toes bilaterally. Musculoskeletal: Tenderness to right shoulder, right any and the right hip Skin: No rashes.  Neuro: Alert, oriented X3, cranial nerves II-XII grossly intact, moves all extremities Psych: Patient is not psychotic, no suicidal or hemocidal ideation.  Labs on Admission: I have personally reviewed following labs and imaging studies  CBC: Recent Labs  Lab 08/26/17 2300  WBC 10.5  NEUTROABS 4.9  HGB 11.4*  HCT 35.3*  MCV 85.7  PLT  68*   Basic Metabolic Panel: Recent Labs  Lab 08/26/17 2300  NA 131*  K 3.8  CL 101  CO2 20*  GLUCOSE 108*  BUN 14  CREATININE 0.85  CALCIUM 8.2*   GFR: CrCl cannot be calculated (Unknown ideal weight.). Liver Function Tests: No results for input(s): AST, ALT, ALKPHOS, BILITOT, PROT, ALBUMIN in the last 168 hours. No results for input(s): LIPASE, AMYLASE in the last 168 hours. No results for input(s): AMMONIA in the last 168 hours. Coagulation Profile: Recent Labs  Lab 08/26/17 2300  INR 0.97   Cardiac Enzymes: No results for input(s): CKTOTAL, CKMB, CKMBINDEX, TROPONINI in the last 168 hours. BNP (last 3 results) No results for input(s): PROBNP in the last 8760 hours. HbA1C: No results for input(s): HGBA1C in the last 72 hours. CBG: No results for input(s): GLUCAP in the last 168 hours. Lipid Profile: No results for input(s): CHOL, HDL, LDLCALC, TRIG, CHOLHDL, LDLDIRECT in the last 72 hours. Thyroid Function Tests: No results for input(s): TSH, T4TOTAL, FREET4, T3FREE, THYROIDAB in the last 72 hours. Anemia Panel: No results for input(s): VITAMINB12, FOLATE, FERRITIN, TIBC, IRON, RETICCTPCT in the last 72 hours. Urine analysis:    Component Value Date/Time   COLORURINE YELLOW 01/31/2017 1035   APPEARANCEUR CLOUDY (A) 01/31/2017 1035   LABSPEC 1.020 01/31/2017 1035   PHURINE 5.5 01/31/2017 1035   GLUCOSEU NEGATIVE 01/31/2017 1035   HGBUR LARGE (A) 01/31/2017 1035   BILIRUBINUR NEGATIVE 01/31/2017 1035   KETONESUR NEGATIVE 01/31/2017 1035   PROTEINUR 30 (A) 01/31/2017 1035   UROBILINOGEN 0.2 12/24/2012 0705   NITRITE POSITIVE (A) 01/31/2017 1035   LEUKOCYTESUR LARGE (A) 01/31/2017 1035   Sepsis Labs: @LABRCNTIP (procalcitonin:4,lacticidven:4) )No results found for this or any previous visit (from the past 240 hour(s)).   Radiological Exams on Admission: Dg Chest 1 View  Result Date: 08/27/2017 CLINICAL DATA:  Larey SeatFell downstairs tonight. EXAM: CHEST 1 VIEW  COMPARISON:  Chest radiograph February 01, 2017 FINDINGS: Cardiac silhouette is upper limits of normal in size. Calcified aortic knob. Mild chronic interstitial changes without pleural effusion or focal consolidation. LEFT ear and a RIGHT bibasilar suspected fibrosis. Mildly elevated RIGHT hemidiaphragm. No pneumothorax. Osteopenia. Acute  RIGHT distal clavicle fracture. Extensive soft tissue calcifications. IMPRESSION: Borderline cardiomegaly. Mild chronic interstitial changes and bibasilar suspected fibrosis. Acute RIGHT distal clavicle fracture. 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 Electronically Signed   By: Awilda Metro M.D.   On: 08/27/2017 00:19   Dg Lumbar Spine Complete  Result Date: 08/27/2017 CLINICAL DATA:  Larey Seat downstairs tonight. EXAM: LUMBAR SPINE - COMPLETE 4+ VIEW COMPARISON:  Abdominal radiograph January 22, 2017 FINDINGS: There is no evidence of lumbar spine fracture. Alignment is normal. Osteopenia. Intervertebral disc spaces are maintained. Extensive soft tissue calcifications, limiting assessment of the lumbosacral junction on frontal radiograph. IMPRESSION: No acute fracture deformity or malalignment. 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 Electronically Signed   By: Awilda Metro M.D.   On: 08/27/2017 00:17   Dg Shoulder Right  Result Date: 08/27/2017 CLINICAL DATA:  Larey Seat downstairs tonight. EXAM: RIGHT SHOULDER - 2+ VIEW COMPARISON:  Chest radiograph February 01, 2017 FINDINGS: Distal clavicle now projects above the acromion, with acute suspected distal clavicle fracture. Osteopenia without destructive bony lesions. Humeral head is located. Extensive soft tissue calcifications. IMPRESSION: Probable acute distal clavicle displaced fracture. 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 Electronically Signed   By: Awilda Metro M.D.   On: 08/27/2017 00:16   Ct Head Wo  Contrast  Result Date: 08/27/2017 CLINICAL DATA:  Fall going up brick steps. EXAM: CT HEAD WITHOUT CONTRAST CT CERVICAL SPINE WITHOUT CONTRAST TECHNIQUE: Multidetector CT imaging of the head and cervical spine was performed following the standard protocol without intravenous contrast. Multiplanar CT image reconstructions of the cervical spine were also generated. COMPARISON:  None. FINDINGS: CT HEAD FINDINGS Brain: Mild generalized atrophy, moderate chronic small vessel ischemia. No intracranial hemorrhage, mass effect, or midline shift. Basal gangliar calcifications, typically incidental. No hydrocephalus. The basilar cisterns are patent. No evidence of territorial infarct or acute ischemia. No extra-axial or intracranial fluid collection. Vascular: Atherosclerosis of skullbase vasculature without hyperdense vessel or abnormal calcification. Skull: No fracture or focal lesion. Sinuses/Orbits: Paranasal sinuses and mastoid air cells are clear. The visualized orbits are unremarkable. Other: None. CT CERVICAL SPINE FINDINGS Alignment: Normal. Skull base and vertebrae: No acute fracture. Vertebral body heights are maintained. The dens and skull base are intact. Soft tissues and spinal canal: No prevertebral fluid or swelling. No visible canal hematoma. Disc levels:  Minimal C4-C5 disc space narrowing. Upper chest: Right distal clavicle fracture with associated soft tissue edema, partially included. Breathing motion artifact, probable emphysema. Other: Carotid calcifications. IMPRESSION: 1.  No acute intracranial abnormality.  No skull fracture. 2. No fracture or subluxation of the cervical spine. 3. Distal right clavicle fracture, partially included, previously characterized on shoulder radiograph. Electronically Signed   By: Rubye Oaks M.D.   On: 08/27/2017 01:36   Ct Cervical Spine Wo Contrast  Result Date: 08/27/2017 CLINICAL DATA:  Fall going up brick steps. EXAM: CT HEAD WITHOUT CONTRAST CT CERVICAL  SPINE WITHOUT CONTRAST TECHNIQUE: Multidetector CT imaging of the head and cervical spine was performed following the standard protocol without intravenous contrast. Multiplanar CT image reconstructions of the cervical spine were also generated. COMPARISON:  None. FINDINGS: CT HEAD FINDINGS Brain: Mild generalized atrophy, moderate chronic small vessel ischemia. No intracranial hemorrhage, mass effect, or midline shift. Basal gangliar calcifications, typically incidental. No hydrocephalus. The basilar cisterns are patent. No evidence of territorial infarct or acute ischemia. No extra-axial or intracranial fluid collection. Vascular: Atherosclerosis of skullbase vasculature without hyperdense vessel or abnormal calcification. Skull: No fracture or focal lesion.  Sinuses/Orbits: Paranasal sinuses and mastoid air cells are clear. The visualized orbits are unremarkable. Other: None. CT CERVICAL SPINE FINDINGS Alignment: Normal. Skull base and vertebrae: No acute fracture. Vertebral body heights are maintained. The dens and skull base are intact. Soft tissues and spinal canal: No prevertebral fluid or swelling. No visible canal hematoma. Disc levels:  Minimal C4-C5 disc space narrowing. Upper chest: Right distal clavicle fracture with associated soft tissue edema, partially included. Breathing motion artifact, probable emphysema. Other: Carotid calcifications. IMPRESSION: 1.  No acute intracranial abnormality.  No skull fracture. 2. No fracture or subluxation of the cervical spine. 3. Distal right clavicle fracture, partially included, previously characterized on shoulder radiograph. Electronically Signed   By: Rubye Oaks M.D.   On: 08/27/2017 01:36   Dg Hip Unilat With Pelvis 2-3 Views Right  Result Date: 08/27/2017 CLINICAL DATA:  Larey Seat downstairs tonight. EXAM: DG HIP (WITH OR WITHOUT PELVIS) 2-3V RIGHT COMPARISON:  Abdominal radiograph January 18, 2017 FINDINGS: Acute RIGHT femoral neck fracture with impaction,  slight internal rotation of the distal bony fragments. No dislocation. Osteopenia. Limited assessment due to extensive soft tissue calcifications. Severe vascular calcifications. IMPRESSION: Acute RIGHT femoral neck fracture.  No dislocation. 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   On: 08/27/2017 00:14   Dg Knee 3 View Right  Result Date: 08/27/2017 CLINICAL DATA:  Fall down stairs tonight.  Right hip fracture. EXAM: RIGHT KNEE - 3 VIEW COMPARISON:  None. FINDINGS: No acute fracture or subluxation. The alignment and joint spaces are maintained. Trace peripheral spurring. No joint effusion. The bones are under mineralized. Advanced vascular calcifications. Scattered soft tissue calcifications. IMPRESSION: No acute fracture or subluxation of the right knee. Electronically Signed   By: Rubye Oaks M.D.   On: 08/27/2017 01:07     EKG: Independently reviewed. Sinus rhythm, QTC 478, early R-wave progression  Assessment/Plan Principal Problem:   Closed right hip fracture (HCC) Active Problems:   Polymyositis (HCC)   GERD (gastroesophageal reflux disease)   Interstitial lung disease (HCC)   RA (rheumatoid arthritis) (HCC)   Depression   Closed nondisplaced fracture of lateral end of right clavicle   Fall and closed right hip fracture and closed nondisplaced fracture of lateral end of right clavicle: pt seems to have had a mechenical fall. CT-head and neck are negative. Patient has severe now. No neurovascular compromise. Orthopedic surgeon was consulted. Dr. Magnus Ivan plans to do surgery around noon.  - will admit to Med-surg bed as inpt - Pain control: dilaudid prn and oxycodone - When necessary Zofran for nausea - Robaxin for muscle spasm - type and cross - INR/PTT Magnus Ivan plans to do surgery around noon  RA and polymyositis: Patient used to be on prednisone and tacrolimus. Per her daughter,  tacrolimus was recently discontinued. -continue home dose of prednisone -give Solu Cortef 50 mg 1 as stress dose -Check cortisol level  ILD: Patient does not have for respiratory distress, no chest pain, shortness of breath or cough. -When necessary albuterol nebulizers  Gerd: -pepcid  Depression: -continue home medication: Cymbalta  Thrombocytopenia: Patient is 32, etiology is not clear, may be related to bone fracture. Mental status normal. -Follow-up by CBC -check LDH and peripheral smear  Possible HTN: Patient does not have hypertension on problem list, but her blood pressure is elevated, 155/105. She is on metoprolol at home. -Continue metoprolol -IVF hydralazine when necessary   DVT ppx: SCD Code Status: Full code Family  Communication:   Yes, patient's daugher at bed side Disposition Plan:  Anticipate discharge to SNF Consults called:  Ortho, Dr. Magnus Ivan Admission status:  Inpatient/tele         Date of Service 08/27/2017    Lorretta Harp [facility] Pager [phone]  If 7PM-7AM, please contact night-coverage [email or web address] Password [email or web address] 08/27/2017, 4:27 AM

## 2017-08-27 NOTE — Progress Notes (Signed)
PROGRESS NOTE  Allison Beasley KPV:[id number] DOB: [birth year] DOA: 08/26/2017 PCP: Alain Honey, MD   LOS: 0 days   Brief Narrative / Interim history: Allison Beasley is a 56 y.o. female with medical history significant of diet controlled diabetes, rheumatoid arthritis, polymyositis, PVD, interstitial lung disease, anemia, GERD, depression, who presents with pain in right shoulder, right hip and right knee after fall.  Assessment & Plan: Principal Problem:   Closed right hip fracture (HCC) Active Problems:   Polymyositis (HCC)   GERD (gastroesophageal reflux disease)   Interstitial lung disease (HCC)   RA (rheumatoid arthritis) (HCC)   Depression   Closed nondisplaced fracture of lateral end of right clavicle   Fall and closed right hip fracture and closed nondisplaced fracture of lateral end of right clavicle  -Orthopedic surgery consulted, patient was taken to the operating room on 11/4 and is status post right total hip arthroplasty by Dr. Magnus Ivan.  Appreciate input.  DVT prophylaxis per orthopedic surgery. -Monitor CBC -PT to evaluate tomorrow, suspect she will need SNF level of care  RA and polymyositis  -Patient used to be on prednisone and tacrolimus. Per her daughter, tacrolimus was recently discontinued. 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  This is stable currently.  Left upper extremity wound -Followed by wound care at [facility].  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 -Wound care consulted  ILD -Patient does not have for respiratory distress, no chest pain, shortness of breath or cough. -When necessary albuterol nebulizers  Gerd -pepcid  Depression: -continue home medication: Cymbalta  Thrombocytopenia -Patient has been having episodes of thrombocytopenia in  the past, this is well known to family.  Will monitor tomorrow morning.  No bleeding right now.  HTN -Continue metoprolol -IVF hydralazine when necessary -Blood pressure stable 111/68 today    DVT prophylaxis: SCD Code Status: Full code Family Communication: extensive family bedside Disposition Plan: TBD  Consultants:   Orthopedic surgery  Procedures:  Right total hip arthroplasty through direct anterior approach -Dr. Magnus Ivan, 08/27/2018  Antimicrobials:  Ancef preop  Zyvox prior to hospitalization  Subjective: -Seen postop, she is doing well, does not have much pain.  No chest pain, shortness of breath.  No abdominal pain, nausea or vomiting.  Objective: Vitals:   08/27/17 1226 08/27/17 1230 08/27/17 1237 08/27/17 1300  BP: 125/82   111/68  Pulse: 89 87 82 85  Resp: 13 16 11 16   Temp:   98.3 F (36.8 C) 98.2 F (36.8 C)  TempSrc:      SpO2: 98% 96% 99% 99%    Intake/Output Summary (Last 24 hours) at 08/27/2017 1423 Last data filed at 08/27/2017 1132 Gross per 24 hour  Intake 1447.75 ml  Output 700 ml  Net 747.75 ml   There were no vitals filed for this visit.  Examination:  Constitutional: NAD Eyes: lids and conjunctivae normal ENMT: Mucous membranes are moist.  Respiratory: clear to auscultation bilaterally, no wheezing, no crackles.  Cardiovascular: Regular rate and rhythm, no murmurs / rubs / gallops. No LE edema.  Abdomen: no tenderness. Bowel sounds positive.  Skin: Left upper extremity wound, dressing clean Neurologic: CN 2-12 grossly intact. Strength 5/5 in all 4.  Psychiatric: Normal judgment and insight. Alert and oriented x 3. Normal mood.    Data Reviewed: I have independently  reviewed following labs and imaging studies   CBC: Recent Labs  Lab 08/26/17 2300  WBC 10.5  NEUTROABS 4.9  HGB 11.4*  HCT 35.3*  MCV 85.7  PLT 68*   Basic Metabolic Panel: Recent Labs  Lab 08/26/17 2300  NA 131*  K 3.8  CL 101  CO2 20*  GLUCOSE  108*  BUN 14  CREATININE 0.85  CALCIUM 8.2*   GFR: CrCl cannot be calculated (Unknown ideal weight.). Liver Function Tests: No results for input(s): AST, ALT, ALKPHOS, BILITOT, PROT, ALBUMIN in the last 168 hours. No results for input(s): LIPASE, AMYLASE in the last 168 hours. No results for input(s): AMMONIA in the last 168 hours. Coagulation Profile: Recent Labs  Lab 08/26/17 2300  INR 0.97   Cardiac Enzymes: No results for input(s): CKTOTAL, CKMB, CKMBINDEX, TROPONINI in the last 168 hours. BNP (last 3 results) No results for input(s): PROBNP in the last 8760 hours. HbA1C: No results for input(s): HGBA1C in the last 72 hours. CBG: Recent Labs  Lab 08/27/17 0823 08/27/17 1151 08/27/17 1339  GLUCAP 129* 135* 129*   Lipid Profile: No results for input(s): CHOL, HDL, LDLCALC, TRIG, CHOLHDL, LDLDIRECT in the last 72 hours. Thyroid Function Tests: No results for input(s): TSH, T4TOTAL, FREET4, T3FREE, THYROIDAB in the last 72 hours. Anemia Panel: No results for input(s): VITAMINB12, FOLATE, FERRITIN, TIBC, IRON, RETICCTPCT in the last 72 hours. Urine analysis:    Component Value Date/Time   COLORURINE YELLOW 01/31/2017 1035   APPEARANCEUR CLOUDY (A) 01/31/2017 1035   LABSPEC 1.020 01/31/2017 1035   PHURINE 5.5 01/31/2017 1035   GLUCOSEU NEGATIVE 01/31/2017 1035   HGBUR LARGE (A) 01/31/2017 1035   BILIRUBINUR NEGATIVE 01/31/2017 1035   KETONESUR NEGATIVE 01/31/2017 1035   PROTEINUR 30 (A) 01/31/2017 1035   UROBILINOGEN 0.2 12/24/2012 0705   NITRITE POSITIVE (A) 01/31/2017 1035   LEUKOCYTESUR LARGE (A) 01/31/2017 1035   Sepsis Labs: Invalid input(s): PROCALCITONIN, LACTICIDVEN  Recent Results (from the past 240 hour(s))  Surgical pcr screen     Status: None   Collection Time: 08/27/17  3:36 AM  Result Value Ref Range Status   MRSA, PCR NEGATIVE NEGATIVE Final   Staphylococcus aureus NEGATIVE NEGATIVE Final    Comment: (NOTE) The Xpert SA Assay (FDA approved  for NASAL specimens in patients 14 years of age and older), is one component of a comprehensive surveillance program. It is not intended to diagnose infection nor to guide or monitor treatment.       Radiology Studies: Dg Chest 1 View  Result Date: 08/27/2017 CLINICAL DATA:  Larey Seat downstairs tonight. EXAM: CHEST 1 VIEW COMPARISON:  Chest radiograph February 01, 2017 FINDINGS: Cardiac silhouette is upper limits of normal in size. Calcified aortic knob. Mild chronic interstitial changes without pleural effusion or focal consolidation. LEFT ear and a RIGHT bibasilar suspected fibrosis. Mildly elevated RIGHT hemidiaphragm. No pneumothorax. Osteopenia. Acute RIGHT distal clavicle fracture. Extensive soft tissue calcifications. IMPRESSION: Borderline cardiomegaly. Mild chronic interstitial changes and bibasilar suspected fibrosis. Acute RIGHT distal clavicle fracture. 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 Electronically Signed   By: Awilda Metro M.D.   On: 08/27/2017 00:19   Dg Lumbar Spine Complete  Result Date: 08/27/2017 CLINICAL DATA:  Larey Seat downstairs tonight. EXAM: LUMBAR SPINE - COMPLETE 4+ VIEW COMPARISON:  Abdominal radiograph January 22, 2017 FINDINGS: There is no evidence of lumbar spine fracture. Alignment is normal. Osteopenia. Intervertebral disc spaces are maintained. Extensive soft tissue calcifications, limiting assessment of the lumbosacral junction on frontal  radiograph. IMPRESSION: No acute fracture deformity or malalignment. 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 Electronically Signed   By: Awilda Metro M.D.   On: 08/27/2017 00:17   Dg Shoulder Right  Result Date: 08/27/2017 CLINICAL DATA:  Larey Seat downstairs tonight. EXAM: RIGHT SHOULDER - 2+ VIEW COMPARISON:  Chest radiograph February 01, 2017 FINDINGS: Distal clavicle now projects above the acromion, with acute suspected distal clavicle fracture. Osteopenia  without destructive bony lesions. Humeral head is located. Extensive soft tissue calcifications. IMPRESSION: Probable acute distal clavicle displaced fracture. 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 Electronically Signed   By: Awilda Metro M.D.   On: 08/27/2017 00:16   Ct Head Wo Contrast  Result Date: 08/27/2017 CLINICAL DATA:  Fall going up brick steps. EXAM: CT HEAD WITHOUT CONTRAST CT CERVICAL SPINE WITHOUT CONTRAST TECHNIQUE: Multidetector CT imaging of the head and cervical spine was performed following the standard protocol without intravenous contrast. Multiplanar CT image reconstructions of the cervical spine were also generated. COMPARISON:  None. FINDINGS: CT HEAD FINDINGS Brain: Mild generalized atrophy, moderate chronic small vessel ischemia. No intracranial hemorrhage, mass effect, or midline shift. Basal gangliar calcifications, typically incidental. No hydrocephalus. The basilar cisterns are patent. No evidence of territorial infarct or acute ischemia. No extra-axial or intracranial fluid collection. Vascular: Atherosclerosis of skullbase vasculature without hyperdense vessel or abnormal calcification. Skull: No fracture or focal lesion. Sinuses/Orbits: Paranasal sinuses and mastoid air cells are clear. The visualized orbits are unremarkable. Other: None. CT CERVICAL SPINE FINDINGS Alignment: Normal. Skull base and vertebrae: No acute fracture. Vertebral body heights are maintained. The dens and skull base are intact. Soft tissues and spinal canal: No prevertebral fluid or swelling. No visible canal hematoma. Disc levels:  Minimal C4-C5 disc space narrowing. Upper chest: Right distal clavicle fracture with associated soft tissue edema, partially included. Breathing motion artifact, probable emphysema. Other: Carotid calcifications. IMPRESSION: 1.  No acute intracranial abnormality.  No skull fracture. 2. No fracture or subluxation of the cervical spine.  3. Distal right clavicle fracture, partially included, previously characterized on shoulder radiograph. Electronically Signed   By: Rubye Oaks M.D.   On: 08/27/2017 01:36   Ct Cervical Spine Wo Contrast  Result Date: 08/27/2017 CLINICAL DATA:  Fall going up brick steps. EXAM: CT HEAD WITHOUT CONTRAST CT CERVICAL SPINE WITHOUT CONTRAST TECHNIQUE: Multidetector CT imaging of the head and cervical spine was performed following the standard protocol without intravenous contrast. Multiplanar CT image reconstructions of the cervical spine were also generated. COMPARISON:  None. FINDINGS: CT HEAD FINDINGS Brain: Mild generalized atrophy, moderate chronic small vessel ischemia. No intracranial hemorrhage, mass effect, or midline shift. Basal gangliar calcifications, typically incidental. No hydrocephalus. The basilar cisterns are patent. No evidence of territorial infarct or acute ischemia. No extra-axial or intracranial fluid collection. Vascular: Atherosclerosis of skullbase vasculature without hyperdense vessel or abnormal calcification. Skull: No fracture or focal lesion. Sinuses/Orbits: Paranasal sinuses and mastoid air cells are clear. The visualized orbits are unremarkable. Other: None. CT CERVICAL SPINE FINDINGS Alignment: Normal. Skull base and vertebrae: No acute fracture. Vertebral body heights are maintained. The dens and skull base are intact. Soft tissues and spinal canal: No prevertebral fluid or swelling. No visible canal hematoma. Disc levels:  Minimal C4-C5 disc space narrowing. Upper chest: Right distal clavicle fracture with associated soft tissue edema, partially included. Breathing motion artifact, probable emphysema. Other: Carotid calcifications. IMPRESSION: 1.  No acute intracranial abnormality.  No skull fracture. 2. No fracture or subluxation of the cervical spine. 3.  Distal right clavicle fracture, partially included, previously characterized on shoulder radiograph. Electronically  Signed   By: Rubye Oaks M.D.   On: 08/27/2017 01:36   Dg C-arm 1-60 Min  Result Date: 08/27/2017 CLINICAL DATA:  Operative imaging for right hip arthroplasty. EXAM: OPERATIVE RIGHT HIP (WITH PELVIS IF PERFORMED) 3 VIEWS TECHNIQUE: Fluoroscopic spot image(s) were submitted for interpretation post-operatively. COMPARISON:  None. FINDINGS: New total right hip arthroplasty appears well seated and aligned on the provided views. No evidence of acute fracture or operative complication. IMPRESSION: Well-positioned right hip total arthroplasty. Electronically Signed   By: Amie Portland M.D.   On: 08/27/2017 14:14   Dg Hip Operative Unilat W Or W/o Pelvis Right  Result Date: 08/27/2017 CLINICAL DATA:  Operative imaging for right hip arthroplasty. EXAM: OPERATIVE RIGHT HIP (WITH PELVIS IF PERFORMED) 3 VIEWS TECHNIQUE: Fluoroscopic spot image(s) were submitted for interpretation post-operatively. COMPARISON:  None. FINDINGS: New total right hip arthroplasty appears well seated and aligned on the provided views. No evidence of acute fracture or operative complication. IMPRESSION: Well-positioned right hip total arthroplasty. Electronically Signed   By: Amie Portland M.D.   On: 08/27/2017 14:14   Dg Hip Unilat With Pelvis 2-3 Views Right  Result Date: 08/27/2017 CLINICAL DATA:  Larey Seat downstairs tonight. EXAM: DG HIP (WITH OR WITHOUT PELVIS) 2-3V RIGHT COMPARISON:  Abdominal radiograph January 18, 2017 FINDINGS: Acute RIGHT femoral neck fracture with impaction, slight internal rotation of the distal bony fragments. No dislocation. Osteopenia. Limited assessment due to extensive soft tissue calcifications. Severe vascular calcifications. IMPRESSION: Acute RIGHT femoral neck fracture.  No dislocation. 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   On: 08/27/2017 00:14   Dg Knee 3 View Right  Result Date: 08/27/2017 CLINICAL DATA:   Fall down stairs tonight.  Right hip fracture. EXAM: RIGHT KNEE - 3 VIEW COMPARISON:  None. FINDINGS: No acute fracture or subluxation. The alignment and joint spaces are maintained. Trace peripheral spurring. No joint effusion. The bones are under mineralized. Advanced vascular calcifications. Scattered soft tissue calcifications. IMPRESSION: No acute fracture or subluxation of the right knee. Electronically Signed   By: Rubye Oaks M.D.   On: 08/27/2017 01:07     Scheduled Meds: . amLODipine  5 mg Oral Daily  . aspirin EC  81 mg Oral Daily  . cholecalciferol  1,000 Units Oral Daily  . famotidine  20 mg Oral BID  . hydrocerin  1 application Topical Daily  . linezolid  600 mg Oral Q12H  . Melatonin  3 mg Oral QHS  . metoprolol tartrate  25 mg Oral BID  . multivitamin with minerals  1 tablet Oral Daily  . omega-3 acid ethyl esters  1 g Oral Daily  . predniSONE  5 mg Oral Q breakfast  . risperiDONE  0.5 mg Oral QHS  . senna-docusate  1 tablet Oral QHS   Continuous Infusions: . sodium chloride 75 mL/hr at 08/27/17 0825  . sodium chloride    . ceFAZolin    . lactated ringers      Pamella Pert, MD, PhD [facility] Pager [phone]  If 7PM-7AM, please contact night-coverage [email or web address] Password [email or web address] 08/27/2017, 2:23 PM

## 2017-08-28 ENCOUNTER — Other Ambulatory Visit: Payer: Self-pay

## 2017-08-28 ENCOUNTER — Encounter (HOSPITAL_COMMUNITY): Payer: Self-pay | Admitting: General Practice

## 2017-08-28 LAB — PREPARE PLATELET PHERESIS: UNIT DIVISION: 0

## 2017-08-28 LAB — BPAM PLATELET PHERESIS
BLOOD PRODUCT EXPIRATION DATE: 201811052359
ISSUE DATE / TIME: 201811040909
UNIT TYPE AND RH: 7300

## 2017-08-28 LAB — BASIC METABOLIC PANEL
Anion gap: 8 (ref 5–15)
BUN: 21 mg/dL — ABNORMAL HIGH (ref 6–20)
CALCIUM: 7.9 mg/dL — AB (ref 8.9–10.3)
CO2: 24 mmol/L (ref 22–32)
CREATININE: 0.83 mg/dL (ref 0.44–1.00)
Chloride: 108 mmol/L (ref 101–111)
Glucose, Bld: 128 mg/dL — ABNORMAL HIGH (ref 65–99)
Potassium: 4.5 mmol/L (ref 3.5–5.1)
SODIUM: 140 mmol/L (ref 135–145)

## 2017-08-28 LAB — CBC WITH DIFFERENTIAL/PLATELET
BASOS PCT: 0 %
Basophils Absolute: 0 10*3/uL (ref 0.0–0.1)
EOS ABS: 0 10*3/uL (ref 0.0–0.7)
Eosinophils Relative: 0 %
HCT: 23.2 % — ABNORMAL LOW (ref 36.0–46.0)
Hemoglobin: 7.4 g/dL — ABNORMAL LOW (ref 12.0–15.0)
Lymphocytes Relative: 26 %
Lymphs Abs: 2.5 10*3/uL (ref 0.7–4.0)
MCH: 27.6 pg (ref 26.0–34.0)
MCHC: 31.9 g/dL (ref 30.0–36.0)
MCV: 86.6 fL (ref 78.0–100.0)
MONO ABS: 1.3 10*3/uL — AB (ref 0.1–1.0)
Monocytes Relative: 14 %
NEUTROS PCT: 60 %
Neutro Abs: 5.8 10*3/uL (ref 1.7–7.7)
PLATELETS: 98 10*3/uL — AB (ref 150–400)
RBC: 2.68 MIL/uL — ABNORMAL LOW (ref 3.87–5.11)
RDW: 16.2 % — ABNORMAL HIGH (ref 11.5–15.5)
WBC: 9.6 10*3/uL (ref 4.0–10.5)

## 2017-08-28 LAB — PREPARE RBC (CROSSMATCH)

## 2017-08-28 MED ORDER — SODIUM CHLORIDE 0.9 % IV SOLN
Freq: Once | INTRAVENOUS | Status: AC
  Administered 2017-08-28: 13:00:00 via INTRAVENOUS

## 2017-08-28 MED ORDER — ENSURE ENLIVE PO LIQD
237.0000 mL | Freq: Two times a day (BID) | ORAL | Status: DC
  Administered 2017-08-28 – 2017-08-31 (×5): 237 mL via ORAL

## 2017-08-28 NOTE — Consult Note (Addendum)
WOC Nurse wound consult note Reason for Consult: Consult requested for buttocks, left breast, and left arm wounds.  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 Wound type: Bilat buttocks/sacrum with patchy areas of full thickness skin loss.  Affected areas are approx 6X6X.2cm across each buttock/sacrum area.  They are hard to touch with mod amt tan drainage in the red moist full thickness wounds, no odor, painful to touch, slightly raised above the skin level.  Pressure Injury POA: These are NOT pressure injuries. Left axilla with hard areas upon palpation, raised above skin level, small amt white drainage.  No open wound at this time, but expect this site will evolve into a full thickness wound. Left arm with patchy areas; 3X2X.2cm, hard to touch with small amt white drainage in the full thickness wounds, no odor, painful to touch, slightly raised above the skin level.  Left breast with patchy areas; 2X2X.2cm hard to touch with small amt white drainage in the full thickness wounds, no odor, painful to touch, slightly raised above the skin level.  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  Topical treatment is directed towards decreasing pain with dressing changes and avoiding adherence of dressings.  Foam dressings to bilat buttocks to protect skin and prevent further injury and absorb drainage.  Continue present plan of care which patient has been using with Vaseline gauze to promote moist healing to left arm and breast.  Discussed plan of care with patient and she verbalized understanding. Please re-consult if further assistance is needed.  Thank-you,  Cammie Mcgee MSN, RN, CWOCN, [place], CNS [phone]

## 2017-08-28 NOTE — Progress Notes (Addendum)
Physical Therapy Evaluation Note  Clinical Impression:  Patient is s/p above surgery resulting in functional limitations due to the deficits listed below (see PT Problem List). Pt states she has 24 h assistance at home with aide, husband, and daughter. Pt very much wants to go home. Would like to see her progress more with functional mobility before dc home. At this point, must consider SNF for rehab to maximize independence and safety with mobility. Will continue to monitor progress with functional mobility and update dc plan accordingly. Patient will benefit from skilled PT to increase their independence and safety with mobility to allow discharge to the venue listed below.    08/28/17 1200  PT Visit Information  Last PT Received On 08/28/17  Assistance Needed +2  History of Present Illness Allison Beasley is a 56 y.o. female with medical history significant of diet controlled diabetes, rheumatoid arthritis, polymyositis, PVD, interstitial lung disease, anemia, GERD, depression, who presents with pain in right shoulder, right hip and right knee after fall.  Precautions  Required Braces or Orthoses Sling (Pt tolerated using R UE without sling; use sling as needed )  Restrictions  Weight Bearing Restrictions No  Other Position/Activity Restrictions WB as tolerated  Home Living  Family/patient expects to be discharged to: Private residence  Living Arrangements Spouse/significant other;Children  Available Help at Discharge Family;Personal care attendant;Available 24 hours/day (M-F)  Type of Home House  Home Access Stairs to enter (2 steps)  Entrance Stairs-Number of Steps 3  Entrance Stairs-Rails Can reach both;Right (uses r most)  Home Layout One level  Air traffic controller (2 in. cushion)  Home Equipment Tub bench;Grab bars - tub/shower  Prior Function  Level of Independence Needs assistance (uses RW)  Communication  Communication No  difficulties  Pain Assessment  Pain Assessment Faces  Faces Pain Scale 4  Pain Location R hip   Pain Descriptors / Indicators Grimacing;Guarding;Moaning  Pain Intervention(s) Ice applied  Cognition  Arousal/Alertness Awake/alert  Behavior During Therapy WFL for tasks assessed/performed  Overall Cognitive Status Within Functional Limits for tasks assessed  Upper Extremity Assessment  Upper Extremity Assessment Overall WFL for tasks assessed  Lower Extremity Assessment  Lower Extremity Assessment Generalized weakness; grossly decreased ROM and strength R hip, limited by pain   Bed Mobility  Overal bed mobility Needs Assistance  Bed Mobility Rolling;Sidelying to Sit  Rolling Max assist;+2 for physical assistance  Sidelying to sit Max assist;+2 for safety/equipment;+2 for physical assistance; VC for technique, hesitant to move in anticipation of pain, required max assist for all aspects of bed mobility   Transfers  Overall transfer level Needs assistance  Equipment used Rolling walker (2 wheeled)  Transfers Sit to/from Stand  Sit to Stand Mod assist; VC for technique, hand placement and safety, mod assist to rise   Ambulation/Gait  Ambulation/Gait assistance  Min Guard; VC for gait sequence and upright posture, short antalgic steps, chair push behind for safety and to increase Pt confidence    Ambulation Distance (Feet) 15 Feet  Assistive device Rolling walker (2 wheeled)  Gait velocity interpretation Below normal speed for age/gender  Balance  Overall balance assessment  Dependent on UE support for standing balance  Exercises  Exercises   PT - End of Session  Equipment Utilized During Treatment Gait belt  Activity Tolerance Patient limited by fatigue;Patient limited by pain  Patient left in chair;with call bell/phone within reach  PT Assessment  PT Recommendation/Assessment Patient needs continued PT services  PT Visit Diagnosis Muscle weakness (generalized) (M62.81);Other  abnormalities of gait and mobility (R26.89);Pain;History of falling (Z91.81)  Pain - Right/Left Right  Pain - part of body Shoulder;Hip (Pain predominantly in R hip, some discomfort in R shoulder)  PT Problem List Decreased activity tolerance;Decreased mobility;Decreased strength;Pain  PT Plan  PT Frequency (ACUTE ONLY) Min 6X/week  PT Treatment/Interventions (ACUTE ONLY) Gait training;Stair training;Functional mobility training;Therapeutic exercise;Balance training;DME instruction;Patient/family education;Therapeutic activities  AM-PAC PT "6 Clicks" Daily Activity Outcome Measure  Difficulty turning over in bed (including adjusting bedclothes, sheets and blankets)? 3  Difficulty moving from lying on back to sitting on the side of the bed?  1  Difficulty sitting down on and standing up from a chair with arms (e.g., wheelchair, bedside commode, etc,.)? 2  Help needed moving to and from a bed to chair (including a wheelchair)? 2  Help needed walking in hospital room? 3  Help needed climbing 3-5 steps with a railing?  2  6 Click Score 13  Mobility G Code  CK  Acute Rehab PT Goals  PT Goal Formulation With patient  Written Expression  Dominant Hand Right       08/28/17 1300  PT Time Calculation  PT Start Time (ACUTE ONLY) 1210  PT Stop Time (ACUTE ONLY) 1255  PT Time Calculation (min) (ACUTE ONLY) 45 min  PT General Charges  $$ ACUTE PT VISIT 1 Visit  PT Evaluation  $PT Eval Moderate Complexity 1 Mod    Tatiana Paz, SPT  Acute Rehab  Office #: [phone]   I was present during the PT session and agree with patient status and findings as outlined by Kyla Balzarine, SPT.  Van Clines, [place]  Acute Rehabilitation Services Pager [phone] Office [phone]

## 2017-08-28 NOTE — Plan of Care (Deleted)
Pt would benefit form skilled OT services to address impairments in strength, balance and endurance to improve safety and function with ADLs and ADL mobility

## 2017-08-28 NOTE — Progress Notes (Addendum)
Initial Nutrition Assessment  DOCUMENTATION CODES:   Non-severe (moderate) malnutrition in context of chronic illness  INTERVENTION:    Ensure Enlive po BID, each supplement provides 350 kcal and 20 grams of protein  NUTRITION DIAGNOSIS:   Moderate Malnutrition related to chronic illness(polymyositis, interstitial lung disease) as evidenced by moderate fat depletion, moderate muscle depletion, percent weight loss (12% in < 6 months)  GOAL:   Patient will meet greater than or equal to 90% of their needs  MONITOR:   PO intake, Supplement acceptance, Labs, Weight trends, Skin  REASON FOR ASSESSMENT:   Consult Assessment of nutrition requirement/status  ASSESSMENT:   56 y.o. Female with PMH significant of diet controlled diabetes, rheumatoid arthritis, polymyositis, PVD, interstitial lung disease, anemia, GERD, depression, who presents with pain in right shoulder, right hip and right knee after fall.  RD spoke with patient at bedside. Reports a good appetite and that she was eating well PTA. PO intake 75% per flowsheet records.  Medications reviewed and include Lovaza, MVI and Reglan. Labs reviewed. Hgb 7.4 (L). HCT 23.2 (L). CBG's 129-135-129.  Pt amenable to chocolate Ensure Enlive supplements during acute hospitalization.  Reports no weight loss, however, readings below reveal a 12% loss since May 2018.  Severe for time frame.  NUTRITION - FOCUSED PHYSICAL EXAM:    Most Recent Value  Orbital Region  Moderate depletion  Upper Arm Region  Moderate depletion  Thoracic and Lumbar Region  Moderate depletion  Buccal Region  Moderate depletion  Temple Region  Moderate depletion  Clavicle Bone Region  Moderate depletion  Clavicle and Acromion Bone Region  Moderate depletion  Scapular Bone Region  Moderate depletion  Dorsal Hand  Moderate depletion  Patellar Region  Moderate depletion  Anterior Thigh Region  Moderate depletion  Posterior Calf Region  Moderate depletion   Edema (RD Assessment)  None    Diet Order:  Diet regular Room service appropriate? Yes; Fluid consistency: Thin  EDUCATION NEEDS:   No education needs have been identified at this time  Skin:  Skin Assessment: Skin Integrity Issues: Skin Integrity Issues:: Other (Comment) Other: full thickness wounds to L axilla, L breast and buttocks/sacrum  Last BM:  11/3  Height:   Ht Readings from Last 1 Encounters:  08/28/17 5\' 1"  (1.549 m)   Weight:   Wt Readings from Last 1 Encounters:  08/28/17 111 lb (50.3 kg)   Wt Readings from Last 10 Encounters:  08/28/17 111 lb (50.3 kg)  07/13/17 111 lb (50.3 kg)  06/29/17 111 lb (50.3 kg)  05/17/17 115 lb (52.2 kg)  05/11/17 115 lb (52.2 kg)  03/01/17 127 lb 4.8 oz (57.7 kg)  12/02/15 127 lb 5 oz (57.7 kg)  11/30/15 127 lb 4.8 oz (57.7 kg)  11/12/15 130 lb (59 kg)  10/24/15 130 lb (59 kg)   Ideal Body Weight:  47.7 kg  BMI:  Body mass index is 20.97 kg/m.  Estimated Nutritional Needs:   Kcal:  1500-1700  Protein:  70-85 gm  Fluid:  1.5-1.7 L  10/26/15, RD, LDN Pager #: [phone] After-Hours Pager #: [phone]

## 2017-08-28 NOTE — Progress Notes (Signed)
PROGRESS NOTE  Allison Beasley HEN:[id number] DOB: 1961-03-28 DOA: 08/26/2017 PCP: Alain Honey, MD   LOS: 1 day   Brief Narrative / Interim history: Allison Beasley is a 56 y.o. female with medical history significant of diet controlled diabetes, rheumatoid arthritis, polymyositis, PVD, interstitial lung disease, anemia, GERD, depression, who presents with pain in right shoulder, right hip and right knee after fall.  Assessment & Plan: Principal Problem:   Closed right hip fracture (HCC) Active Problems:   Polymyositis (HCC)   GERD (gastroesophageal reflux disease)   Interstitial lung disease (HCC)   RA (rheumatoid arthritis) (HCC)   Depression   Closed nondisplaced fracture of lateral end of right clavicle   Fall and closed right hip fracture and closed nondisplaced fracture of lateral end of right clavicle  -Orthopedic surgery consulted, patient was taken to the operating room on 11/4 and is status post right total hip arthroplasty by Dr. Magnus Ivan.  Appreciate input.  DVT prophylaxis per orthopedic surgery. -PT to evaluate today  Acute blood loss anemia -Likely in the setting of surgery on 11/4, transfuse a unit of packed red blood cells today  RA and polymyositis  -Patient used to be on prednisone and tacrolimus. Per her daughter, tacrolimus was recently discontinued. 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  This is stable currently.  Left upper extremity wound -Followed by wound care at [facility].  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 -Wound care consulted, appreciate input  ILD -Patient does not have respiratory distress, no chest pain, shortness of breath or cough. -When necessary albuterol nebulizers  Gerd -pepcid  Depression: -continue home medication:  Cymbalta  Thrombocytopenia -Patient has been having episodes of thrombocytopenia in the past, this is well known to family.  -Slightly improved today, no bleeding, continue to monitor  HTN -Continue metoprolol -IVF hydralazine when necessary -Blood pressure stable 107/60 morning   DVT prophylaxis: SCD Code Status: Full code Family Communication: No family at bedside today Disposition Plan: SNF 2-3 days  Consultants:   Orthopedic surgery  Procedures:  Right total hip arthroplasty through direct anterior approach -Dr. Magnus Ivan, 08/27/2018  Antimicrobials:  Ancef preop  Zyvox prior to hospitalization  Subjective: -She complains of "soreness" in her hip area.  No chest pain, no abdominal pain, no nausea or vomiting  Objective: Vitals:   08/27/17 1800 08/27/17 2006 08/28/17 0445 08/28/17 0950  BP: 105/71 104/72 101/66 107/60  Pulse: 96 94 87 (!) 107  Resp:      Temp: 99.2 F (37.3 C) 99.3 F (37.4 C) 99.4 F (37.4 C)   TempSrc: Oral Oral Oral   SpO2: 100% 100% 100%     Intake/Output Summary (Last 24 hours) at 08/28/2017 1223 Last data filed at 08/28/2017 0830 Gross per 24 hour  Intake 2201.25 ml  Output 1200 ml  Net 1001.25 ml   There were no vitals filed for this visit.  Examination:  Constitutional: NAD, calm, comfortable Eyes: lids and conjunctivae normal ENMT: Mucous membranes are moist.  Respiratory: clear to auscultation bilaterally, no wheezing, no crackles.  Cardiovascular: Regular rate and rhythm, no murmurs / rubs / gallops.  Abdomen: no tenderness. Bowel sounds positive.  Neurologic: non focal   Data Reviewed: I have independently reviewed following labs and imaging studies   CBC: Recent Labs  Lab 08/26/17 2300 08/28/17  0520  WBC 10.5 9.6  NEUTROABS 4.9 5.8  HGB 11.4* 7.4*  HCT 35.3* 23.2*  MCV 85.7 86.6  PLT 68* 98*   Basic Metabolic Panel: Recent Labs  Lab 08/26/17 2300 08/28/17 0520  NA 131* 140  K 3.8 4.5  CL 101 108   CO2 20* 24  GLUCOSE 108* 128*  BUN 14 21*  CREATININE 0.85 0.83  CALCIUM 8.2* 7.9*   GFR: CrCl cannot be calculated (Unknown ideal weight.). Liver Function Tests: No results for input(s): AST, ALT, ALKPHOS, BILITOT, PROT, ALBUMIN in the last 168 hours. No results for input(s): LIPASE, AMYLASE in the last 168 hours. No results for input(s): AMMONIA in the last 168 hours. Coagulation Profile: Recent Labs  Lab 08/26/17 2300  INR 0.97   Cardiac Enzymes: No results for input(s): CKTOTAL, CKMB, CKMBINDEX, TROPONINI in the last 168 hours. BNP (last 3 results) No results for input(s): PROBNP in the last 8760 hours. HbA1C: No results for input(s): HGBA1C in the last 72 hours. CBG: Recent Labs  Lab 08/27/17 0823 08/27/17 1151 08/27/17 1339  GLUCAP 129* 135* 129*   Lipid Profile: No results for input(s): CHOL, HDL, LDLCALC, TRIG, CHOLHDL, LDLDIRECT in the last 72 hours. Thyroid Function Tests: No results for input(s): TSH, T4TOTAL, FREET4, T3FREE, THYROIDAB in the last 72 hours. Anemia Panel: No results for input(s): VITAMINB12, FOLATE, FERRITIN, TIBC, IRON, RETICCTPCT in the last 72 hours. Urine analysis:    Component Value Date/Time   COLORURINE YELLOW 01/31/2017 1035   APPEARANCEUR CLOUDY (A) 01/31/2017 1035   LABSPEC 1.020 01/31/2017 1035   PHURINE 5.5 01/31/2017 1035   GLUCOSEU NEGATIVE 01/31/2017 1035   HGBUR LARGE (A) 01/31/2017 1035   BILIRUBINUR NEGATIVE 01/31/2017 1035   KETONESUR NEGATIVE 01/31/2017 1035   PROTEINUR 30 (A) 01/31/2017 1035   UROBILINOGEN 0.2 12/24/2012 0705   NITRITE POSITIVE (A) 01/31/2017 1035   LEUKOCYTESUR LARGE (A) 01/31/2017 1035   Sepsis Labs: Invalid input(s): PROCALCITONIN, LACTICIDVEN  Recent Results (from the past 240 hour(s))  Surgical pcr screen     Status: None   Collection Time: 08/27/17  3:36 AM  Result Value Ref Range Status   MRSA, PCR NEGATIVE NEGATIVE Final   Staphylococcus aureus NEGATIVE NEGATIVE Final    Comment:  (NOTE) The Xpert SA Assay (FDA approved for NASAL specimens in patients 73 years of age and older), is one component of a comprehensive surveillance program. It is not intended to diagnose infection nor to guide or monitor treatment.       Radiology Studies: Dg Chest 1 View  Result Date: 08/27/2017 CLINICAL DATA:  Larey Seat downstairs tonight. EXAM: CHEST 1 VIEW COMPARISON:  Chest radiograph February 01, 2017 FINDINGS: Cardiac silhouette is upper limits of normal in size. Calcified aortic knob. Mild chronic interstitial changes without pleural effusion or focal consolidation. LEFT ear and a RIGHT bibasilar suspected fibrosis. Mildly elevated RIGHT hemidiaphragm. No pneumothorax. Osteopenia. Acute RIGHT distal clavicle fracture. Extensive soft tissue calcifications. IMPRESSION: Borderline cardiomegaly. Mild chronic interstitial changes and bibasilar suspected fibrosis. Acute RIGHT distal clavicle fracture. 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 Electronically Signed   By: Awilda Metro M.D.   On: 08/27/2017 00:19   Dg Lumbar Spine Complete  Result Date: 08/27/2017 CLINICAL DATA:  Larey Seat downstairs tonight. EXAM: LUMBAR SPINE - COMPLETE 4+ VIEW COMPARISON:  Abdominal radiograph January 22, 2017 FINDINGS: There is no evidence of lumbar spine fracture. Alignment is normal. Osteopenia. Intervertebral disc spaces are maintained. Extensive soft tissue calcifications, limiting assessment of the lumbosacral junction  on frontal radiograph. IMPRESSION: No acute fracture deformity or malalignment. 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 Electronically Signed   By: Awilda Metro M.D.   On: 08/27/2017 00:17   Dg Shoulder Right  Result Date: 08/27/2017 CLINICAL DATA:  Larey Seat downstairs tonight. EXAM: RIGHT SHOULDER - 2+ VIEW COMPARISON:  Chest radiograph February 01, 2017 FINDINGS: Distal clavicle now projects above the acromion, with acute  suspected distal clavicle fracture. Osteopenia without destructive bony lesions. Humeral head is located. Extensive soft tissue calcifications. IMPRESSION: Probable acute distal clavicle displaced fracture. 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 Electronically Signed   By: Awilda Metro M.D.   On: 08/27/2017 00:16   Ct Head Wo Contrast  Result Date: 08/27/2017 CLINICAL DATA:  Fall going up brick steps. EXAM: CT HEAD WITHOUT CONTRAST CT CERVICAL SPINE WITHOUT CONTRAST TECHNIQUE: Multidetector CT imaging of the head and cervical spine was performed following the standard protocol without intravenous contrast. Multiplanar CT image reconstructions of the cervical spine were also generated. COMPARISON:  None. FINDINGS: CT HEAD FINDINGS Brain: Mild generalized atrophy, moderate chronic small vessel ischemia. No intracranial hemorrhage, mass effect, or midline shift. Basal gangliar calcifications, typically incidental. No hydrocephalus. The basilar cisterns are patent. No evidence of territorial infarct or acute ischemia. No extra-axial or intracranial fluid collection. Vascular: Atherosclerosis of skullbase vasculature without hyperdense vessel or abnormal calcification. Skull: No fracture or focal lesion. Sinuses/Orbits: Paranasal sinuses and mastoid air cells are clear. The visualized orbits are unremarkable. Other: None. CT CERVICAL SPINE FINDINGS Alignment: Normal. Skull base and vertebrae: No acute fracture. Vertebral body heights are maintained. The dens and skull base are intact. Soft tissues and spinal canal: No prevertebral fluid or swelling. No visible canal hematoma. Disc levels:  Minimal C4-C5 disc space narrowing. Upper chest: Right distal clavicle fracture with associated soft tissue edema, partially included. Breathing motion artifact, probable emphysema. Other: Carotid calcifications. IMPRESSION: 1.  No acute intracranial abnormality.  No skull fracture. 2. No  fracture or subluxation of the cervical spine. 3. Distal right clavicle fracture, partially included, previously characterized on shoulder radiograph. Electronically Signed   By: Rubye Oaks M.D.   On: 08/27/2017 01:36   Ct Cervical Spine Wo Contrast  Result Date: 08/27/2017 CLINICAL DATA:  Fall going up brick steps. EXAM: CT HEAD WITHOUT CONTRAST CT CERVICAL SPINE WITHOUT CONTRAST TECHNIQUE: Multidetector CT imaging of the head and cervical spine was performed following the standard protocol without intravenous contrast. Multiplanar CT image reconstructions of the cervical spine were also generated. COMPARISON:  None. FINDINGS: CT HEAD FINDINGS Brain: Mild generalized atrophy, moderate chronic small vessel ischemia. No intracranial hemorrhage, mass effect, or midline shift. Basal gangliar calcifications, typically incidental. No hydrocephalus. The basilar cisterns are patent. No evidence of territorial infarct or acute ischemia. No extra-axial or intracranial fluid collection. Vascular: Atherosclerosis of skullbase vasculature without hyperdense vessel or abnormal calcification. Skull: No fracture or focal lesion. Sinuses/Orbits: Paranasal sinuses and mastoid air cells are clear. The visualized orbits are unremarkable. Other: None. CT CERVICAL SPINE FINDINGS Alignment: Normal. Skull base and vertebrae: No acute fracture. Vertebral body heights are maintained. The dens and skull base are intact. Soft tissues and spinal canal: No prevertebral fluid or swelling. No visible canal hematoma. Disc levels:  Minimal C4-C5 disc space narrowing. Upper chest: Right distal clavicle fracture with associated soft tissue edema, partially included. Breathing motion artifact, probable emphysema. Other: Carotid calcifications. IMPRESSION: 1.  No acute intracranial abnormality.  No skull fracture. 2. No fracture or subluxation of the cervical  spine. 3. Distal right clavicle fracture, partially included, previously  characterized on shoulder radiograph. Electronically Signed   By: Rubye Oaks M.D.   On: 08/27/2017 01:36   Dg C-arm 1-60 Min  Result Date: 08/27/2017 CLINICAL DATA:  Operative imaging for right hip arthroplasty. EXAM: OPERATIVE RIGHT HIP (WITH PELVIS IF PERFORMED) 3 VIEWS TECHNIQUE: Fluoroscopic spot image(s) were submitted for interpretation post-operatively. COMPARISON:  None. FINDINGS: New total right hip arthroplasty appears well seated and aligned on the provided views. No evidence of acute fracture or operative complication. IMPRESSION: Well-positioned right hip total arthroplasty. Electronically Signed   By: Amie Portland M.D.   On: 08/27/2017 14:14   Dg Hip Operative Unilat W Or W/o Pelvis Right  Result Date: 08/27/2017 CLINICAL DATA:  Operative imaging for right hip arthroplasty. EXAM: OPERATIVE RIGHT HIP (WITH PELVIS IF PERFORMED) 3 VIEWS TECHNIQUE: Fluoroscopic spot image(s) were submitted for interpretation post-operatively. COMPARISON:  None. FINDINGS: New total right hip arthroplasty appears well seated and aligned on the provided views. No evidence of acute fracture or operative complication. IMPRESSION: Well-positioned right hip total arthroplasty. Electronically Signed   By: Amie Portland M.D.   On: 08/27/2017 14:14   Dg Hip Unilat With Pelvis 2-3 Views Right  Result Date: 08/27/2017 CLINICAL DATA:  Larey Seat downstairs tonight. EXAM: DG HIP (WITH OR WITHOUT PELVIS) 2-3V RIGHT COMPARISON:  Abdominal radiograph January 18, 2017 FINDINGS: Acute RIGHT femoral neck fracture with impaction, slight internal rotation of the distal bony fragments. No dislocation. Osteopenia. Limited assessment due to extensive soft tissue calcifications. Severe vascular calcifications. IMPRESSION: Acute RIGHT femoral neck fracture.  No dislocation. 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   On: 08/27/2017 00:14   Dg Knee 3  View Right  Result Date: 08/27/2017 CLINICAL DATA:  Fall down stairs tonight.  Right hip fracture. EXAM: RIGHT KNEE - 3 VIEW COMPARISON:  None. FINDINGS: No acute fracture or subluxation. The alignment and joint spaces are maintained. Trace peripheral spurring. No joint effusion. The bones are under mineralized. Advanced vascular calcifications. Scattered soft tissue calcifications. IMPRESSION: No acute fracture or subluxation of the right knee. Electronically Signed   By: Rubye Oaks M.D.   On: 08/27/2017 01:07     Scheduled Meds: . amLODipine  5 mg Oral Daily  . aspirin  81 mg Oral BID  . cholecalciferol  1,000 Units Oral Daily  . famotidine  20 mg Oral BID  . hydrocerin  1 application Topical Daily  . linezolid  600 mg Oral Q12H  . Melatonin  3 mg Oral QHS  . metoprolol tartrate  25 mg Oral BID  . multivitamin with minerals  1 tablet Oral Daily  . omega-3 acid ethyl esters  1 g Oral Daily  . predniSONE  5 mg Oral Q breakfast  . risperiDONE  0.5 mg Oral QHS  . senna-docusate  1 tablet Oral QHS   Continuous Infusions: . sodium chloride 75 mL/hr at 08/27/17 0825  . sodium chloride Stopped (08/27/17 1902)  . sodium chloride    . lactated ringers      Pamella Pert, MD, PhD [facility] Pager [phone]  If 7PM-7AM, please contact night-coverage [email or web address] Password [facility] 08/28/2017, 12:23 PM

## 2017-08-28 NOTE — Progress Notes (Signed)
Subjective: 1 Day Post-Op Procedure(s) (LRB): TOTAL HIP ARTHROPLASTY ANTERIOR APPROACH (Right) Patient reports pain as moderate.  Acute blood loss anemia from her fracture and surgery.  Objective: Vital signs in last 24 hours: Temp:  [97.5 F (36.4 C)-99.4 F (37.4 C)] 99.4 F (37.4 C) (11/05 0445) Pulse Rate:  [82-96] 87 (11/05 0445) Resp:  [7-16] 16 (11/04 1300) BP: (101-130)/(66-82) 101/66 (11/05 0445) SpO2:  [96 %-100 %] 100 % (11/05 0445)  Intake/Output from previous day: 11/04 0701 - 11/05 0700 In: 3096.5 [I.V.:2792.5; Blood:254; IV Piggyback:50] Out: 700 [Urine:500; Blood:200] Intake/Output this shift: No intake/output data recorded.  Recent Labs    08/26/17 2300 08/28/17 0520  HGB 11.4* 7.4*   Recent Labs    08/26/17 2300 08/28/17 0520  WBC 10.5 PENDING  RBC 4.12 2.68*  HCT 35.3* 23.2*  PLT 68* PENDING   Recent Labs    08/26/17 2300 08/28/17 0520  NA 131* 140  K 3.8 4.5  CL 101 108  CO2 20* 24  BUN 14 21*  CREATININE 0.85 0.83  GLUCOSE 108* 128*  CALCIUM 8.2* 7.9*   Recent Labs    08/26/17 2300  INR 0.97    Sensation intact distally Intact pulses distally Dorsiflexion/Plantar flexion intact Incision: scant drainage  Assessment/Plan: 1 Day Post-Op Procedure(s) (LRB): TOTAL HIP ARTHROPLASTY ANTERIOR APPROACH (Right) Up with therapy - WBAT right hip, no precautions. Will likely need a transfusion - will defer to the primary team.  [name] 08/28/2017, 7:34 AM

## 2017-08-28 NOTE — Progress Notes (Signed)
Patient ID: Allison Beasley, female   DOB: 1961/06/20, 56 y.o.   MRN: [id number] Her right arm sling is only really needed for comfort.  It can be removed for therapy and she can attempt using a walker with WBAT thru her right UE.

## 2017-08-28 NOTE — Evaluation (Signed)
Occupational Therapy Evaluation Patient Details Name: Allison Beasley MRN: [id number] DOB: 1961-09-03 Today's Date: 08/28/2017    History of Present Illness Allison Beasley a 56 y.o.femalewith medical history significant ofdiet controlled diabetes, rheumatoid arthritis, polymyositis, PVD, interstitial lung disease, anemia, GERD, depression, who presents with pain in right shoulder, right hip and right knee after fall.   Clinical Impression   Pt with decline in function and safety with ADLs and ADL mobility with decreased strength, balance and endurance. Pt would benefit form skilled OT services to address impairments  to improve safety and function with ADLs and ADL mobility    Follow Up Recommendations  Home health OT;Supervision/Assistance - 24 hour    Equipment Recommendations  3 in 1 bedside commode    Recommendations for Other Services       Precautions / Restrictions Precautions Precautions: Other (comment) Precaution Comments: R hip fx, no precuations. R UE sling for comfort (WBAT) Restrictions Weight Bearing Restrictions: No Other Position/Activity Restrictions: WB as tolerated      Mobility Bed Mobility Overal bed mobility: Needs Assistance Bed Mobility: Rolling;Sidelying to Sit Rolling: Max assist;+2 for physical assistance Sidelying to sit: Max assist;+2 for safety/equipment;+2 for physical assistance          Transfers Overall transfer level: Needs assistance Equipment used: Rolling walker (2 wheeled) Transfers: Sit to/from Stand Sit to Stand: +2 physical assistance;Mod assist         General transfer comment: min A to transefer to recliner, cues for safety, correct hand placement, correct technique using RW during ambulation    Balance Overall balance assessment: Needs assistance Sitting-balance support: No upper extremity supported;Feet supported Sitting balance-Leahy Scale: Fair     Standing balance support: Bilateral upper extremity  supported;During functional activity Standing balance-Leahy Scale: Poor                             ADL either performed or assessed with clinical judgement   ADL Overall ADL's : Needs assistance/impaired     Grooming: Wash/dry hands;Wash/dry face;Min guard;Sitting   Upper Body Bathing: Minimal assistance;Sitting   Lower Body Bathing: Maximal assistance   Upper Body Dressing : Minimal assistance;Sitting   Lower Body Dressing: Total assistance   Toilet Transfer: Moderate assistance;Minimal assistance;+2 for physical assistance;RW;Ambulation;Cueing for safety;Cueing for sequencing Toilet Transfer Details (indicate cue type and reason): simulated to recliner Toileting- Clothing Manipulation and Hygiene: Maximal assistance;Sit to/from stand       Functional mobility during ADLs: Moderate assistance;Minimal assistance;Cueing for sequencing;Cueing for safety;Rolling walker       Vision Baseline Vision/History: Wears glasses Wears Glasses: Reading only Patient Visual Report: No change from baseline       Perception     Praxis      Pertinent Vitals/Pain Pain Assessment: 0-10 Faces Pain Scale: Hurts little more Pain Location: R hip  Pain Descriptors / Indicators: Grimacing;Guarding;Moaning Pain Intervention(s): Limited activity within patient's tolerance;Monitored during session;Repositioned;Ice applied     Hand Dominance Right   Extremity/Trunk Assessment Upper Extremity Assessment Upper Extremity Assessment: Generalized weakness;RUE deficits/detail RUE Deficits / Details: R UE sling for comfort, pain   Lower Extremity Assessment Lower Extremity Assessment: Defer to PT evaluation       Communication Communication Communication: No difficulties   Cognition Arousal/Alertness: Awake/alert Behavior During Therapy: WFL for tasks assessed/performed Overall Cognitive Status: Within Functional Limits for tasks assessed  General Comments       Exercises    Shoulder Instructions      Home Living Family/patient expects to be discharged to:: Private residence Living Arrangements: Spouse/significant other;Children Available Help at Discharge: Family;Personal care attendant Type of Home: House Home Access: Stairs to enter [employer] of Steps: 3 Entrance Stairs-Rails: Can reach both;Right Home Layout: One level     Bathroom Shower/Tub: Chief Strategy Officer: Standard     Home Equipment: Tub bench;Grab bars - tub/shower;Other (comment)   Additional Comments: will continue with aide assist 5 days/wk and have 24 hour sup/assist from husband and daughter      Prior Functioning/Environment Level of Independence: Needs assistance    ADL's / Homemaking Assistance Needed: has aide 4 hrs/day 5 days/wk for assist with LB ADLs, pericare and home mgt            OT Problem List: Decreased strength;Decreased activity tolerance;Decreased knowledge of use of DME or AE;Impaired balance (sitting and/or standing);Decreased coordination;Pain      OT Treatment/Interventions: Self-care/ADL training;DME and/or AE instruction;Therapeutic activities;Therapeutic exercise;Patient/family education    OT Goals(Current goals can be found in the care plan section) Acute Rehab OT Goals Patient Stated Goal: go home OT Goal Formulation: With patient Time For Goal Achievement: 09/04/17 Potential to Achieve Goals: Good ADL Goals Pt Will Perform Grooming: with supervision;with set-up;sitting Pt Will Perform Upper Body Bathing: with min guard assist;with supervision;with set-up;sitting Pt Will Perform Lower Body Bathing: with mod assist;sitting/lateral leans;with caregiver independent in assisting Pt Will Perform Upper Body Dressing: with min guard assist;with supervision;with set-up;sitting Pt Will Perform Lower Body Dressing: with max assist;with mod assist;with caregiver  independent in assisting;sitting/lateral leans;sit to/from stand Pt Will Transfer to Toilet: with min assist;ambulating;grab bars Pt Will Perform Toileting - Clothing Manipulation and hygiene: with min assist;sitting/lateral leans;sit to/from stand;with caregiver independent in assisting Pt Will Perform Tub/Shower Transfer: with min assist;with min guard assist;3 in 1;tub bench;with caregiver independent in assisting  OT Frequency: Min 2X/week   Barriers to D/C:    no barriers       Co-evaluation PT/OT/SLP Co-Evaluation/Treatment: Yes Reason for Co-Treatment: For patient/therapist safety   OT goals addressed during session: ADL's and self-care;Proper use of Adaptive equipment and DME      AM-PAC PT "6 Clicks" Daily Activity     Outcome Measure Help from another person eating meals?: None Help from another person taking care of personal grooming?: A Little Help from another person toileting, which includes using toliet, bedpan, or urinal?: A Lot Help from another person bathing (including washing, rinsing, drying)?: A Lot Help from another person to put on and taking off regular upper body clothing?: A Little Help from another person to put on and taking off regular lower body clothing?: Total 6 Click Score: 15   End of Session Equipment Utilized During Treatment: Gait belt;Rolling walker  Activity Tolerance: Patient limited by fatigue;Patient limited by pain Patient left: in chair;with call bell/phone within reach  OT Visit Diagnosis: Unsteadiness on feet (R26.81);History of falling (Z91.81);Muscle weakness (generalized) (M62.81);Pain Pain - Right/Left: Right Pain - part of body: Hip;Leg                Time: 3235-5732 OT Time Calculation (min): 37 min Charges:  OT General Charges $OT Visit: 1 Visit OT Evaluation $OT Eval Moderate Complexity: 1 Mod G-Codes: OT G-codes **NOT FOR INPATIENT CLASS** Functional Assessment Tool Used: AM-PAC 6 Clicks Daily Activity      Galen Manila 08/28/2017, 3:33  PM

## 2017-08-29 LAB — CBC
HEMATOCRIT: 27 % — AB (ref 36.0–46.0)
HEMOGLOBIN: 8.6 g/dL — AB (ref 12.0–15.0)
MCH: 27.8 pg (ref 26.0–34.0)
MCHC: 31.9 g/dL (ref 30.0–36.0)
MCV: 87.4 fL (ref 78.0–100.0)
Platelets: 74 10*3/uL — ABNORMAL LOW (ref 150–400)
RBC: 3.09 MIL/uL — ABNORMAL LOW (ref 3.87–5.11)
RDW: 15.6 % — ABNORMAL HIGH (ref 11.5–15.5)
WBC: 15.8 10*3/uL — AB (ref 4.0–10.5)

## 2017-08-29 LAB — BASIC METABOLIC PANEL
ANION GAP: 7 (ref 5–15)
BUN: 10 mg/dL (ref 6–20)
CALCIUM: 8.2 mg/dL — AB (ref 8.9–10.3)
CO2: 28 mmol/L (ref 22–32)
CREATININE: 0.67 mg/dL (ref 0.44–1.00)
Chloride: 104 mmol/L (ref 101–111)
GFR calc non Af Amer: >60 mL/min (ref >60)
GLUCOSE: 115 mg/dL — AB (ref 65–99)
Potassium: 4.3 mmol/L (ref 3.5–5.1)
Sodium: 139 mmol/L (ref 135–145)

## 2017-08-29 LAB — BPAM RBC
Blood Product Expiration Date: 201811222359
ISSUE DATE / TIME: 201811051318
UNIT TYPE AND RH: 7300

## 2017-08-29 LAB — TYPE AND SCREEN
ABO/RH(D): B POS
Antibody Screen: NEGATIVE
Unit division: 0

## 2017-08-29 MED ORDER — ASPIRIN EC 81 MG PO TBEC: 81.0000 mg | DELAYED_RELEASE_TABLET | Freq: Two times a day (BID) | ORAL | 0 refills | Status: AC

## 2017-08-29 MED ORDER — DULOXETINE HCL 30 MG PO CPEP
30.0000 mg | ORAL_CAPSULE | Freq: Every day | ORAL | Status: DC
  Administered 2017-08-30 – 2017-08-31 (×2): 30 mg via ORAL
  Filled 2017-08-29 (×2): qty 1

## 2017-08-29 MED ORDER — OXYCODONE-ACETAMINOPHEN 5-325 MG PO TABS: 1.0000 | ORAL_TABLET | ORAL | 0 refills | Status: AC | PRN

## 2017-08-29 NOTE — Anesthesia Postprocedure Evaluation (Signed)
Anesthesia Post Note  Patient: Allison Beasley  Procedure(s) Performed: TOTAL HIP ARTHROPLASTY ANTERIOR APPROACH (Right Hip)     Patient location during evaluation: PACU Anesthesia Type: General Level of consciousness: awake and patient cooperative Pain management: pain level controlled Vital Signs Assessment: post-procedure vital signs reviewed and stable Respiratory status: spontaneous breathing, nonlabored ventilation, respiratory function stable and patient connected to nasal cannula oxygen Cardiovascular status: blood pressure returned to baseline and stable Postop Assessment: no apparent nausea or vomiting Anesthetic complications: no    Last Vitals:  Vitals:   08/28/17 2021 08/29/17 0454  BP: 140/79 122/84  Pulse: 99 (!) 101  Resp: 16 17  Temp: 37.1 C 37.1 C  SpO2: 100% 99%    Last Pain:  Vitals:   08/29/17 0957  TempSrc:   PainSc: 2                  Kesa Birky

## 2017-08-29 NOTE — Progress Notes (Signed)
Physical Therapy Treatment Patient Details Name: Allison Beasley MRN: [id number] DOB: Feb 05, 1961 Today's Date: 08/29/2017    History of Present Illness Allison Beasley a 56 y.o.femalewith medical history significant ofdiet controlled diabetes, rheumatoid arthritis, polymyositis, PVD, interstitial lung disease, anemia, GERD, depression, who presents with pain in right shoulder, right hip and right knee after fall.    PT Comments    Pt was admitted with above diagnosis. Pt had R hip and shoulder pain, needed max assist supine to sit, mod assist sit to stand, and min assist while walking with RW. Pt would like to discharge home, but SNF must be considered for rehab to maximize independence and safety with mobility. Would like to see her progress more with functional mobility, including stairs and independent bed mobility before dc home. Pt would benefit from skilled PT to increase independence and safety with mobility.     Follow Up Recommendations  Supervision/Assistance - 24 hour;SNF     Equipment Recommendations  None recommended by PT(well equipped )    Recommendations for Other Services       Precautions / Restrictions Precautions Precaution Comments: R hip fx, no precuations. R UE sling for comfort (WBAT) Restrictions Weight Bearing Restrictions: No    Mobility  Bed Mobility Overal bed mobility: Needs Assistance Bed Mobility: Supine to Sit;Rolling Rolling: +2 for physical assistance;Max assist   Supine to sit: +2 for physical assistance;Max assist;+2 for safety/equipment        Transfers Overall transfer level: Needs assistance Equipment used: Rolling walker (2 wheeled) Transfers: Sit to/from Stand Sit to Stand: Mod assist;+2 safety/equipment;+2 physical assistance         General transfer comment: VC for technique; required max assist for supine to sit; sit to stand required mod assist, RW, VC for hand placement   Ambulation/Gait Ambulation/Gait assistance: Min  assist Ambulation Distance (Feet): 20 Feet(with seated rest break ) Assistive device: Rolling walker (2 wheeled) Gait Pattern/deviations: Decreased step length - right   Gait velocity interpretation: Below normal speed for age/gender General Gait Details: VC for gait sequence, hand placement on RW and upright posture; chair push behind for safety and to increase Pt confidence. Noted decreased R foot clearance improved with VC.    Stairs            Wheelchair Mobility    Modified Rankin (Stroke Patients Only)       Balance Overall balance assessment: Needs assistance Sitting-balance support: No upper extremity supported Sitting balance-Leahy Scale: Fair Sitting balance - Comments: (Assist for Pt confidence and safety )   Standing balance support: During functional activity;Bilateral upper extremity supported Standing balance-Leahy Scale: Fair                              Cognition Arousal/Alertness: Awake/alert Behavior During Therapy: WFL for tasks assessed/performed Overall Cognitive Status: Within Functional Limits for tasks assessed                                        Exercises Total Joint Exercises Ankle Circles/Pumps: 10 reps Quad Sets: 5 reps Heel Slides: 5 reps Hip ABduction/ADduction: 5 reps    General Comments        Pertinent Vitals/Pain Pain Assessment: Faces Faces Pain Scale: Hurts little more Pain Location: R hip  Pain Descriptors / Indicators: Grimacing;Heaviness;Burning Pain Intervention(s): Ice applied;Limited activity within patient's  tolerance;Monitored during session    Home Living                      Prior Function            PT Goals (current goals can now be found in the care plan section) Acute Rehab PT Goals Patient Stated Goal: go home PT Goal Formulation: With patient Progress towards PT goals: Progressing toward goals;PT to reassess next treatment    Frequency    Min  6X/week      PT Plan Current plan remains appropriate    Co-evaluation              AM-PAC PT "6 Clicks" Daily Activity  Outcome Measure  Difficulty turning over in bed (including adjusting bedclothes, sheets and blankets)?: A Little Difficulty moving from lying on back to sitting on the side of the bed? : Unable Difficulty sitting down on and standing up from a chair with arms (e.g., wheelchair, bedside commode, etc,.)?: A Lot Help needed moving to and from a bed to chair (including a wheelchair)?: A Little Help needed walking in hospital room?: A Little Help needed climbing 3-5 steps with a railing? : A Lot 6 Click Score: 14    End of Session Equipment Utilized During Treatment: Gait belt Activity Tolerance: Patient limited by pain;Patient limited by fatigue Patient left: in chair;with call bell/phone within reach   PT Visit Diagnosis: Muscle weakness (generalized) (M62.81);Pain;History of falling (Z91.81);Other abnormalities of gait and mobility (R26.89) Pain - Right/Left: Right Pain - part of body: Shoulder;Hip(Minor R shoulder pain )     Time: 0950-1030 PT Time Calculation (min) (ACUTE ONLY): 40 min  Charges:  $Gait Training: 23-37 mins $Therapeutic Activity: 8-22 mins                    G Codes:       Antony Salmon, SPT  Acute Rehab  Office #: [phone]   Antony Salmon 08/29/2017, 1:54 PM

## 2017-08-29 NOTE — Care Management Note (Addendum)
Case Management Note  Patient Details  Name: ADELEIGH BARLETTA MRN: [id number] Date of Birth: Mar 28, 1961  Subjective/Objective:    56 yr old female  Admitted s/p fall with right hip fracture , right clavicle fracture. Patient underwent right total hip arthroplasty, anterior approach. Clavicle fracture did not require surgical intervention.                Action/Plan: Case manager spoke with patient concerning discharge plan and DME. Patient asked that CM contact her daughter, Aggie Cosier [phone], to discuss Home Health agency. Patient was previously  setup with [facility]. Case manager contacted [facility], Norberta Keens [phone], faxed orders to her at [phone]. Patient says she has 24/7 family support at discharge.  Case manager spoke with Physical therapist Jeanice Lim, who says that patient may need shortterm rehab at [facility]. CM will give inform Crystal to be in contact with Case manager following for final disposition.    Expected Discharge Date:   pending                Expected Discharge Plan:  In-House Referral:     Discharge planning Services  CM Consult  Post Acute Care Choice:  Home Health Choice offered to:  Patient, Adult Children  DME Arranged:  N/A(has RW and 3in1) DME Agency:  NA  HH Arranged:  PT, OT, Nurse's Aide HH Agency:     Status of Service:  In process, will continue to follow  If discussed at Long Length of Stay Meetings, dates discussed:    Additional Comments:  Durenda Guthrie, RN 08/29/2017, 2:08 PM

## 2017-08-29 NOTE — Discharge Instructions (Signed)

## 2017-08-29 NOTE — Progress Notes (Signed)
Occupational Therapy Treatment Patient Details Name: Allison Beasley MRN: [id number] DOB: 1961-09-21 Today's Date: 08/29/2017    History of present illness Allison Beasley a 56 y.o.femalewith medical history significant ofdiet controlled diabetes, rheumatoid arthritis, polymyositis, PVD, interstitial lung disease, anemia, GERD, depression, who presents with pain in right shoulder, right hip and right knee after fall.   OT comments  Pt demonstrating some progress toward OT goals. She reports pain is increased today as compared to previous session. She continues to require significant assistance for bed mobility in preparation for ADL participation seated at EOB. Pt continues to require maximum assistance for LB ADL at this time. Feel pt would best benefit from SNF level rehabilitation post-acute D/C. Updated D/C recommendation appropriately.    Follow Up Recommendations  Supervision/Assistance - 24 hour;SNF    Equipment Recommendations  3 in 1 bedside commode    Recommendations for Other Services      Precautions / Restrictions Precautions Precautions: Other (comment) Precaution Comments: R hip fx, no precuations. R UE sling for comfort (WBAT) Required Braces or Orthoses: (use sling only as needed) Restrictions Weight Bearing Restrictions: No Other Position/Activity Restrictions: WB as tolerated       Mobility Bed Mobility Overal bed mobility: Needs Assistance Bed Mobility: Supine to Sit     Supine to sit: Max assist     General bed mobility comments: Max assist to move B LE to EOB.   Transfers                      Balance Overall balance assessment: Needs assistance Sitting-balance support: No upper extremity supported Sitting balance-Leahy Scale: Fair                                     ADL either performed or assessed with clinical judgement   ADL Overall ADL's : Needs assistance/impaired         Upper Body Bathing: Minimal  assistance;Sitting   Lower Body Bathing: Maximal assistance;Sitting/lateral leans   Upper Body Dressing : Minimal assistance;Sitting   Lower Body Dressing: Maximal assistance;Sitting/lateral leans                 General ADL Comments: Pt able to sit at EOB for approximately 10 minutes for ADL participation. Continues to require significant assistance for bed mobility.      Vision       Perception     Praxis      Cognition Arousal/Alertness: Awake/alert Behavior During Therapy: WFL for tasks assessed/performed Overall Cognitive Status: Within Functional Limits for tasks assessed                                          Exercises     Shoulder Instructions       General Comments      Pertinent Vitals/ Pain       Pain Assessment: Faces Faces Pain Scale: Hurts even more Pain Location: R hip  Pain Descriptors / Indicators: Grimacing;Heaviness;Burning Pain Intervention(s): Ice applied  Home Living                                          Prior Functioning/Environment  Frequency  Min 2X/week        Progress Toward Goals  OT Goals(current goals can now be found in the care plan section)  Progress towards OT goals: Progressing toward goals  Acute Rehab OT Goals Patient Stated Goal: go home OT Goal Formulation: With patient Time For Goal Achievement: 09/04/17 Potential to Achieve Goals: Good  Plan Discharge plan remains appropriate    Co-evaluation                 AM-PAC PT "6 Clicks" Daily Activity     Outcome Measure   Help from another person eating meals?: None Help from another person taking care of personal grooming?: A Little Help from another person toileting, which includes using toliet, bedpan, or urinal?: A Lot Help from another person bathing (including washing, rinsing, drying)?: A Lot Help from another person to put on and taking off regular upper body clothing?: A  Little Help from another person to put on and taking off regular lower body clothing?: A Lot 6 Click Score: 16    End of Session    OT Visit Diagnosis: Unsteadiness on feet (R26.81);History of falling (Z91.81);Muscle weakness (generalized) (M62.81);Pain Pain - Right/Left: Right Pain - part of body: Hip;Leg   Activity Tolerance Patient limited by pain   Patient Left in chair;with call bell/phone within reach   Nurse Communication (Nurse tech - pt needs pure whick replaced)        Time: 6073-7106 OT Time Calculation (min): 28 min  Charges: OT General Charges $OT Visit: 1 Visit OT Treatments $Self Care/Home Management : 23-37 mins  Doristine Section, MS OTR/L  Pager: [phone]    [name] A Ger Nicks 08/29/2017, 4:41 PM

## 2017-08-29 NOTE — Progress Notes (Signed)
PROGRESS NOTE  NAKESHIA Beasley STM:[id number] DOB: April 05, 1961 DOA: 08/26/2017 PCP: Alain Honey, MD   LOS: 2 days   Brief Narrative / Interim history: Allison Beasley is a 56 y.o. female with medical history significant of diet controlled diabetes, rheumatoid arthritis, polymyositis, PVD, interstitial lung disease, anemia, GERD, depression, who presents with pain in right shoulder, right hip and right knee after fall.  She is status post operative repair on 11/4.  Did require blood transfusion on 11/5 for acute blood loss anemia.  Assessment & Plan: Principal Problem:   Closed right hip fracture (HCC) Active Problems:   Polymyositis (HCC)   GERD (gastroesophageal reflux disease)   Interstitial lung disease (HCC)   RA (rheumatoid arthritis) (HCC)   Depression   Closed nondisplaced fracture of lateral end of right clavicle   Fall and closed right hip fracture and closed nondisplaced fracture of lateral end of right clavicle  -Orthopedic surgery consulted, patient was taken to the operating room on 11/4 and is status post right total hip arthroplasty by Dr. Magnus Ivan.  Appreciate input.  DVT prophylaxis per orthopedic surgery. -PT recommending SNF, discussed with patient's, she is not sure she wants to go that route but is considering it.  Consulted Child psychotherapist.  Acute blood loss anemia -Likely in the setting of surgery on 11/4, she was transfused a unit of packed red blood cells on 11/5 for hemoglobin of 7.4, improved to 8.6 this morning.  We will continue to monitor, repeat CBC tomorrow  RA and polymyositis  -Patient used to be on prednisone and tacrolimus. Per her daughter, tacrolimus was recently discontinued. 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  This is stable currently.  Left upper extremity wound -Followed by wound  care at [facility].  She has been placed on Zyvox for MRSA, resumed Zyvox while here, end date per pharmacy supposed to be 11/2, stop Zyvox 11/5 -Wound care consulted, appreciate input  ILD -Patient does not have respiratory distress, no chest pain, shortness of breath or cough. -When necessary albuterol nebulizers  Gerd -pepcid  Depression: -continue home medication: Cymbalta  Thrombocytopenia -Patient has been having episodes of thrombocytopenia in the past, this is well known to family.  -Stable, no bleeding.  Zyvox discontinued as above.  HTN -Continue metoprolol -IVF hydralazine when necessary -Blood pressure stable 122/84 this morning   DVT prophylaxis: Aspirin per orthopedic surgery Code Status: Full code Family Communication: No family at bedside today Disposition Plan: SNF 1-2 days  Consultants:   Orthopedic surgery  Procedures:  Right total hip arthroplasty through direct anterior approach -Dr. Magnus Ivan, 08/27/2018  Antimicrobials:  Ancef preop  Zyvox prior to hospitalization  Subjective: -Feels a little bit better today, less pain in her hip.  She was able to work with PT yesterday no chest pain, no abdominal pain, no nausea or vomiting  Objective: Vitals:   08/28/17 1354 08/28/17 1530 08/28/17 2021 08/29/17 0454  BP:  114/65 140/79 122/84  Pulse:  77 99 (!) 101  Resp:  16 16 17   Temp:  98.6 F (37 C) 98.8 F (37.1 C) 98.7 F (37.1 C)  TempSrc:  Oral Oral Oral  SpO2:  97% 100% 99%  Weight: 50.3 kg (111 lb)     Height: 5\' 1"  (1.549 m)       Intake/Output Summary (Last 24 hours) at 08/29/2017  1059 Last data filed at 08/29/2017 0515 Gross per 24 hour  Intake 526 ml  Output 1200 ml  Net -674 ml   Filed Weights   08/28/17 1354  Weight: 50.3 kg (111 lb)    Examination:  Constitutional: NAD Eyes: No scleral icterus ENMT: Moist mucous membranes Respiratory: Clear to auscultation bilaterally, no wheezing, no  crackles Cardiovascular: Regular rate and rhythm, no murmurs rubs or gallops.  No lower extremity edema. Abdomen: Soft, nontender to palpation, no guarding, bowel sounds positive Neurologic: Nonfocal, sensation intact and equal strength   Data Reviewed: I have independently reviewed following labs and imaging studies   CBC: Recent Labs  Lab 08/26/17 2300 08/28/17 0520 08/29/17 0628  WBC 10.5 9.6 15.8*  NEUTROABS 4.9 5.8  --   HGB 11.4* 7.4* 8.6*  HCT 35.3* 23.2* 27.0*  MCV 85.7 86.6 87.4  PLT 68* 98* 74*   Basic Metabolic Panel: Recent Labs  Lab 08/26/17 2300 08/28/17 0520 08/29/17 0628  NA 131* 140 139  K 3.8 4.5 4.3  CL 101 108 104  CO2 20* 24 28  GLUCOSE 108* 128* 115*  BUN 14 21* 10  CREATININE 0.85 0.83 0.67  CALCIUM 8.2* 7.9* 8.2*   GFR: Estimated Creatinine Clearance: 58.5 mL/min (by C-G formula based on SCr of 0.67 mg/dL). Liver Function Tests: No results for input(s): AST, ALT, ALKPHOS, BILITOT, PROT, ALBUMIN in the last 168 hours. No results for input(s): LIPASE, AMYLASE in the last 168 hours. No results for input(s): AMMONIA in the last 168 hours. Coagulation Profile: Recent Labs  Lab 08/26/17 2300  INR 0.97   Cardiac Enzymes: No results for input(s): CKTOTAL, CKMB, CKMBINDEX, TROPONINI in the last 168 hours. BNP (last 3 results) No results for input(s): PROBNP in the last 8760 hours. HbA1C: No results for input(s): HGBA1C in the last 72 hours. CBG: Recent Labs  Lab 08/27/17 0823 08/27/17 1151 08/27/17 1339  GLUCAP 129* 135* 129*   Lipid Profile: No results for input(s): CHOL, HDL, LDLCALC, TRIG, CHOLHDL, LDLDIRECT in the last 72 hours. Thyroid Function Tests: No results for input(s): TSH, T4TOTAL, FREET4, T3FREE, THYROIDAB in the last 72 hours. Anemia Panel: No results for input(s): VITAMINB12, FOLATE, FERRITIN, TIBC, IRON, RETICCTPCT in the last 72 hours. Urine analysis:    Component Value Date/Time   COLORURINE YELLOW 01/31/2017  1035   APPEARANCEUR CLOUDY (A) 01/31/2017 1035   LABSPEC 1.020 01/31/2017 1035   PHURINE 5.5 01/31/2017 1035   GLUCOSEU NEGATIVE 01/31/2017 1035   HGBUR LARGE (A) 01/31/2017 1035   BILIRUBINUR NEGATIVE 01/31/2017 1035   KETONESUR NEGATIVE 01/31/2017 1035   PROTEINUR 30 (A) 01/31/2017 1035   UROBILINOGEN 0.2 12/24/2012 0705   NITRITE POSITIVE (A) 01/31/2017 1035   LEUKOCYTESUR LARGE (A) 01/31/2017 1035   Sepsis Labs: Invalid input(s): PROCALCITONIN, LACTICIDVEN  Recent Results (from the past 240 hour(s))  Surgical pcr screen     Status: None   Collection Time: 08/27/17  3:36 AM  Result Value Ref Range Status   MRSA, PCR NEGATIVE NEGATIVE Final   Staphylococcus aureus NEGATIVE NEGATIVE Final    Comment: (NOTE) The Xpert SA Assay (FDA approved for NASAL specimens in patients 24 years of age and older), is one component of a comprehensive surveillance program. It is not intended to diagnose infection nor to guide or monitor treatment.       Radiology Studies: Dg C-arm 1-60 Min  Result Date: 08/27/2017 CLINICAL DATA:  Operative imaging for right hip arthroplasty. EXAM: OPERATIVE RIGHT HIP (WITH PELVIS IF  PERFORMED) 3 VIEWS TECHNIQUE: Fluoroscopic spot image(s) were submitted for interpretation post-operatively. COMPARISON:  None. FINDINGS: New total right hip arthroplasty appears well seated and aligned on the provided views. No evidence of acute fracture or operative complication. IMPRESSION: Well-positioned right hip total arthroplasty. Electronically Signed   By: Amie Portland M.D.   On: 08/27/2017 14:14   Dg Hip Operative Unilat W Or W/o Pelvis Right  Result Date: 08/27/2017 CLINICAL DATA:  Operative imaging for right hip arthroplasty. EXAM: OPERATIVE RIGHT HIP (WITH PELVIS IF PERFORMED) 3 VIEWS TECHNIQUE: Fluoroscopic spot image(s) were submitted for interpretation post-operatively. COMPARISON:  None. FINDINGS: New total right hip arthroplasty appears well seated and aligned on  the provided views. No evidence of acute fracture or operative complication. IMPRESSION: Well-positioned right hip total arthroplasty. Electronically Signed   By: Amie Portland M.D.   On: 08/27/2017 14:14     Scheduled Meds: . amLODipine  5 mg Oral Daily  . aspirin  81 mg Oral BID  . cholecalciferol  1,000 Units Oral Daily  . famotidine  20 mg Oral BID  . feeding supplement (ENSURE ENLIVE)  237 mL Oral BID BM  . hydrocerin  1 application Topical Daily  . Melatonin  3 mg Oral QHS  . metoprolol tartrate  25 mg Oral BID  . multivitamin with minerals  1 tablet Oral Daily  . omega-3 acid ethyl esters  1 g Oral Daily  . predniSONE  5 mg Oral Q breakfast  . risperiDONE  0.5 mg Oral QHS  . senna-docusate  1 tablet Oral QHS   Continuous Infusions: . sodium chloride 75 mL/hr at 08/27/17 0825  . sodium chloride Stopped (08/27/17 1902)  . lactated ringers      Pamella Pert, MD, PhD [facility] Pager [phone]  If 7PM-7AM, please contact night-coverage [email or web address] Password [email or web address] 08/29/2017, 10:59 AM

## 2017-08-29 NOTE — Progress Notes (Signed)
Subjective: 2 Days Post-Op Procedure(s) (LRB): TOTAL HIP ARTHROPLASTY ANTERIOR APPROACH (Right) Patient reports pain as moderate.  Working slowly with therapy on mobility.  Did receive a unit of blood yesterday due to acute on chronic anemia.  Objective: Vital signs in last 24 hours: Temp:  [98.1 F (36.7 C)-99.7 F (37.6 C)] 98.7 F (37.1 C) (11/06 0454) Pulse Rate:  [74-107] 101 (11/06 0454) Resp:  [16-18] 17 (11/06 0454) BP: (90-140)/(50-84) 122/84 (11/06 0454) SpO2:  [93 %-100 %] 99 % (11/06 0454) Weight:  [111 lb (50.3 kg)] 111 lb (50.3 kg) (11/05 1354)  Intake/Output from previous day: 11/05 0701 - 11/06 0700 In: 766 [P.O.:480; Blood:286] Out: 2400 [Urine:2400] Intake/Output this shift: No intake/output data recorded.  Recent Labs    08/26/17 2300 08/28/17 0520  HGB 11.4* 7.4*   Recent Labs    08/26/17 2300 08/28/17 0520  WBC 10.5 9.6  RBC 4.12 2.68*  HCT 35.3* 23.2*  PLT 68* 98*   Recent Labs    08/26/17 2300 08/28/17 0520  NA 131* 140  K 3.8 4.5  CL 101 108  CO2 20* 24  BUN 14 21*  CREATININE 0.85 0.83  GLUCOSE 108* 128*  CALCIUM 8.2* 7.9*   Recent Labs    08/26/17 2300  INR 0.97    Sensation intact distally Intact pulses distally Dorsiflexion/Plantar flexion intact Incision: dressing C/D/I  Assessment/Plan: 2 Days Post-Op Procedure(s) (LRB): TOTAL HIP ARTHROPLASTY ANTERIOR APPROACH (Right) Up with therapy  Discharge disposition per therapy recommendations (home vs SNF). 81 mg aspirin twice daily for DVT coverage.  Allison Beasley 08/29/2017, 7:34 AM

## 2017-08-29 NOTE — NC FL2 (Signed)
MEDICAID FL2 LEVEL OF CARE SCREENING TOOL     IDENTIFICATION  Patient Name: Allison Beasley Birthdate: 19-Nov-1960 Sex: female Admission Date (Current Location): 08/26/2017  [facility] and [place] Number:  Producer, television/film/video and Address:  [facility] [place], [address], [place], [place] [address]      Provider Number: [id number]  Attending Physician Name and Address:  Leatha Gilding, MD  Relative Name and Phone Number:     Peggyann Shoals, daughter, [phone]  Current Level of Care: Hospital Recommended Level of Care: Skilled Nursing Facility Prior Approval Number:    Date Approved/Denied:   PASRR Number: [id number]  Discharge Plan: SNF    Current Diagnoses: Patient Active Problem List   Diagnosis Date Noted  . Closed right hip fracture (HCC) 08/27/2017  . RA (rheumatoid arthritis) (HCC) 08/27/2017  . Depression 08/27/2017  . Closed fracture of neck of right femur (HCC)   . Closed nondisplaced fracture of lateral end of right clavicle   . Fall   . Knee pain, right   . Pressure injury of skin 05/19/2017  . PAD (peripheral artery disease) (HCC) 05/17/2017  . Abscess of right foot 10/27/2015  . Ulcer of foot (HCC) 08/19/2015  . Infection 07/21/2015  . Toe infection 05/19/2015  . Pain of great toe 05/07/2015  . Peripheral vascular disease, unspecified (HCC) 04/24/2014  . Pain of right lower extremity 04/10/2014  . Sensation of cold in leg- Right Leg and foot 04/10/2014  . Amputated great toe (HCC) 04/24/2013  . Open wound of great toe 03/21/2013  . Aftercare following surgery of the circulatory system, NEC 02/14/2013  . 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 Atherosclerosis of native arteries of the extremities with gangrene (HCC) 01/24/2013  . Dyspnea 01/13/2013  . Elevated troponin borderline -- no true elevation, resolved. 01/13/2013  . Raynaud's disease 01/13/2013  .  Polymyositis (HCC) 01/13/2013  . Fibromyalgia 01/13/2013  . Anemia, iron deficiency 10/25/2012  . GERD (gastroesophageal reflux disease) 07/30/2012  . Interstitial lung disease (HCC) 07/30/2012  . PNA (pneumonia) 04/09/2012  . Polymyositis associated with autoimmune disease (HCC) 08/15/2011    Orientation RESPIRATION BLADDER Height & Weight     Self, Time, Situation, Place  O2(Nasal Cannula 2L) Continent, Indwelling catheter Weight: 111 lb (50.3 kg) Height:  5\' 1"  (154.9 cm)  BEHAVIORAL SYMPTOMS/MOOD NEUROLOGICAL BOWEL NUTRITION STATUS      Continent Diet(See DC Summary)  AMBULATORY STATUS COMMUNICATION OF NEEDS Skin   Extensive Assist Verbally Surgical wounds                       Personal Care Assistance Level of Assistance  Dressing, Bathing, Feeding Bathing Assistance: Maximum assistance Feeding assistance: Limited assistance Dressing Assistance: Maximum assistance     Functional Limitations Info  Sight, Hearing, Speech Sight Info: Adequate Hearing Info: Adequate Speech Info: Adequate    SPECIAL CARE FACTORS FREQUENCY  PT (By licensed PT), OT (By licensed OT)     PT Frequency: 6x week OT Frequency: 2x week            Contractures Contractures Info: Not present    Additional Factors Info  Code Status, Allergies Code Status Info: Full code Allergies Info: CLINDAMYCIN/LINCOMYCIN, DOXYCYCLINE, PERCOCET OXYCODONE-ACETAMINOPHEN, CIPROFLOXACIN, MYCOPHENOLATE            Current Medications (08/29/2017):  This is the current hospital active medication list Current Facility-Administered Medications  Medication Dose Route Frequency Provider  Last Rate Last Dose  . 0.9 %  sodium chloride infusion   Intravenous Continuous Lorretta Harp, MD 75 mL/hr at 08/27/17 0825    . 0.9 %  sodium chloride infusion   Intravenous Continuous [name], MD   Stopped at 08/27/17 1902  . acetaminophen (TYLENOL) tablet 650 mg  650 mg Oral Q4H PRN [name],  MD   650 mg at 08/29/17 1226   Or  . acetaminophen (TYLENOL) suppository 650 mg  650 mg Rectal Q4H PRN [name], MD      . albuterol (PROVENTIL) (2.5 MG/3ML) 0.083% nebulizer solution 2.5 mg  2.5 mg Nebulization Q4H PRN Lorretta Harp, MD      . amLODipine (NORVASC) tablet 5 mg  5 mg Oral Daily Lorretta Harp, MD   5 mg at 08/29/17 0858  . aspirin chewable tablet 81 mg  81 mg Oral BID [name], MD   81 mg at 08/29/17 0857  . cholecalciferol (VITAMIN D) tablet 1,000 Units  1,000 Units Oral Daily Lorretta Harp, MD   1,000 Units at 08/29/17 0857  . diphenhydrAMINE (BENADRYL) capsule 25 mg  25 mg Oral Q8H PRN Leatha Gilding, MD   25 mg at 08/27/17 1725  . [START ON 08/30/2017] DULoxetine (CYMBALTA) DR capsule 30 mg  30 mg Oral Daily Leatha Gilding, MD      . famotidine (PEPCID) tablet 20 mg  20 mg Oral BID Lorretta Harp, MD   20 mg at 08/29/17 0857  . feeding supplement (ENSURE ENLIVE) (ENSURE ENLIVE) liquid 237 mL  237 mL Oral BID BM Leatha Gilding, MD   237 mL at 08/29/17 1500  . hydrALAZINE (APRESOLINE) injection 5 mg  5 mg Intravenous Q2H PRN Lorretta Harp, MD      . hydrocerin (EUCERIN) cream 1 application  1 application Topical Daily Lorretta Harp, MD   1 application at 08/29/17 0901  . HYDROcodone-acetaminophen (NORCO/VICODIN) 5-325 MG per tablet 1 tablet  1 tablet Oral Q4H PRN [name], MD   1 tablet at 08/28/17 [phone]  . HYDROmorphone (DILAUDID) injection 0.5 mg  0.5 mg Intravenous Q2H PRN [name], MD      . Influenza vac split quadrivalent PF (FLUARIX) injection 0.5 mL  0.5 mL Intramuscular Prior to discharge Leatha Gilding, MD      . lactated ringers infusion   Intravenous Continuous Val Eagle, MD      . Melatonin TABS 3 mg  3 mg Oral QHS Lorretta Harp, MD   3 mg at 08/28/17 2124  . menthol-cetylpyridinium (CEPACOL) lozenge 3 mg  1 lozenge Oral PRN [name], MD       Or  . phenol (CHLORASEPTIC) mouth spray 1 spray  1  spray Mouth/Throat PRN [name], MD      . methocarbamol (ROBAXIN) tablet 500 mg  500 mg Oral Q8H PRN Lorretta Harp, MD   500 mg at 08/29/17 1105  . metoCLOPramide (REGLAN) tablet 5-10 mg  5-10 mg Oral Q8H PRN [name], MD       Or  . metoCLOPramide (REGLAN) injection 5-10 mg  5-10 mg Intravenous Q8H PRN [name], MD      . metoprolol tartrate (LOPRESSOR) tablet 25 mg  25 mg Oral BID Lorretta Harp, MD   25 mg at 08/29/17 0857  . multivitamin with minerals tablet 1 tablet  1 tablet Oral Daily Lorretta Harp, MD   1 tablet at 08/29/17 0857  .  omega-3 acid ethyl esters (LOVAZA) capsule 1 g  1 g Oral Daily Lorretta Harp, MD   1 g at 08/29/17 0858  . ondansetron (ZOFRAN) tablet 4 mg  4 mg Oral Q6H PRN [name], MD       Or  . ondansetron [facility]) injection 4 mg  4 mg Intravenous Q6H PRN [name], MD      . oxyCODONE (Oxy IR/ROXICODONE) immediate release tablet 5 mg  5 mg Oral Q3H PRN [name], MD   5 mg at 08/29/17 1105  . predniSONE (DELTASONE) tablet 5 mg  5 mg Oral Q breakfast Lorretta Harp, MD   5 mg at 08/29/17 0858  . risperiDONE (RISPERDAL) tablet 0.5 mg  0.5 mg Oral QHS Lorretta Harp, MD   0.5 mg at 08/28/17 1955  . senna (SENOKOT) tablet 8.6 mg  1 tablet Oral Daily PRN Lorretta Harp, MD      . senna-docusate (Senokot-S) tablet 1 tablet  1 tablet Oral QHS Lorretta Harp, MD   1 tablet at 08/28/17 2124  . zolpidem (AMBIEN) tablet 5 mg  5 mg Oral QHS PRN Lorretta Harp, MD         Discharge Medications: Please see discharge summary for a list of discharge medications.  Relevant Imaging Results:  Relevant Lab Results:   Additional Information SS#:[id number]  Tresa Moore, LCSW

## 2017-08-29 NOTE — Plan of Care (Signed)
  Safety: Ability to remain free from injury will improve 08/29/2017 1314 - Progressing by Darrow Bussing, RN   Pain Managment: General experience of comfort will improve 08/29/2017 1314 - Progressing by Darrow Bussing, RN   Skin Integrity: Risk for impaired skin integrity will decrease 08/29/2017 1314 - Progressing by Darrow Bussing, RN   Bowel/Gastric: Will not experience complications related to bowel motility 08/29/2017 1314 - Progressing by Darrow Bussing, RN

## 2017-08-30 LAB — CBC
HCT: 27.2 % — ABNORMAL LOW (ref 36.0–46.0)
HEMOGLOBIN: 8.8 g/dL — AB (ref 12.0–15.0)
MCH: 27.8 pg (ref 26.0–34.0)
MCHC: 32.4 g/dL (ref 30.0–36.0)
MCV: 86.1 fL (ref 78.0–100.0)
PLATELETS: 83 10*3/uL — AB (ref 150–400)
RBC: 3.16 MIL/uL — AB (ref 3.87–5.11)
RDW: 15.5 % (ref 11.5–15.5)
WBC: 16.5 10*3/uL — AB (ref 4.0–10.5)

## 2017-08-30 NOTE — Progress Notes (Signed)
PROGRESS NOTE    Allison Beasley  YWV:[id number] DOB: 02/19/1961 DOA: 08/26/2017 PCP: Alain Honey, MD    Brief Narrative:  56 year old female who presented to with pain in right shoulder, right hip and right knee after sustaining a mechanical fall. Patient is known to have type 2 diabetes mellitus, rheumatoid arthritis, polymyositis, peripheral vascular disease, history of lung disease, anemia, depression and GERD. Patient had a mechanical fall while going down steps, landed on her right side, she developed severe pain at the right lower extremity to the point where she was not able to ambulate. On initial physical examination blood pressure 147/98, heart rate 101, respiratory rate 13, temperature 99.1. Dry mucous membranes, lungs were clear to auscultation bilaterally, heart S1-S2 present rhythmic, her abdomen was soft nontender, lower extremities with no edema, tenderness of the right shoulder and right hip. Further imaging showed acute distal clavicle fracture on the right. Acute right femoral neck fracture.   Patient was admitted to hospital working diagnosis acute right femoral neck fracture, acute right distal clavicle fracture.    Assessment & Plan:   Principal Problem:   Closed right hip fracture (HCC) Active Problems:   Polymyositis (HCC)   GERD (gastroesophageal reflux disease)   Interstitial lung disease (HCC)   RA (rheumatoid arthritis) (HCC)   Depression   Closed nondisplaced fracture of lateral end of right clavicle   1. Right hip fracture, right distal clavicle fracture. Pain well controlled with analgesics, will continue physical therapy evaluation, social services consulted for placement.   2. Acute blood loss anemia. Hb and hct stable, will continue close monitoring  3. Peripheral vascular disease. No claudication, will continue statin and antiplatelet therapy.   4. ILD. Dyspnea at baseline, will continue oxymetry monitoring  5. HTN. Continue blood pressure  monitoring   DVT prophylaxis: enoxaparin  Code Status: full Family Communication: no family at bedside Disposition Plan: home   Consultants:   orthopedics  Procedures:   Sp total hip arthroplasty  Antimicrobials:       Subjective: Patient feeling better, pain in right shoulder and hip controlled with analgesics, no nausea or vomiting, no dyspnea or chest pain.   Objective: Vitals:   08/28/17 2021 08/29/17 0454 08/29/17 1406 08/30/17 0500  BP: 140/79 122/84 129/75 127/79  Pulse: 99 (!) 101 (!) 101 96  Resp: 16 17 16    Temp: 98.8 F (37.1 C) 98.7 F (37.1 C) 99.6 F (37.6 C) 98.4 F (36.9 C)  TempSrc: Oral Oral Oral Oral  SpO2: 100% 99% 100%   Weight:      Height:        Intake/Output Summary (Last 24 hours) at 08/30/2017 1429 Last data filed at 08/30/2017 0830 Gross per 24 hour  Intake 480 ml  Output 900 ml  Net -420 ml   Filed Weights   08/28/17 1354  Weight: 50.3 kg (111 lb)    Examination:   General: Not in pain or dyspnea, deconditioned Neurology: Awake and alert, non focal  E ENT: no pallor, no icterus, oral mucosa moist Cardiovascular: No JVD. S1-S2 present, rhythmic, no gallops, rubs, or murmurs. No lower extremity edema. Pulmonary: vesicular breath sounds bilaterally, adequate air movement, no wheezing, rhonchi or rales. Gastrointestinal. Abdomen flat, no organomegaly, non tender, no rebound or guarding Skin. No rashes Musculoskeletal: no joint deformities     Data Reviewed: I have personally reviewed following labs and imaging studies  CBC: Recent Labs  Lab 08/26/17 2300 08/28/17 0520 08/29/17 0628 08/30/17 0430  WBC 10.5 9.6  15.8* 16.5*  NEUTROABS 4.9 5.8  --   --   HGB 11.4* 7.4* 8.6* 8.8*  HCT 35.3* 23.2* 27.0* 27.2*  MCV 85.7 86.6 87.4 86.1  PLT 68* 98* 74* 83*   Basic Metabolic Panel: Recent Labs  Lab 08/26/17 2300 08/28/17 0520 08/29/17 0628  NA 131* 140 139  K 3.8 4.5 4.3  CL 101 108 104  CO2 20* 24 28  GLUCOSE  108* 128* 115*  BUN 14 21* 10  CREATININE 0.85 0.83 0.67  CALCIUM 8.2* 7.9* 8.2*   GFR: Estimated Creatinine Clearance: 58.5 mL/min (by C-G formula based on SCr of 0.67 mg/dL). Liver Function Tests: No results for input(s): AST, ALT, ALKPHOS, BILITOT, PROT, ALBUMIN in the last 168 hours. No results for input(s): LIPASE, AMYLASE in the last 168 hours. No results for input(s): AMMONIA in the last 168 hours. Coagulation Profile: Recent Labs  Lab 08/26/17 2300  INR 0.97   Cardiac Enzymes: No results for input(s): CKTOTAL, CKMB, CKMBINDEX, TROPONINI in the last 168 hours. BNP (last 3 results) No results for input(s): PROBNP in the last 8760 hours. HbA1C: No results for input(s): HGBA1C in the last 72 hours. CBG: Recent Labs  Lab 08/27/17 0823 08/27/17 1151 08/27/17 1339  GLUCAP 129* 135* 129*   Lipid Profile: No results for input(s): CHOL, HDL, LDLCALC, TRIG, CHOLHDL, LDLDIRECT in the last 72 hours. Thyroid Function Tests: No results for input(s): TSH, T4TOTAL, FREET4, T3FREE, THYROIDAB in the last 72 hours. Anemia Panel: No results for input(s): VITAMINB12, FOLATE, FERRITIN, TIBC, IRON, RETICCTPCT in the last 72 hours.    Radiology Studies: I have reviewed all of the imaging during this hospital visit personally     Scheduled Meds: . amLODipine  5 mg Oral Daily  . aspirin  81 mg Oral BID  . cholecalciferol  1,000 Units Oral Daily  . DULoxetine  30 mg Oral Daily  . famotidine  20 mg Oral BID  . feeding supplement (ENSURE ENLIVE)  237 mL Oral BID BM  . hydrocerin  1 application Topical Daily  . Melatonin  3 mg Oral QHS  . metoprolol tartrate  25 mg Oral BID  . multivitamin with minerals  1 tablet Oral Daily  . omega-3 acid ethyl esters  1 g Oral Daily  . predniSONE  5 mg Oral Q breakfast  . risperiDONE  0.5 mg Oral QHS  . senna-docusate  1 tablet Oral QHS   Continuous Infusions: . sodium chloride 75 mL/hr at 08/27/17 0825  . sodium chloride Stopped (08/27/17  1902)  . lactated ringers       LOS: 3 days        Coralie Keens, MD [facility] Pager [phone]

## 2017-08-30 NOTE — Progress Notes (Signed)
Physical Therapy Treatment Patient Details Name: LUCILLA PETRENKO MRN: [id number] DOB: 1961/04/26 Today's Date: 08/30/2017    History of Present Illness Chimere Klingensmith Hartis a 56 y.o.femalewith medical history significant ofdiet controlled diabetes, rheumatoid arthritis, polymyositis, PVD, interstitial lung disease, anemia, GERD, depression, who presents with pain in right shoulder, right hip and right knee after fall.    PT Comments    Continuing work on functional mobility and activity tolerance; Took time to work through challenges of bed mobility with noted progress (see below); Improved activity tolerance from yesterday, with incr gait distance and able to practice sit<>stand on/off commode;   Ms. Greenwood still has concerns with managing at home, particularly with getting OOB urgently if needing to urinate; I continue to recommend post-acute rehab at SNF to maximize independence and safety with mobility and ADLs prior to dc home; Anticipate continuing good progress at post-acute rehabilitation.   Follow Up Recommendations  Supervision/Assistance - 24 hour;SNF     Equipment Recommendations  None recommended by PT(well equipped )    Recommendations for Other Services       Precautions / Restrictions Precautions Precautions: Other (comment) Precaution Comments: R hip fx, no precuations. R UE sling for comfort (WBAT) Required Braces or Orthoses: (use sling only as needed) Restrictions Weight Bearing Restrictions: No RUE Weight Bearing: Weight bearing as tolerated RLE Weight Bearing: Weight bearing as tolerated    Mobility  Bed Mobility Overal bed mobility: Needs Assistance Bed Mobility: Supine to Sit Rolling: Min assist;Mod assist         General bed mobility comments: Took extra tiem to problem-solve through getting to sit; From supine, Started with sliding RLE off of the bed, followed by clearing L heel from EOB with min assist; next reached bil UEs for rail on pt's L, getting  into semi-sidelie, then pt pushed with UEs (mostly L) up to sit with min assist; mod assist and use of bed pad to scoot hips to square off at EOB  Transfers Overall transfer level: Needs assistance Equipment used: Rolling walker (2 wheeled) Transfers: Sit to/from Stand Sit to Stand: Min assist         General transfer comment: Cues for hand placement and safety; min assist to rise; slow, but requiring less assist  Ambulation/Gait Ambulation/Gait assistance: Min assist Ambulation Distance (Feet): 35 Feet(to bathroom, then to and in hallway) Assistive device: Rolling walker (2 wheeled) Gait Pattern/deviations: Decreased step length - right;Decreased step length - left;Decreased stride length;Decreased weight shift to right;Trunk flexed   Gait velocity interpretation: Below normal speed for age/gender General Gait Details: Continued cues for hand placement on RW and for upright posture; Slow, short steps, still some foot clearance issues, but improved from last session   Stairs            Wheelchair Mobility    Modified Rankin (Stroke Patients Only)       Balance             Standing balance-Leahy Scale: Fair                              Cognition Arousal/Alertness: Awake/alert Behavior During Therapy: WFL for tasks assessed/performed Overall Cognitive Status: Within Functional Limits for tasks assessed  Exercises      General Comments        Pertinent Vitals/Pain Pain Assessment: 0-10 Pain Score: 8  Pain Location: R hip and R shoulder Pain Descriptors / Indicators: Grimacing Pain Intervention(s): Monitored during session;RN gave pain meds during session;Repositioned    Home Living                      Prior Function            PT Goals (current goals can now be found in the care plan section) Acute Rehab PT Goals Patient Stated Goal: go home PT Goal Formulation: With  patient Progress towards PT goals: Progressing toward goals    Frequency    Min 3X/week      PT Plan Current plan remains appropriate;Frequency needs to be updated    Co-evaluation              AM-PAC PT "6 Clicks" Daily Activity  Outcome Measure  Difficulty turning over in bed (including adjusting bedclothes, sheets and blankets)?: A Lot Difficulty moving from lying on back to sitting on the side of the bed? : Unable Difficulty sitting down on and standing up from a chair with arms (e.g., wheelchair, bedside commode, etc,.)?: A Little Help needed moving to and from a bed to chair (including a wheelchair)?: A Little Help needed walking in hospital room?: A Little Help needed climbing 3-5 steps with a railing? : A Lot 6 Click Score: 14    End of Session Equipment Utilized During Treatment: Gait belt Activity Tolerance: Patient tolerated treatment well;Patient limited by pain Patient left: in chair;with call bell/phone within reach Nurse Communication: Mobility status PT Visit Diagnosis: Muscle weakness (generalized) (M62.81);Pain;History of falling (Z91.81);Other abnormalities of gait and mobility (R26.89) Pain - Right/Left: Right Pain - part of body: Shoulder;Hip     Time: 5809-9833 PT Time Calculation (min) (ACUTE ONLY): 34 min  Charges:  $Gait Training: 8-22 mins $Therapeutic Activity: 8-22 mins                    G Codes:       Van Clines, PT  Acute Rehabilitation Services Pager [phone] Office [phone]    Levi Aland 08/30/2017, 11:04 AM

## 2017-08-30 NOTE — Plan of Care (Signed)
  Safety: Ability to remain free from injury will improve 08/30/2017 1058 - Progressing by Darrow Bussing, RN   Physical Regulation: Ability to maintain clinical measurements within normal limits will improve 08/30/2017 1058 - Progressing by Darrow Bussing, RN   Skin Integrity: Risk for impaired skin integrity will decrease 08/30/2017 1058 - Progressing by Darrow Bussing, RN   Nutrition: Adequate nutrition will be maintained 08/30/2017 1058 - Progressing by Darrow Bussing, RN

## 2017-08-31 DIAGNOSIS — M3320 Polymyositis, organ involvement unspecified: Secondary | ICD-10-CM

## 2017-08-31 DIAGNOSIS — Z96641 Presence of right artificial hip joint: Secondary | ICD-10-CM

## 2017-08-31 DIAGNOSIS — J849 Interstitial pulmonary disease, unspecified: Secondary | ICD-10-CM

## 2017-08-31 MED ORDER — POLYETHYLENE GLYCOL 3350 17 G PO PACK: 17.0000 g | PACK | Freq: Two times a day (BID) | ORAL | 0 refills | Status: AC

## 2017-08-31 MED ORDER — POLYETHYLENE GLYCOL 3350 17 G PO PACK: 17.0000 g | PACK | Freq: Two times a day (BID) | ORAL | Status: DC

## 2017-08-31 MED ORDER — ENSURE ENLIVE PO LIQD: 237.0000 mL | Freq: Two times a day (BID) | ORAL | 0 refills | Status: AC

## 2017-08-31 NOTE — Discharge Summary (Signed)
Pt given discharge instructions and gone over with her, daughter present, answered any questions that she had to satisfaction. Daughter is taking pt home, has equipment with her. Pt is no distress at discharge.

## 2017-08-31 NOTE — Progress Notes (Signed)
Referral routed to [facility], patient has RW at home and 3/1 in room to take home. No other CM needs identified. CM signing off.

## 2017-08-31 NOTE — Progress Notes (Signed)
Occupational Therapy Treatment Patient Details Name: Allison Beasley MRN: [id number] DOB: [birth year] Today's Date: 08/31/2017    History of present illness Cooper Stamp Hartis a 56 y.o.femalewith medical history significant ofdiet controlled diabetes, rheumatoid arthritis, polymyositis, PVD, interstitial lung disease, anemia, GERD, depression, who presents with pain in right shoulder, right hip and right knee after fall.   OT comments  Pt making progress with functional goals. Pt plans to d/c home now vs SNF. Pt's daughter form [facility] present and asked many questions about assisting pt at home. Pt has another daughter that now lives with her that will be assisting her as well. OT will continue to follow acutely  Follow Up Recommendations  Supervision/Assistance - 24 hour;SNF(pt plans to return home now with assist from daughter from out of town and one that now lives with her)    Equipment Recommendations   3 in 1, tub bench, reacher   Recommendations for Other Services      Precautions / Restrictions Precautions Precaution Comments: R hip fx, no precuations. R UE sling for comfort (WBAT) Restrictions Weight Bearing Restrictions: No RUE Weight Bearing: Non weight bearing RLE Weight Bearing: Weight bearing as tolerated Other Position/Activity Restrictions: WB as tolerated       Mobility Bed Mobility Overal bed mobility: Needs Assistance Bed Mobility: Supine to Sit;Sit to Supine     Supine to sit: Mod assist Sit to supine: Mod assist   General bed mobility comments: mod A with elevating trunk to sit EOB and with LEs back onto bed. Required max A to scoot to [facility]  Transfers Overall transfer level: Needs assistance Equipment used: Rolling walker (2 wheeled) Transfers: Sit to/from Stand Sit to Stand: Min assist         General transfer comment: Cues for hand placement and safety    Balance Overall balance assessment: Needs assistance Sitting-balance support: No upper  extremity supported Sitting balance-Leahy Scale: Fair     Standing balance support: During functional activity;Bilateral upper extremity supported Standing balance-Leahy Scale: Fair                             ADL either performed or assessed with clinical judgement   ADL Overall ADL's : Needs assistance/impaired     Grooming: Wash/dry hands;Wash/dry face;Min guard;Standing   Upper Body Bathing: Min guard;Sitting Upper Body Bathing Details (indicate cue type and reason): simulated Lower Body Bathing: Sitting/lateral leans;Moderate assistance;With caregiver independent assisting Lower Body Bathing Details (indicate cue type and reason): simulated Upper Body Dressing : Sitting;Min guard   Lower Body Dressing: Sitting/lateral leans;Sit to/from stand;Moderate assistance;With caregiver independent assisting   Toilet Transfer: Minimal assistance;RW;Ambulation;Cueing for safety   Toileting- Clothing Manipulation and Hygiene: Sit to/from stand;Minimal assistance       Functional mobility during ADLs: Minimal assistance;Cueing for safety;Rolling walker       Vision Patient Visual Report: No change from baseline     Perception     Praxis      Cognition Arousal/Alertness: Awake/alert Behavior During Therapy: WFL for tasks assessed/performed Overall Cognitive Status: Within Functional Limits for tasks assessed                                          Exercises     Shoulder Instructions       General Comments  pt pleasant and cooperative  Pertinent Vitals/ Pain       Pain Assessment: 0-10 Pain Score: 5  Pain Location: R hip and R shoulder Pain Descriptors / Indicators: Grimacing;Sore Pain Intervention(s): Monitored during session;Repositioned;Premedicated before session  Home Living                                          Prior Functioning/Environment              Frequency  Min 2X/week        Progress  Toward Goals  OT Goals(current goals can now be found in the care plan section)  Progress towards OT goals: Progressing toward goals  Acute Rehab OT Goals Patient Stated Goal: go home OT Goal Formulation: With patient  Plan Discharge plan needs to be updated    Co-evaluation                 AM-PAC PT "6 Clicks" Daily Activity     Outcome Measure   Help from another person eating meals?: None Help from another person taking care of personal grooming?: A Little Help from another person toileting, which includes using toliet, bedpan, or urinal?: A Little Help from another person bathing (including washing, rinsing, drying)?: A Lot Help from another person to put on and taking off regular upper body clothing?: A Little Help from another person to put on and taking off regular lower body clothing?: A Lot 6 Click Score: 17    End of Session Equipment Utilized During Treatment: Gait belt;Rolling walker;Other (comment)(3 in 1)  OT Visit Diagnosis: Unsteadiness on feet (R26.81);History of falling (Z91.81);Muscle weakness (generalized) (M62.81);Pain Pain - Right/Left: Right Pain - part of body: Hip;Leg   Activity Tolerance Patient tolerated treatment well   Patient Left with call bell/phone within reach;in bed;with family/visitor present   Nurse Communication      Functional Assessment Tool Used: AM-PAC 6 Clicks Daily Activity   Time: 4235-3614 OT Time Calculation (min): 26 min  Charges: OT G-codes **NOT FOR INPATIENT CLASS** Functional Assessment Tool Used: AM-PAC 6 Clicks Daily Activity OT General Charges $OT Visit: 1 Visit OT Treatments $Self Care/Home Management : 8-22 mins $Therapeutic Activity: 8-22 mins     Galen Manila 08/31/2017, 12:43 PM

## 2017-08-31 NOTE — Plan of Care (Signed)
  Adequate for Discharge Safety: Ability to remain free from injury will improve 08/31/2017 1131 - Adequate for Discharge by Ephraim Hamburger, RN Health Behavior/Discharge Planning: Ability to manage health-related needs will improve 08/31/2017 1131 - Adequate for Discharge by Ephraim Hamburger, RN Pain Managment: General experience of comfort will improve 08/31/2017 1131 - Adequate for Discharge by Ephraim Hamburger, RN Physical Regulation: Ability to maintain clinical measurements within normal limits will improve 08/31/2017 1131 - Adequate for Discharge by Ephraim Hamburger, RN Will remain free from infection 08/31/2017 1131 - Adequate for Discharge by Ephraim Hamburger, RN Skin Integrity: Risk for impaired skin integrity will decrease 08/31/2017 1131 - Adequate for Discharge by Ephraim Hamburger, RN Tissue Perfusion: Risk factors for ineffective tissue perfusion will decrease 08/31/2017 1131 - Adequate for Discharge by Ephraim Hamburger, RN Activity: Risk for activity intolerance will decrease 08/31/2017 1131 - Adequate for Discharge by Ephraim Hamburger, RN Fluid Volume: Ability to maintain a balanced intake and output will improve 08/31/2017 1131 - Adequate for Discharge by Ephraim Hamburger, RN Nutrition: Adequate nutrition will be maintained 08/31/2017 1131 - Adequate for Discharge by Ephraim Hamburger, RN Bowel/Gastric: Will not experience complications related to bowel motility 08/31/2017 1131 - Adequate for Discharge by Ephraim Hamburger, RN Education: Knowledge of the prescribed therapeutic regimen will improve 08/31/2017 1131 - Adequate for Discharge by Ephraim Hamburger, RN Understanding of discharge needs will improve 08/31/2017 1131 - Adequate for Discharge by Ephraim Hamburger, RN Activity: Ability to avoid complications of mobility impairment will improve 08/31/2017 1131 - Adequate for Discharge by Ephraim Hamburger, RN Ability to tolerate increased activity will improve 08/31/2017 1131 - Adequate  for Discharge by Ephraim Hamburger, RN Clinical Measurements: Postoperative complications will be avoided or minimized 08/31/2017 1131 - Adequate for Discharge by Ephraim Hamburger, RN Pain Management: Pain level will decrease with appropriate interventions 08/31/2017 1131 - Adequate for Discharge by Ephraim Hamburger, RN Skin Integrity: Signs of wound healing will improve 08/31/2017 1131 - Adequate for Discharge by Ephraim Hamburger, RN

## 2017-08-31 NOTE — Progress Notes (Signed)
Physical Therapy Treatment Patient Details Name: Allison Beasley MRN: [id number] DOB: [birth year] Today's Date: 08/31/2017    History of Present Illness Jaquilla Woodroof Hartis a 56 y.o.femalewith medical history significant ofdiet controlled diabetes, rheumatoid arthritis, polymyositis, PVD, interstitial lung disease, anemia, GERD, depression, who presents with pain in right shoulder, right hip and right knee after fall.    PT Comments    Patient is making progress toward mobility goals. Pt does continue to require assistance for all mobility. Pt tolerated gait and stair training this session with daughter present. If pt is to d/c home she will need 24 hour assistance due to increased risk of falls. Continue to progress as tolerated.    Follow Up Recommendations  Supervision/Assistance - 24 hour;SNF     Equipment Recommendations  None recommended by PT(well equipped )    Recommendations for Other Services       Precautions / Restrictions Precautions Precautions: Other (comment) Precaution Comments: R hip fx, no precuations. R UE sling for comfort (WBAT) Required Braces or Orthoses: (use sling only as needed) Restrictions Weight Bearing Restrictions: No RUE Weight Bearing: Weight bearing as tolerated RLE Weight Bearing: Weight bearing as tolerated Other Position/Activity Restrictions: WB as tolerated    Mobility  Bed Mobility Overal bed mobility: Needs Assistance Bed Mobility: Rolling;Sidelying to Sit;Sit to Sidelying Rolling: Min guard Sidelying to sit: Min assist Supine to sit: Mod assist Sit to supine: Mod assist Sit to sidelying: Min assist General bed mobility comments: assist to mobilize R LE in/out of bed; cues for sequencing and technique  Transfers Overall transfer level: Needs assistance Equipment used: Rolling walker (2 wheeled) Transfers: Sit to/from Stand Sit to Stand: Min guard         General transfer comment: cues for safe hand  placement  Ambulation/Gait Ambulation/Gait assistance: Min assist Ambulation Distance (Feet): 60 Feet Assistive device: Rolling walker (2 wheeled) Gait Pattern/deviations: Trunk flexed;Step-through pattern;Decreased step length - right;Decreased stride length;Decreased dorsiflexion - right   Gait velocity interpretation: Below normal speed for age/gender General Gait Details: cues for safe use of AD, sequencing, posture, weight shifting toward L side during swing phase of R step, and increased R knee flexion to clear floor   Stairs Stairs: Yes   Stair Management: Two rails;Step to pattern;Forwards;One rail Right;Sideways Number of Stairs: (2 steps X2) General stair comments: cues for sequencing and technique; assist to steady   Wheelchair Mobility    Modified Rankin (Stroke Patients Only)       Balance Overall balance assessment: Needs assistance Sitting-balance support: No upper extremity supported Sitting balance-Leahy Scale: Fair     Standing balance support: During functional activity;Bilateral upper extremity supported Standing balance-Leahy Scale: Poor                              Cognition Arousal/Alertness: Awake/alert Behavior During Therapy: WFL for tasks assessed/performed Overall Cognitive Status: Within Functional Limits for tasks assessed                                        Exercises      General Comments General comments (skin integrity, edema, etc.): daughter present trhoughout session      Pertinent Vitals/Pain Pain Assessment: 0-10 Pain Score: 6  Pain Location: R hip and R shoulder Pain Descriptors / Indicators: Grimacing;Sore;Guarding Pain Intervention(s): Limited activity within patient's tolerance;Monitored during session;Repositioned;Premedicated  before session    Home Living                      Prior Function            PT Goals (current goals can now be found in the care plan section)  Acute Rehab PT Goals Patient Stated Goal: go home PT Goal Formulation: With patient Progress towards PT goals: Progressing toward goals    Frequency    Min 3X/week      PT Plan Current plan remains appropriate    Co-evaluation              AM-PAC PT "6 Clicks" Daily Activity  Outcome Measure  Difficulty turning over in bed (including adjusting bedclothes, sheets and blankets)?: A Lot Difficulty moving from lying on back to sitting on the side of the bed? : Unable Difficulty sitting down on and standing up from a chair with arms (e.g., wheelchair, bedside commode, etc,.)?: Unable Help needed moving to and from a bed to chair (including a wheelchair)?: A Little Help needed walking in hospital room?: A Little Help needed climbing 3-5 steps with a railing? : A Little 6 Click Score: 13    End of Session Equipment Utilized During Treatment: Gait belt Activity Tolerance: Patient tolerated treatment well;Patient limited by pain Patient left: with call bell/phone within reach;in bed;with family/visitor present;Other (comment)(sitting EOB) Nurse Communication: Mobility status PT Visit Diagnosis: Muscle weakness (generalized) (M62.81);Pain;History of falling (Z91.81);Other abnormalities of gait and mobility (R26.89) Pain - Right/Left: Right Pain - part of body: Shoulder;Hip     Time: 0350-0938 PT Time Calculation (min) (ACUTE ONLY): 27 min  Charges:  $Gait Training: 8-22 mins $Therapeutic Activity: 8-22 mins                    G Codes:       Erline Levine, PTA Pager: [phone]     Carolynne Edouard 08/31/2017, 3:42 PM

## 2017-08-31 NOTE — Discharge Summary (Signed)
Physician Discharge Summary  JONAY HITCHCOCK ZOX:[id number] DOB: Jan 05, 1961 DOA: 08/26/2017  PCP: Alain Honey, MD  Admit date: 08/26/2017 Discharge date: 08/31/2017  Admitted From: Home Disposition:  Home  Recommendations for Outpatient Follow-up:  1. Follow up with PCP in 1- weeks 2. Aspirin 81 mg bid for DVT prophylaxis per Orthopedic surgery   Home Health: Yes  Equipment/Devices: Rolling walker   Discharge Condition: Stable CODE STATUS: full  Diet recommendation: Heart healthy  Brief/Interim Summary: 56 year old female who presented to with pain in right shoulder, right hip and right knee after sustaining a mechanical fall. Patient is known to have type 2 diabetes mellitus, rheumatoid arthritis, polymyositis, peripheral vascular disease, history of lung disease, anemia, depression and GERD. Patient had a mechanical fall while going down steps, landed on her right side, she developed severe pain at the right lower extremity to the point where she was not able to ambulate. On initial physical examination blood pressure 147/98, heart rate 101, respiratory rate 13, temperature 99.1. Dry mucous membranes, lungs were clear to auscultation bilaterally, heart S1-S2 present rhythmic, her abdomen was soft nontender, lower extremities with no edema, tenderness of the right shoulder and right hip. Sodium 131, potassium 3.8, chloride 101, bicarbonate 20, glucose 108, bun 14, creatinine 0.85, white count 10.5, hemoglobin 11.4, hematocrit 35.3, platelets 68 Further imaging showed acute distal clavicle fracture on the right. Acute right femoral neck fracture.  CT of the head and neck no acute changes. EKG normal sinus rhythm, normal intervals, normal axis.  Patient was admitted to hospital working diagnosis acute right femoral neck fracture, acute right distal clavicle fracture.   1. Right hip fracture, right distal clavicle fracture. Patient was admitted to the medical ward, she received IV analgesics,  DVT prophylaxis, IV antiemetics and she was seen by orthopedic surgery. Patient underwent a total hip arthroplasty on the right, with no major complications. Patient was seen by physical therapy with recommendations of further rehabilitation at skilled nursing facility or home. Patient has decided to go home with home health services. She will continue aspirin 81 mg twice daily for DVT prophylaxis. Continue pain control with oxycodone/acetaminophen.  For her right shoulder AC separation and minimal distal clavicle fracture, it was recommended conservative management, non-op activities as tolerated.   2. Postoperative anemia. Patient's hemoglobin dropped to 7.4, received 1 unit of packed red blood cell transfusion with improvement of 8.8. No further signs of bleeding. Recommend to follow-up as an outpatient.   3. Hypertension. Patient was continued on amlodipine and metoprolol with good toleration.  4. Rheumatoid arthritis and polymyositis. Stable with no signs of exacerbation, she told resume prednisone, apparently tacrolimus has been discontinued as an outpatient.  5. Left upper extremity wound. Infection seems to be resolved, patient completed therapy with linezolid.  6. Interstitial lung disease. Stable no signs of exacerbation.  7. Calorie protein malnutrition. Continue nutritional supplements.  8. Depression. Continue risperidone and duloxetine  9. Thrombocytopenia. Presumed to be reactive, discharge platelet count 83.     Discharge Diagnoses:  Principal Problem:   Closed right hip fracture (HCC) Active Problems:   Polymyositis (HCC)   GERD (gastroesophageal reflux disease)   Interstitial lung disease (HCC)   RA (rheumatoid arthritis) (HCC)   Depression   Closed nondisplaced fracture of lateral end of right clavicle    Discharge Instructions   Allergies as of 08/31/2017      Reactions   Clindamycin/lincomycin Itching, Rash   Possible reaction (took clindamycin and cipro  at the same time)  Doxycycline Nausea Only   Percocet [oxycodone-acetaminophen] Itching   Ciprofloxacin Itching, Rash   Possible reaction (took clindamycin and cipro at the same time)   Mycophenolate Diarrhea      Medication List    STOP taking these medications   collagenase ointment Commonly known as:  SANTYL   linezolid 600 MG tablet Commonly known as:  ZYVOX   oxycodone 5 MG capsule Commonly known as:  OXY-IR   oxyCODONE 5 MG immediate release tablet Commonly known as:  Oxy IR/ROXICODONE   tacrolimus 1 MG capsule Commonly known as:  PROGRAF     TAKE these medications   amLODipine 5 MG tablet Commonly known as:  NORVASC Take 5 mg by mouth daily. (0900)   aspirin EC 81 MG tablet Take 1 tablet (81 mg total) 2 (two) times daily after a meal by mouth. (0900) What changed:  when to take this   Cholecalciferol 1000 units tablet Take 1,000 Units by mouth daily. (0900)   DULoxetine 30 MG capsule Commonly known as:  CYMBALTA Take 30 mg by mouth daily.   eucerin cream Apply 1 application topically daily. Apply to whole body   famotidine 20 MG tablet Commonly known as:  PEPCID Take 20 mg by mouth 2 (two) times daily.   feeding supplement (ENSURE ENLIVE) Liqd Take 237 mLs 2 (two) times daily between meals by mouth.   Fish Oil 1000 MG Caps Take 1,000 mg by mouth daily. (0900)   Melatonin 3 MG Tabs Take 3 mg by mouth at bedtime.   metoprolol tartrate 25 MG tablet Commonly known as:  LOPRESSOR Take 25 mg by mouth 2 (two) times daily.   multivitamin tablet Take 1 tablet by mouth daily. (0900)   oxyCODONE-acetaminophen 5-325 MG tablet Commonly known as:  ROXICET Take 1-2 tablets every 4 (four) hours as needed by mouth.   polyethylene glycol packet Commonly known as:  MIRALAX / GLYCOLAX Take 17 g 2 (two) times daily by mouth.   predniSONE 5 MG tablet Commonly known as:  DELTASONE Take 5 mg by mouth daily with breakfast.   risperiDONE 0.5 MG  tablet Commonly known as:  RISPERDAL Take 0.5 mg by mouth at bedtime. (2100)   sennosides-docusate sodium 8.6-50 MG tablet Commonly known as:  SENOKOT-S Take 1 tablet by mouth at bedtime.            Durable Medical Equipment  (From admission, onward)        Start     Ordered   08/27/17 1808  DME 3 n 1  Once     08/27/17 1808   08/27/17 1808  DME Walker rolling  Once    Question:  Patient needs a walker to treat with the following condition  Answer:  Status post total replacement of right hip   08/27/17 1808     Follow-up Information    Kathryne HitchBlackman, Christopher Y, MD. Schedule an appointment as soon as possible for a visit in 2 week(s).   Specialty:  Orthopedic Surgery Contact information: [address] [place] [address] [phone]        [facility] Follow up.   Specialty:  Home Health Services Contact information: [address] [place] [address] [phone]        [facility] - Dme Follow up.   Why:  3/1 to be delivered to room prior to DC Contact information: [address] [place] [address] [phone]  Allergies  Allergen Reactions  . Clindamycin/Lincomycin Itching and Rash    Possible reaction (took clindamycin and cipro at the same time)  . Doxycycline Nausea Only  . Percocet [Oxycodone-Acetaminophen] Itching  . Ciprofloxacin Itching and Rash    Possible reaction (took clindamycin and cipro at the same time)   . Mycophenolate Diarrhea    Consultations:  Orthopedics   Procedures/Studies: Dg Chest 1 View  Result Date: 08/27/2017 CLINICAL DATA:  Larey Seat downstairs tonight. EXAM: CHEST 1 VIEW COMPARISON:  Chest radiograph February 01, 2017 FINDINGS: Cardiac silhouette is upper limits of normal in size. Calcified aortic knob. Mild chronic interstitial changes without pleural effusion or focal consolidation. LEFT ear and a RIGHT bibasilar suspected fibrosis. Mildly elevated  RIGHT hemidiaphragm. No pneumothorax. Osteopenia. Acute RIGHT distal clavicle fracture. Extensive soft tissue calcifications. IMPRESSION: Borderline cardiomegaly. Mild chronic interstitial changes and bibasilar suspected fibrosis. Acute RIGHT distal clavicle fracture. 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 Electronically Signed   By: Awilda Metro M.D.   On: 08/27/2017 00:19   Dg Lumbar Spine Complete  Result Date: 08/27/2017 CLINICAL DATA:  Larey Seat downstairs tonight. EXAM: LUMBAR SPINE - COMPLETE 4+ VIEW COMPARISON:  Abdominal radiograph January 22, 2017 FINDINGS: There is no evidence of lumbar spine fracture. Alignment is normal. Osteopenia. Intervertebral disc spaces are maintained. Extensive soft tissue calcifications, limiting assessment of the lumbosacral junction on frontal radiograph. IMPRESSION: No acute fracture deformity or malalignment. 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 Electronically Signed   By: Awilda Metro M.D.   On: 08/27/2017 00:17   Dg Shoulder Right  Result Date: 08/27/2017 CLINICAL DATA:  Larey Seat downstairs tonight. EXAM: RIGHT SHOULDER - 2+ VIEW COMPARISON:  Chest radiograph February 01, 2017 FINDINGS: Distal clavicle now projects above the acromion, with acute suspected distal clavicle fracture. Osteopenia without destructive bony lesions. Humeral head is located. Extensive soft tissue calcifications. IMPRESSION: Probable acute distal clavicle displaced fracture. 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 Electronically Signed   By: Awilda Metro M.D.   On: 08/27/2017 00:16   Ct Head Wo Contrast  Result Date: 08/27/2017 CLINICAL DATA:  Fall going up brick steps. EXAM: CT HEAD WITHOUT CONTRAST CT CERVICAL SPINE WITHOUT CONTRAST TECHNIQUE: Multidetector CT imaging of the head and cervical spine was performed following the standard protocol without intravenous contrast.  Multiplanar CT image reconstructions of the cervical spine were also generated. COMPARISON:  None. FINDINGS: CT HEAD FINDINGS Brain: Mild generalized atrophy, moderate chronic small vessel ischemia. No intracranial hemorrhage, mass effect, or midline shift. Basal gangliar calcifications, typically incidental. No hydrocephalus. The basilar cisterns are patent. No evidence of territorial infarct or acute ischemia. No extra-axial or intracranial fluid collection. Vascular: Atherosclerosis of skullbase vasculature without hyperdense vessel or abnormal calcification. Skull: No fracture or focal lesion. Sinuses/Orbits: Paranasal sinuses and mastoid air cells are clear. The visualized orbits are unremarkable. Other: None. CT CERVICAL SPINE FINDINGS Alignment: Normal. Skull base and vertebrae: No acute fracture. Vertebral body heights are maintained. The dens and skull base are intact. Soft tissues and spinal canal: No prevertebral fluid or swelling. No visible canal hematoma. Disc levels:  Minimal C4-C5 disc space narrowing. Upper chest: Right distal clavicle fracture with associated soft tissue edema, partially included. Breathing motion artifact, probable emphysema. Other: Carotid calcifications. IMPRESSION: 1.  No acute intracranial abnormality.  No skull fracture. 2. No fracture or subluxation of the cervical spine. 3. Distal right clavicle fracture, partially included, previously characterized on shoulder radiograph. Electronically Signed   By: Lujean Rave.D.  On: 08/27/2017 01:36   Ct Cervical Spine Wo Contrast  Result Date: 08/27/2017 CLINICAL DATA:  Fall going up brick steps. EXAM: CT HEAD WITHOUT CONTRAST CT CERVICAL SPINE WITHOUT CONTRAST TECHNIQUE: Multidetector CT imaging of the head and cervical spine was performed following the standard protocol without intravenous contrast. Multiplanar CT image reconstructions of the cervical spine were also generated. COMPARISON:  None. FINDINGS: CT HEAD  FINDINGS Brain: Mild generalized atrophy, moderate chronic small vessel ischemia. No intracranial hemorrhage, mass effect, or midline shift. Basal gangliar calcifications, typically incidental. No hydrocephalus. The basilar cisterns are patent. No evidence of territorial infarct or acute ischemia. No extra-axial or intracranial fluid collection. Vascular: Atherosclerosis of skullbase vasculature without hyperdense vessel or abnormal calcification. Skull: No fracture or focal lesion. Sinuses/Orbits: Paranasal sinuses and mastoid air cells are clear. The visualized orbits are unremarkable. Other: None. CT CERVICAL SPINE FINDINGS Alignment: Normal. Skull base and vertebrae: No acute fracture. Vertebral body heights are maintained. The dens and skull base are intact. Soft tissues and spinal canal: No prevertebral fluid or swelling. No visible canal hematoma. Disc levels:  Minimal C4-C5 disc space narrowing. Upper chest: Right distal clavicle fracture with associated soft tissue edema, partially included. Breathing motion artifact, probable emphysema. Other: Carotid calcifications. IMPRESSION: 1.  No acute intracranial abnormality.  No skull fracture. 2. No fracture or subluxation of the cervical spine. 3. Distal right clavicle fracture, partially included, previously characterized on shoulder radiograph. Electronically Signed   By: Rubye Oaks M.D.   On: 08/27/2017 01:36   Dg C-arm 1-60 Min  Result Date: 08/27/2017 CLINICAL DATA:  Operative imaging for right hip arthroplasty. EXAM: OPERATIVE RIGHT HIP (WITH PELVIS IF PERFORMED) 3 VIEWS TECHNIQUE: Fluoroscopic spot image(s) were submitted for interpretation post-operatively. COMPARISON:  None. FINDINGS: New total right hip arthroplasty appears well seated and aligned on the provided views. No evidence of acute fracture or operative complication. IMPRESSION: Well-positioned right hip total arthroplasty. Electronically Signed   By: Amie Portland M.D.   On:  08/27/2017 14:14   Dg Hip Operative Unilat W Or W/o Pelvis Right  Result Date: 08/27/2017 CLINICAL DATA:  Operative imaging for right hip arthroplasty. EXAM: OPERATIVE RIGHT HIP (WITH PELVIS IF PERFORMED) 3 VIEWS TECHNIQUE: Fluoroscopic spot image(s) were submitted for interpretation post-operatively. COMPARISON:  None. FINDINGS: New total right hip arthroplasty appears well seated and aligned on the provided views. No evidence of acute fracture or operative complication. IMPRESSION: Well-positioned right hip total arthroplasty. Electronically Signed   By: Amie Portland M.D.   On: 08/27/2017 14:14   Dg Hip Unilat With Pelvis 2-3 Views Right  Result Date: 08/27/2017 CLINICAL DATA:  Larey Seat downstairs tonight. EXAM: DG HIP (WITH OR WITHOUT PELVIS) 2-3V RIGHT COMPARISON:  Abdominal radiograph January 18, 2017 FINDINGS: Acute RIGHT femoral neck fracture with impaction, slight internal rotation of the distal bony fragments. No dislocation. Osteopenia. Limited assessment due to extensive soft tissue calcifications. Severe vascular calcifications. IMPRESSION: Acute RIGHT femoral neck fracture.  No dislocation. 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   On: 08/27/2017 00:14   Dg Knee 3 View Right  Result Date: 08/27/2017 CLINICAL DATA:  Fall down stairs tonight.  Right hip fracture. EXAM: RIGHT KNEE - 3 VIEW COMPARISON:  None. FINDINGS: No acute fracture or subluxation. The alignment and joint spaces are maintained. Trace peripheral spurring. No joint effusion. The bones are under mineralized. Advanced vascular calcifications. Scattered soft tissue calcifications. IMPRESSION: No acute fracture or subluxation of the  right knee. Electronically Signed   By: Rubye Oaks M.D.   On: 08/27/2017 01:07       Subjective: Patient feeling better, pain is controlled with analgesics, no chest pain, no dyspnea, no nausea or vomiting.    Discharge Exam: Vitals:   08/30/17 2012 08/31/17 0542  BP: 109/67 113/71  Pulse: (!) 120 (!) 107  Resp: 16 17  Temp: 99.1 F (37.3 C) 99.5 F (37.5 C)  SpO2: 94% 94%   Vitals:   08/30/17 0500 08/30/17 1500 08/30/17 2012 08/31/17 0542  BP: 127/79 109/79 109/67 113/71  Pulse: 96 (!) 57 (!) 120 (!) 107  Resp:  16 16 17   Temp: 98.4 F (36.9 C) 99.3 F (37.4 C) 99.1 F (37.3 C) 99.5 F (37.5 C)  TempSrc: Oral Oral Oral Oral  SpO2:  92% 94% 94%  Weight:      Height:        General: Pt is alert, awake, not in acute distress E ENT: no pallor or icterus/  Cardiovascular: RRR, S1/S2 +, no rubs, no gallops Respiratory: CTA bilaterally, no wheezing, no rhonchi Abdominal: Soft, NT, ND, bowel sounds + Extremities: no edema, no cyanosis. No deformities.     The results of significant diagnostics from this hospitalization (including imaging, microbiology, ancillary and laboratory) are listed below for reference.     Microbiology: Recent Results (from the past 240 hour(s))  Surgical pcr screen     Status: None   Collection Time: 08/27/17  3:36 AM  Result Value Ref Range Status   MRSA, PCR NEGATIVE NEGATIVE Final   Staphylococcus aureus NEGATIVE NEGATIVE Final    Comment: (NOTE) The Xpert SA Assay (FDA approved for NASAL specimens in patients 52 years of age and older), is one component of a comprehensive surveillance program. It is not intended to diagnose infection nor to guide or monitor treatment.      Labs: BNP (last 3 results) No results for input(s): BNP in the last 8760 hours. Basic Metabolic Panel: Recent Labs  Lab 08/26/17 2300 08/28/17 0520 08/29/17 0628  NA 131* 140 139  K 3.8 4.5 4.3  CL 101 108 104  CO2 20* 24 28  GLUCOSE 108* 128* 115*  BUN 14 21* 10  CREATININE 0.85 0.83 0.67  CALCIUM 8.2* 7.9* 8.2*   Liver Function Tests: No results for input(s): AST, ALT, ALKPHOS, BILITOT, PROT, ALBUMIN in the last 168 hours. No results for input(s):  LIPASE, AMYLASE in the last 168 hours. No results for input(s): AMMONIA in the last 168 hours. CBC: Recent Labs  Lab 08/26/17 2300 08/28/17 0520 08/29/17 0628 08/30/17 0430  WBC 10.5 9.6 15.8* 16.5*  NEUTROABS 4.9 5.8  --   --   HGB 11.4* 7.4* 8.6* 8.8*  HCT 35.3* 23.2* 27.0* 27.2*  MCV 85.7 86.6 87.4 86.1  PLT 68* 98* 74* 83*   Cardiac Enzymes: No results for input(s): CKTOTAL, CKMB, CKMBINDEX, TROPONINI in the last 168 hours. BNP: Invalid input(s): POCBNP CBG: Recent Labs  Lab 08/27/17 0823 08/27/17 1151 08/27/17 1339  GLUCAP 129* 135* 129*   D-Dimer No results for input(s): DDIMER in the last 72 hours. Hgb A1c No results for input(s): HGBA1C in the last 72 hours. Lipid Profile No results for input(s): CHOL, HDL, LDLCALC, TRIG, CHOLHDL, LDLDIRECT in the last 72 hours. Thyroid function studies No results for input(s): TSH, T4TOTAL, T3FREE, THYROIDAB in the last 72 hours.  Invalid input(s): FREET3 Anemia work up No results for input(s): VITAMINB12, FOLATE, FERRITIN, TIBC, IRON, RETICCTPCT  in the last 72 hours. Urinalysis    Component Value Date/Time   COLORURINE YELLOW 01/31/2017 1035   APPEARANCEUR CLOUDY (A) 01/31/2017 1035   LABSPEC 1.020 01/31/2017 1035   PHURINE 5.5 01/31/2017 1035   GLUCOSEU NEGATIVE 01/31/2017 1035   HGBUR LARGE (A) 01/31/2017 1035   BILIRUBINUR NEGATIVE 01/31/2017 1035   KETONESUR NEGATIVE 01/31/2017 1035   PROTEINUR 30 (A) 01/31/2017 1035   UROBILINOGEN 0.2 12/24/2012 0705   NITRITE POSITIVE (A) 01/31/2017 1035   LEUKOCYTESUR LARGE (A) 01/31/2017 1035   Sepsis Labs Invalid input(s): PROCALCITONIN,  WBC,  LACTICIDVEN Microbiology Recent Results (from the past 240 hour(s))  Surgical pcr screen     Status: None   Collection Time: 08/27/17  3:36 AM  Result Value Ref Range Status   MRSA, PCR NEGATIVE NEGATIVE Final   Staphylococcus aureus NEGATIVE NEGATIVE Final    Comment: (NOTE) The Xpert SA Assay (FDA approved for NASAL  specimens in patients 80 years of age and older), is one component of a comprehensive surveillance program. It is not intended to diagnose infection nor to guide or monitor treatment.      Time coordinating discharge: 45 minutes  SIGNED:   Coralie Keens, MD  [facility] 08/31/2017, 10:49 AM Pager [phone]  If 7PM-7AM, please contact night-coverage [email or web address] Password [email or web address]

## 2017-09-06 ENCOUNTER — Telehealth (INDEPENDENT_AMBULATORY_CARE_PROVIDER_SITE_OTHER): Payer: Self-pay | Admitting: Orthopaedic Surgery

## 2017-09-06 NOTE — Telephone Encounter (Signed)
Verbal PT orders needed :   2 x for 1 week 3 x for 1 week 2 x for 3 weeks  Also nurse evaluation needed for dressing changes on patients hip.  Merton Border, PT # [phone]

## 2017-09-07 NOTE — Telephone Encounter (Signed)
Verbal order given  

## 2017-09-11 ENCOUNTER — Inpatient Hospital Stay (INDEPENDENT_AMBULATORY_CARE_PROVIDER_SITE_OTHER): Payer: BLUE CROSS/BLUE SHIELD | Admitting: Orthopaedic Surgery

## 2017-09-13 ENCOUNTER — Ambulatory Visit (INDEPENDENT_AMBULATORY_CARE_PROVIDER_SITE_OTHER): Payer: BLUE CROSS/BLUE SHIELD | Admitting: Physician Assistant

## 2017-09-13 ENCOUNTER — Encounter (INDEPENDENT_AMBULATORY_CARE_PROVIDER_SITE_OTHER): Payer: Self-pay | Admitting: Physician Assistant

## 2017-09-13 DIAGNOSIS — Z96641 Presence of right artificial hip joint: Secondary | ICD-10-CM

## 2017-09-13 NOTE — Progress Notes (Signed)
Mrs. Knippenberg returns today for follow-up status post right total hip arthroplasty 08/27/2017.  States overall she is doing well.  She states she walks sometimes and forgets to take her walker weather.  Said no chest pain no Pain no fevers or chills.  She does have some shortness of breath which is acute on chronic.  This only occurs with long distance walking.  She denies any orthopnea.  Physical exam: Right hip surgical incisions well-healed and approximated with staples.  No signs of infection.  No signs of seroma.  She has excellent range of motion of the hip without pain today.  Calf supple nontender  Impression: Status post right total hip arthroplasty  Plan: Staples removed Steri-Strips applied.  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  We did call in a muscle relaxer for today.  We will see her back if 6 weeks postop.  Sooner if there is any questions or concerns.

## 2017-09-23 ENCOUNTER — Emergency Department (HOSPITAL_COMMUNITY)
Admission: EM | Admit: 2017-09-23 | Discharge: 2017-09-23 | Disposition: A | Discharge status: Home / Self Care | Payer: BLUE CROSS/BLUE SHIELD | Attending: Emergency Medicine | Admitting: Emergency Medicine

## 2017-09-23 ENCOUNTER — Emergency Department (HOSPITAL_COMMUNITY): Payer: BLUE CROSS/BLUE SHIELD

## 2017-09-23 ENCOUNTER — Encounter (HOSPITAL_COMMUNITY): Payer: Self-pay

## 2017-09-23 ENCOUNTER — Other Ambulatory Visit: Payer: Self-pay

## 2017-09-23 DIAGNOSIS — W010XXA Fall on same level from slipping, tripping and stumbling without subsequent striking against object, initial encounter: Secondary | ICD-10-CM | POA: Insufficient documentation

## 2017-09-23 DIAGNOSIS — S52615A Nondisplaced fracture of left ulna styloid process, initial encounter for closed fracture: Secondary | ICD-10-CM | POA: Insufficient documentation

## 2017-09-23 DIAGNOSIS — Y939 Activity, unspecified: Secondary | ICD-10-CM | POA: Diagnosis not present

## 2017-09-23 DIAGNOSIS — Y929 Unspecified place or not applicable: Secondary | ICD-10-CM | POA: Insufficient documentation

## 2017-09-23 DIAGNOSIS — S32311A Displaced avulsion fracture of right ilium, initial encounter for closed fracture: Secondary | ICD-10-CM | POA: Insufficient documentation

## 2017-09-23 DIAGNOSIS — S0990XA Unspecified injury of head, initial encounter: Secondary | ICD-10-CM | POA: Diagnosis not present

## 2017-09-23 DIAGNOSIS — S32313A Displaced avulsion fracture of unspecified ilium, initial encounter for closed fracture: Secondary | ICD-10-CM

## 2017-09-23 DIAGNOSIS — S52502A Unspecified fracture of the lower end of left radius, initial encounter for closed fracture: Secondary | ICD-10-CM | POA: Insufficient documentation

## 2017-09-23 DIAGNOSIS — Z79899 Other long term (current) drug therapy: Secondary | ICD-10-CM | POA: Diagnosis not present

## 2017-09-23 DIAGNOSIS — Y998 Other external cause status: Secondary | ICD-10-CM | POA: Diagnosis not present

## 2017-09-23 DIAGNOSIS — S32501A Unspecified fracture of right pubis, initial encounter for closed fracture: Secondary | ICD-10-CM | POA: Diagnosis not present

## 2017-09-23 DIAGNOSIS — Z7982 Long term (current) use of aspirin: Secondary | ICD-10-CM | POA: Diagnosis not present

## 2017-09-23 DIAGNOSIS — I10 Essential (primary) hypertension: Secondary | ICD-10-CM | POA: Diagnosis not present

## 2017-09-23 DIAGNOSIS — S32591A Other specified fracture of right pubis, initial encounter for closed fracture: Secondary | ICD-10-CM

## 2017-09-23 DIAGNOSIS — E119 Type 2 diabetes mellitus without complications: Secondary | ICD-10-CM | POA: Diagnosis not present

## 2017-09-23 DIAGNOSIS — S79911A Unspecified injury of right hip, initial encounter: Secondary | ICD-10-CM | POA: Diagnosis present

## 2017-09-23 MED ORDER — OXYCODONE-ACETAMINOPHEN 5-325 MG PO TABS
2.0000 | ORAL_TABLET | Freq: Once | ORAL | Status: AC
  Administered 2017-09-23: 2 via ORAL
  Filled 2017-09-23: qty 2

## 2017-09-23 NOTE — Discharge Instructions (Signed)
USE WHEELCHAIR AND DO NOT BEAR WEIGHT ON YOUR RIGHT LEG UNTIL YOU SEE DR. BLACKMAN. KEEP ARM ELEVATED AS OFTEN AS POSSIBLE.

## 2017-09-23 NOTE — ED Notes (Signed)
Pt stable, VS within normal limits, and verbalizes understanding of d/c instructions.

## 2017-09-23 NOTE — ED Notes (Signed)
Ortho tech notified of need for sugar tong short arm splint.

## 2017-09-23 NOTE — ED Provider Notes (Signed)
[facility] EMERGENCY DEPARTMENT Provider Note   CSN: [id number] Arrival date & time: 09/23/17  7824     History   Chief Complaint Chief Complaint  Patient presents with  . Fall    HPI PAETON LATOUCHE is a 56 y.o. female.  56yo F w/ PMH including polymyositis, rheumatoid arthritis, type 2 diabetes mellitus, peripheral vascular disease who presents with right hip and left wrist pain.  Yesterday evening the patient tried to step over a brick and tripped, falling forward onto her outstretched hands.  She hit her head" saw stars" but did not lose consciousness.  No vomiting or vision changes since the fall.  She reports mild to moderate left wrist pain and moderate to severe pain in her right hip.  2 weeks ago she had a right hip replacement by Dr. Magnus Ivan and has been recovering well at home.  She states her pain has been steadily improving until after the fall last night, the pain is much more severe now.  She has taken all of her medications including OxyContin this morning.  She has not been able to ambulate since the fall.  No anticoagulant use.   The history is provided by the patient.  Fall     Past Medical History:  Diagnosis Date  . Anemia   . Anxiety   . Constipation   . Depression   . Fibromyalgia   . GERD (gastroesophageal reflux disease)   . H. influenzae infection 2018  . Headache(784.0)   . Hypertension   . Interstitial pulmonary disease, unspecified (HCC) 2018  . Open wound of left buttock 56/2018  . Osteoarthritis   . Osteopenia   . PAD (peripheral artery disease) (HCC)   . Peripheral vascular disease (HCC)   . Pneumonia 03/2012  . Polymyositis (HCC)   . Pressure ulcer of buttock 02/2017   right Buttock-   . Pressure ulcer- Left Heel 2018   unstageable   . Raynaud phenomenon   . Rheumatoid arthritis (HCC)   . Seizures (HCC)    with chid birth 68f 2 childre  . Sepsis (HCC) 2018  . Shortness of breath    with exertion  . Type II  diabetes mellitus (HCC)    on no medications    Patient Active Problem List   Diagnosis Date Noted  . Closed right hip fracture (HCC) 08/27/2017  . RA (rheumatoid arthritis) (HCC) 08/27/2017  . Depression 08/27/2017  . Closed fracture of neck of right femur (HCC)   . Closed nondisplaced fracture of lateral end of right clavicle   . Fall   . Knee pain, right   . Pressure injury of skin 05/19/2017  . PAD (peripheral artery disease) (HCC) 05/17/2017  . Abscess of right foot 10/27/2015  . Ulcer of foot (HCC) 08/19/2015  . Infection 07/21/2015  . Toe infection 05/19/2015  . Pain of great toe 05/07/2015  . Peripheral vascular disease, unspecified (HCC) 04/24/2014  . Pain of right lower extremity 04/10/2014  . Sensation of cold in leg- Right Leg and foot 04/10/2014  . Amputated great toe (HCC) 04/24/2013  . Open wound of great toe 03/21/2013  . Aftercare following surgery of the circulatory system, NEC 02/14/2013  . 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 Atherosclerosis of native arteries of the extremities with gangrene (HCC) 01/24/2013  . Dyspnea 01/13/2013  . Elevated troponin borderline -- no true elevation, resolved. 01/13/2013  . Raynaud's disease 01/13/2013  . Polymyositis (HCC) 01/13/2013  .  Fibromyalgia 01/13/2013  . Anemia, iron deficiency 10/25/2012  . GERD (gastroesophageal reflux disease) 07/30/2012  . Interstitial lung disease (HCC) 07/30/2012  . PNA (pneumonia) 04/09/2012  . 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 ABDOMINAL AORTAGRAM N/A 07/25/2014   Procedure: ABDOMINAL Ronny Flurry;  Surgeon: Sherren Kerns, MD;  Location: [facility] CATH LAB;  Service: Cardiovascular;  Laterality: N/A;  . AMPUTATION Left 12/24/2012   Procedure: AMPUTATION DIGIT left great toe;  Surgeon: Sherren Kerns, MD;  Location: [facility] OR;  Service: Vascular;  Laterality:  Left;  GREAT  . AMPUTATION Right 05/19/2015   Procedure: AMPUTATION DIGIT;  Surgeon: Sherren Kerns, MD;  Location: [facility] OR;  Service: Vascular;  Laterality: Right;  . AMPUTATION Right 07/22/2015   Procedure: RAY AMPUTATION OF RIGHT GREAT TOE AMPUTATION SITE;  Surgeon: Sherren Kerns, MD;  Location: [facility] OR;  Service: Vascular;  Laterality: Right;  . AMPUTATION Left 05/17/2017   Procedure: AMPUTATION LEFT FIFTH TOE;  Surgeon: Sherren Kerns, MD;  Location: [facility] OR;  Service: Vascular;  Laterality: Left;  . APPLICATION OF A-CELL OF EXTREMITY Right 11/12/2015   Procedure: INTEGRA;  Surgeon: Alena Bills Dillingham, DO;  Location: [facility] OR;  Service: Plastics;  Laterality: Right;  . CESAREAN SECTION     x3  . COLONOSCOPY    . HYSTEROSCOPY     w/ D&C with excision of endometrial polyps  . I&D EXTREMITY Right 10/26/2015   Procedure: IRRIGATION AND DEBRIDEMENT EXTREMITY/RIGHT FOOT;  Surgeon: Fransisco Hertz, MD;  Location: [facility] OR;  Service: Vascular;  Laterality: Right;  . I&D EXTREMITY Right 10/29/2015   Procedure: IRRIGATION AND DEBRIDEMENT RIGHT FOOT WITH ACELL AND VAC PLACEMENT;  Surgeon: Alena Bills Dillingham, DO;  Location: [facility] OR;  Service: Plastics;  Laterality: Right;  . I&D EXTREMITY Right 11/12/2015   Procedure: IRRIGATION AND DEBRIDEMENT RIGHT EXTREMITY;  Surgeon: Alena Bills Dillingham, DO;  Location: [facility] OR;  Service: Plastics;  Laterality: Right;  . IRRIGATION AND DEBRIDEMENT FOOT Right 10/26/2015  . left great toenail removal  Left 2013   done by [place] [facility]  . MINOR APPLICATION OF WOUND VAC Right 11/12/2015   Procedure: MINOR APPLICATION OF WOUND VAC;  Surgeon: Alena Bills Dillingham, DO;  Location: [facility] OR;  Service: Plastics;  Laterality: Right;  . PERIPHERAL VASCULAR CATHETERIZATION N/A 05/15/2015   Procedure: Abdominal Aortogram;  Surgeon: Sherren Kerns, MD;  Location: [facility] INVASIVE CV LAB;  Service: Cardiovascular;  Laterality: N/A;  . SKIN SPLIT GRAFT Right 12/02/2015   Procedure: SKIN GRAFT SPLIT  THICKNESS RIGHT FOOT WITH PLACEMENT VAC DRESSING;  Surgeon: Alena Bills Dillingham, DO;  Location: [facility] OR;  Service: Plastics;  Laterality: Right;  . TOE AMPUTATION Left 05/17/2017   5th toe  . TONSILLECTOMY Bilateral   . TOTAL HIP ARTHROPLASTY Right 08/27/2017  . TOTAL HIP ARTHROPLASTY Right 08/27/2017   Procedure: TOTAL HIP ARTHROPLASTY ANTERIOR APPROACH;  Surgeon: [name], MD;  Location: [facility] OR;  Service: Orthopedics;  Laterality: Right;  . TUBAL LIGATION      OB History    Gravida Para Term Preterm AB Living   4 3     1 3    SAB TAB Ectopic Multiple Live Births                   Home Medications    Prior to Admission medications   Medication Sig Start Date End Date Taking? Authorizing Provider  amLODipine (NORVASC) 5  MG tablet Take 5 mg by mouth daily. (0900)    [provider]  aspirin EC 81 MG tablet Take 1 tablet (81 mg total) 2 (two) times daily after a meal by mouth. (0900) 08/29/17   Kathryne HitchBlackman, Christopher Y, MD  Cholecalciferol 1000 units tablet Take 1,000 Units by mouth daily. (0900)    [provider]  DULoxetine (CYMBALTA) 30 MG capsule Take 30 mg by mouth daily. 08/22/17   [provider]  famotidine (PEPCID) 20 MG tablet Take 20 mg by mouth 2 (two) times daily.    [provider]  feeding supplement, ENSURE ENLIVE, (ENSURE ENLIVE) LIQD Take 237 mLs 2 (two) times daily between meals by mouth. 08/31/17   Arrien, York RamMauricio Daniel, MD  Melatonin 3 MG TABS Take 3 mg by mouth at bedtime.    [provider]  metoprolol tartrate (LOPRESSOR) 25 MG tablet Take 25 mg by mouth 2 (two) times daily.     [provider]  Multiple Vitamin (MULTIVITAMIN) tablet Take 1 tablet by mouth daily. (0900)    [provider]  Omega-3 Fatty Acids (FISH OIL) 1000 MG CAPS Take 1,000 mg by mouth daily. (0900)    [provider]  oxyCODONE-acetaminophen (ROXICET) 5-325 MG tablet Take 1-2 tablets every 4 (four) hours as  needed by mouth. 08/29/17   Kathryne HitchBlackman, Christopher Y, MD  polyethylene glycol [facility](MIRALAX / Ethelene HalGLYCOLAX) packet Take 17 g 2 (two) times daily by mouth. 08/31/17   Arrien, York RamMauricio Daniel, MD  predniSONE (DELTASONE) 5 MG tablet Take 5 mg by mouth daily with breakfast.    [provider]  risperiDONE (RISPERDAL) 0.5 MG tablet Take 0.5 mg by mouth at bedtime. (2100)    [provider]  sennosides-docusate sodium (SENOKOT-S) 8.6-50 MG tablet Take 1 tablet by mouth at bedtime.    [provider]  Skin Protectants, Misc. (EUCERIN) cream Apply 1 application topically daily. Apply to whole body    [provider]    Family History Family History  Problem Relation Age of Onset  . Diabetes Mother   . Hypertension Mother   . Diabetes Father   . Heart disease Father   . Hypertension Sister   . Heart disease Sister     Social History Social History   Tobacco Use  . Smoking status: Never Smoker  . Smokeless tobacco: Never Used  Substance Use Topics  . Alcohol use: No    Alcohol/week: 0.0 oz    Comment: 05/19/2015 "might have a couple drinks/yr"  . Drug use: No     Allergies   Clindamycin/lincomycin; Doxycycline; Percocet [oxycodone-acetaminophen]; Ciprofloxacin; and Mycophenolate   Review of Systems Review of Systems All other systems reviewed and are negative except that which was mentioned in HPI   Physical Exam Updated Vital Signs BP 139/83 (BP Location: Right Arm)   Pulse 80   Temp 98 F (36.7 C) (Oral)   Resp 18   LMP 06/20/2012   SpO2 100%   Physical Exam  Constitutional: She is oriented to person, place, and time. She appears well-developed and well-nourished. No distress.  HENT:  Head: Normocephalic and atraumatic.  Moist mucous membranes  Eyes: Conjunctivae are normal. Pupils are equal, round, and reactive to light.  Neck: Neck supple.  Cardiovascular: Normal rate, regular rhythm, normal heart sounds and intact distal pulses.  No murmur  heard. Pulmonary/Chest: Effort normal and breath sounds normal.  Abdominal: Soft. Bowel sounds are normal. She exhibits no distension. There is no tenderness.  Musculoskeletal: She  exhibits edema, tenderness and deformity.  Swelling, tenderness, and bruising of L distal radius at wrist, normal sensation fingers; normal sensation feet   Neurological: She is alert and oriented to person, place, and time.  Fluent speech  Skin: Skin is warm and dry.  Incision site over R anterior hip clean and healing well, mildly tender over anterior hip  Psychiatric: She has a normal mood and affect. Judgment normal.  Nursing note and vitals reviewed.    ED Treatments / Results  Labs (all labs ordered are listed, but only abnormal results are displayed) Labs Reviewed - No data to display  EKG  EKG Interpretation None       Radiology Dg Wrist Complete Left  Result Date: 09/23/2017 CLINICAL DATA:  Left wrist pain after fall. EXAM: LEFT WRIST - COMPLETE 3+ VIEW COMPARISON:  None. FINDINGS: There is a nondisplaced, mildly impacted fracture of the distal radius metaphysis. No definite intra-articular extension. There is minimal dorsal angulation. Nondisplaced fracture through the ulnar styloid. Diffuse osteopenia. Soft tissue swelling about the wrist. Atherosclerotic vascular calcifications. IMPRESSION: 1. Nondisplaced, mildly impacted fracture of the distal radius metaphysis with minimal dorsal angulation. 2. Nondisplaced fracture of the ulnar styloid. Electronically Signed   By: Obie Dredge M.D.   On: 09/23/2017 10:34   Dg Hip Unilat  With Pelvis 2-3 Views Right  Result Date: 09/23/2017 CLINICAL DATA:  Right hip pain after fall. EXAM: DG HIP (WITH OR WITHOUT PELVIS) 2-3V RIGHT COMPARISON:  Pelvic x-rays dated August 27, 2017. FINDINGS: Postsurgical changes related to prior right total hip arthroplasty. No fracture or dislocation. No hardware failure or loosening. Mild left hip superior joint space  narrowing. The pubic symphysis and sacroiliac joints are intact. Diffuse soft tissue calcifications again noted. Severe atherosclerotic vascular calcifications. IMPRESSION: 1. Prior right total hip arthroplasty without evidence of hardware complication, dislocation, or periprosthetic fracture. Evaluation is somewhat limited due to extensive overlying soft tissue calcifications, consistent with patient's history of dermatomyositis/polymyositis. Electronically Signed   By: Obie Dredge M.D.   On: 09/23/2017 10:31    Procedures .Splint Application Date/Time: 09/23/2017 3:32 PM Performed by: Saul Fordyce Authorized by: Laurence Spates, MD   Consent:    Consent obtained:  Verbal   Consent given by:  Patient   Alternatives discussed:  No treatment Pre-procedure details:    Sensation:  Normal Procedure details:    Laterality:  Left   Location:  Wrist   Wrist:  L wrist   Cast type:  Short arm   Splint type:  Sugar tong   Supplies:  Ortho-Glass Post-procedure details:    Pain:  Improved   Sensation:  Normal   Patient tolerance of procedure:  Tolerated well, no immediate complications   (including critical care time)  Medications Ordered in ED Medications - No data to display   Initial Impression / Assessment and Plan / ED Course  I have reviewed the triage vital signs and the nursing notes.  Pertinent labs & imaging results that were available during my care of the patient were reviewed by me and considered in my medical decision making (see chart for details).    Pt w/ L wrist and R hip pain after falling last night.  Well-appearing and in no acute distress on exam.  She was neurovascularly intact distally.  Surgical site appears to be healing well.  X-rays show nondisplaced impacted fracture of distal radius as well as fracture of ulnar styloid.  For this problem, placed in sugar tong splint  and sling.  Regarding her hip, x-ray did not show any obvious fractures but she  has not been able to ambulate therefore I contacted [place] [facility] and discussed with Dr. Cleophas Dunker.  He stated I could get a CT and the patient can follow-up with Dr. Magnus Ivan.  CT shows nondisplaced avulsion fracture of anterior inferior iliac as well as possible inferior pubic ramus fracture.  She already has pain medications and wheelchair set up at home.  I have instructed her to be strict nonweightbearing until she sees Dr. Magnus Ivan this week.  Dr. Cleophas Dunker states she can also follow-up with the clinic for her arm injury.  Have discussed supportive measures and return precautions and patient discharged in satisfactory condition.  Final Clinical Impressions(s) / ED Diagnoses   Final diagnoses:  Closed fracture of distal end of left radius, unspecified fracture morphology, initial encounter  Closed nondisplaced fracture of styloid process of left ulna, initial encounter  Closed avulsion fracture of anterior inferior iliac spine of pelvis (HCC)  Closed fracture of right inferior pubic ramus, initial encounter [facility])    ED Discharge Orders    None       Meeghan Skipper, Ambrose Finland, MD 09/23/17 [phone]

## 2017-09-23 NOTE — Progress Notes (Signed)
Orthopedic Tech Progress Note Patient Details:  Allison Beasley [birth year] [id number]  Ortho Devices Type of Ortho Device: Ace wrap, Arm sling, Sugartong splint Ortho Device/Splint Interventions: Application   Saul Fordyce 09/23/2017, 1:13 PM

## 2017-09-23 NOTE — ED Triage Notes (Signed)
Patient complains of right hip and left wrist pain after falling last night, patient alert and oriented, NAD. Denies loc

## 2017-10-02 ENCOUNTER — Ambulatory Visit (INDEPENDENT_AMBULATORY_CARE_PROVIDER_SITE_OTHER): Payer: BLUE CROSS/BLUE SHIELD | Admitting: Orthopaedic Surgery

## 2017-10-11 ENCOUNTER — Ambulatory Visit (INDEPENDENT_AMBULATORY_CARE_PROVIDER_SITE_OTHER): Payer: BLUE CROSS/BLUE SHIELD

## 2017-10-11 ENCOUNTER — Ambulatory Visit (INDEPENDENT_AMBULATORY_CARE_PROVIDER_SITE_OTHER): Payer: BLUE CROSS/BLUE SHIELD | Admitting: Orthopaedic Surgery

## 2017-10-11 ENCOUNTER — Telehealth (INDEPENDENT_AMBULATORY_CARE_PROVIDER_SITE_OTHER): Payer: Self-pay

## 2017-10-11 ENCOUNTER — Encounter (INDEPENDENT_AMBULATORY_CARE_PROVIDER_SITE_OTHER): Payer: Self-pay | Admitting: Orthopaedic Surgery

## 2017-10-11 DIAGNOSIS — S6992XA Unspecified injury of left wrist, hand and finger(s), initial encounter: Secondary | ICD-10-CM | POA: Diagnosis not present

## 2017-10-11 DIAGNOSIS — S52532A Colles' fracture of left radius, initial encounter for closed fracture: Secondary | ICD-10-CM

## 2017-10-11 NOTE — Telephone Encounter (Signed)
Both of these are weight-bearing as tolerated.

## 2017-10-11 NOTE — Telephone Encounter (Signed)
Patient states she was seen today and forgot to ask the weightbearing status for her Left hand and Right leg.  CB [phone]

## 2017-10-11 NOTE — Telephone Encounter (Signed)
Please advise 

## 2017-10-11 NOTE — Progress Notes (Signed)
Office Visit Note   Patient: Allison Beasley           Date of Birth: [birth year]           MRN: [id number] Visit Date: 10/11/2017              Requested by: Alain Honey, MD [address], [place] [address] PCP: Alain Honey, MD   Assessment & Plan: Visit Diagnoses:  1. Injury of left wrist, initial encounter   2. Closed Colles' fracture of left radius, initial encounter     Plan: Given the fact that she is 3 weeks into this injury and that the fracture displacement is minimal as well as only causing minimal pain on her nondominant wrist to we will put her in a Velcro wrist splint today that she can remove for hygiene purposes.  We will see her back in 4 weeks to see how is doing overall with a repeat AP and lateral of her left wrist.  Of note her hip is doing well from her right hip replacement to treat her femoral neck fracture.  She is ambulate with a walker and needs to continue walker if she is a fall risk at all.  When I see her back in 4 weeks we do not need to x-ray the pelvis just the left wrist.  Follow-Up Instructions: Return in about 4 weeks (around 11/08/2017).   Orders:  Orders Placed This Encounter  Procedures  . XR Wrist Complete Left   No orders of the defined types were placed in this encounter.     Procedures: No procedures performed   Clinical Data: No additional findings.   Subjective: No chief complaint on file. Patient is well-knownis a 56 year old who is 45 days into a right total hip arthroplasty to treat a displaced femoral neck fracture.  However since we saw her she had a mechanical fall injuring her left wrist.  She was seen in the emergency room by the ER physicians and placed in a sugar tong splint to treat a distal radius fracture.  We removed the splint today and she does report some wrist pain.  She is 18 days into that injury.  She says the pain is not severe.  She denies any numbness tingling in her hand.  She  says her right hip is doing well.  HPI  Review of Systems She currently denies any headache, chest pain, fever, chills, nausea, vomiting  Objective: Vital Signs: LMP 06/20/2012   Physical Exam She is alert and oriented x3 and in no acute distress Ortho Exam Examination of her right hip shows fluid internal extra rotation with the right hip with no issues at all.  There is no seroma her incision is well-healed.  Examination of her left wrist shows just slight swelling of the wrist.  There is no gross deformities other than the slight swelling.  I can flex and extend the wrist and there is she has weak grip strength but she is neurovascular intact otherwise. Specialty Comments:  No specialty comments available.  Imaging: Xr Wrist Complete Left  Result Date: 10/11/2017 3 views of the left wrist show an extra-articular distal radius fracture with just slight shortening and slight dorsal angulation.    PMFS History: Patient Active Problem List   Diagnosis Date Noted  . Closed right hip fracture (HCC) 08/27/2017  . RA (rheumatoid arthritis) (HCC) 08/27/2017  . Depression 08/27/2017  . Closed fracture of neck of right femur (HCC)   .  Closed nondisplaced fracture of lateral end of right clavicle   . Fall   . Knee pain, right   . Pressure injury of skin 05/19/2017  . PAD (peripheral artery disease) (HCC) 05/17/2017  . Abscess of right foot 10/27/2015  . Ulcer of foot (HCC) 08/19/2015  . Infection 07/21/2015  . Toe infection 05/19/2015  . Pain of great toe 05/07/2015  . Peripheral vascular disease, unspecified (HCC) 04/24/2014  . Pain of right lower extremity 04/10/2014  . Sensation of cold in leg- Right Leg and foot 04/10/2014  . Amputated great toe (HCC) 04/24/2013  . Open wound of great toe 03/21/2013  . Aftercare following surgery of the circulatory system, NEC 02/14/2013  . 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  Atherosclerosis of native arteries of the extremities with gangrene (HCC) 01/24/2013  . Dyspnea 01/13/2013  . Elevated troponin borderline -- no true elevation, resolved. 01/13/2013  . Raynaud's disease 01/13/2013  . Polymyositis (HCC) 01/13/2013  . Fibromyalgia 01/13/2013  . Anemia, iron deficiency 10/25/2012  . GERD (gastroesophageal reflux disease) 07/30/2012  . Interstitial lung disease (HCC) 07/30/2012  . PNA (pneumonia) 04/09/2012  . Polymyositis associated with autoimmune disease (HCC) 08/15/2011   Past Medical History:  Diagnosis Date  . Anemia   . Anxiety   . Constipation   . Depression   . Fibromyalgia   . GERD (gastroesophageal reflux disease)   . H. influenzae infection 2018  . Headache(784.0)   . Hypertension   . Interstitial pulmonary disease, unspecified (HCC) 2018  . Open wound of left buttock 56/2018  . Osteoarthritis   . Osteopenia   . PAD (peripheral artery disease) (HCC)   . Peripheral vascular disease (HCC)   . Pneumonia 03/2012  . Polymyositis (HCC)   . Pressure ulcer of buttock 02/2017   right Buttock-   . Pressure ulcer- Left Heel 2018   unstageable   . Raynaud phenomenon   . Rheumatoid arthritis (HCC)   . Seizures (HCC)    with chid birth 40f 2 childre  . Sepsis (HCC) 2018  . Shortness of breath    with exertion  . Type II diabetes mellitus (HCC)    on no medications    Family History  Problem Relation Age of Onset  . Diabetes Mother   . Hypertension Mother   . Diabetes Father   . Heart disease Father   . Hypertension Sister   . Heart disease Sister     Past Surgical History:  Procedure Laterality Date  . ABDOMINAL AORTAGRAM N/A 07/25/2014   Procedure: ABDOMINAL Ronny Flurry;  Surgeon: Sherren Kerns, MD;  Location: [facility] CATH LAB;  Service: Cardiovascular;  Laterality: N/A;  . AMPUTATION Left 12/24/2012   Procedure: AMPUTATION DIGIT left great toe;  Surgeon: Sherren Kerns, MD;  Location: [facility] OR;  Service: Vascular;  Laterality: Left;   GREAT  . AMPUTATION Right 05/19/2015   Procedure: AMPUTATION DIGIT;  Surgeon: Sherren Kerns, MD;  Location: [facility] OR;  Service: Vascular;  Laterality: Right;  . AMPUTATION Right 07/22/2015   Procedure: RAY AMPUTATION OF RIGHT GREAT TOE AMPUTATION SITE;  Surgeon: Sherren Kerns, MD;  Location: [facility] OR;  Service: Vascular;  Laterality: Right;  . AMPUTATION Left 05/17/2017   Procedure: AMPUTATION LEFT FIFTH TOE;  Surgeon: Sherren Kerns, MD;  Location: [facility] OR;  Service: Vascular;  Laterality: Left;  . APPLICATION OF A-CELL OF EXTREMITY Right 11/12/2015   Procedure: INTEGRA;  Surgeon: Alena Bills Dillingham, DO;  Location:  [facility] OR;  Service: Plastics;  Laterality: Right;  . CESAREAN SECTION     x3  . COLONOSCOPY    . HYSTEROSCOPY     w/ D&C with excision of endometrial polyps  . I&D EXTREMITY Right 10/26/2015   Procedure: IRRIGATION AND DEBRIDEMENT EXTREMITY/RIGHT FOOT;  Surgeon: Fransisco Hertz, MD;  Location: [facility] OR;  Service: Vascular;  Laterality: Right;  . I&D EXTREMITY Right 10/29/2015   Procedure: IRRIGATION AND DEBRIDEMENT RIGHT FOOT WITH ACELL AND VAC PLACEMENT;  Surgeon: Alena Bills Dillingham, DO;  Location: [facility] OR;  Service: Plastics;  Laterality: Right;  . I&D EXTREMITY Right 11/12/2015   Procedure: IRRIGATION AND DEBRIDEMENT RIGHT EXTREMITY;  Surgeon: Alena Bills Dillingham, DO;  Location: [facility] OR;  Service: Plastics;  Laterality: Right;  . IRRIGATION AND DEBRIDEMENT FOOT Right 10/26/2015  . left great toenail removal  Left 2013   done by [place] [facility]  . MINOR APPLICATION OF WOUND VAC Right 11/12/2015   Procedure: MINOR APPLICATION OF WOUND VAC;  Surgeon: Alena Bills Dillingham, DO;  Location: [facility] OR;  Service: Plastics;  Laterality: Right;  . PERIPHERAL VASCULAR CATHETERIZATION N/A 05/15/2015   Procedure: Abdominal Aortogram;  Surgeon: Sherren Kerns, MD;  Location: [facility] INVASIVE CV LAB;  Service: Cardiovascular;  Laterality: N/A;  . SKIN SPLIT GRAFT Right 12/02/2015   Procedure: SKIN GRAFT SPLIT THICKNESS  RIGHT FOOT WITH PLACEMENT VAC DRESSING;  Surgeon: Alena Bills Dillingham, DO;  Location: [facility] OR;  Service: Plastics;  Laterality: Right;  . TOE AMPUTATION Left 05/17/2017   5th toe  . TONSILLECTOMY Bilateral   . TOTAL HIP ARTHROPLASTY Right 08/27/2017  . TOTAL HIP ARTHROPLASTY Right 08/27/2017   Procedure: TOTAL HIP ARTHROPLASTY ANTERIOR APPROACH;  Surgeon: [name], MD;  Location: [facility] OR;  Service: Orthopedics;  Laterality: Right;  . TUBAL LIGATION     Social History   Occupational History  . Not on file  Tobacco Use  . Smoking status: Never Smoker  . Smokeless tobacco: Never Used  Substance and Sexual Activity  . Alcohol use: No    Alcohol/week: 0.0 oz    Comment: 05/19/2015 "might have a couple drinks/yr"  . Drug use: No  . Sexual activity: Not on file

## 2017-10-12 NOTE — Telephone Encounter (Signed)
Patient aware of the below message  

## 2017-11-09 ENCOUNTER — Encounter (INDEPENDENT_AMBULATORY_CARE_PROVIDER_SITE_OTHER): Payer: Self-pay | Admitting: Orthopaedic Surgery

## 2017-11-09 ENCOUNTER — Ambulatory Visit (INDEPENDENT_AMBULATORY_CARE_PROVIDER_SITE_OTHER): Payer: BLUE CROSS/BLUE SHIELD | Admitting: Orthopaedic Surgery

## 2017-11-09 ENCOUNTER — Ambulatory Visit (INDEPENDENT_AMBULATORY_CARE_PROVIDER_SITE_OTHER): Payer: BLUE CROSS/BLUE SHIELD

## 2017-11-09 DIAGNOSIS — S52532A Colles' fracture of left radius, initial encounter for closed fracture: Secondary | ICD-10-CM | POA: Insufficient documentation

## 2017-11-09 DIAGNOSIS — Z96641 Presence of right artificial hip joint: Secondary | ICD-10-CM | POA: Diagnosis not present

## 2017-11-09 DIAGNOSIS — S52532D Colles' fracture of left radius, subsequent encounter for closed fracture with routine healing: Secondary | ICD-10-CM | POA: Diagnosis not present

## 2017-11-09 NOTE — Progress Notes (Signed)
                                                         The patient is a 57 year old who is now 74 days status post a mechanical fall which she sustained a right displaced femoral neck fracture and a left distal radius fracture.  We treated the right hip fracture with a total hip arthroplasty.  The left wrist was treated nonoperative with casting and now removable splint.  She says both sides are doing well.  His exam was a walker but walks without it fine she says she is doing well and range of motion and strength are increasing in the Velcro wrist brace is been fine and she does need to use it much she says she has no wrist pain.  On examination of her left wrist she has minimal pain and discomfort with flexion extension as well as rotation which are all full.  Pronation supination is full.  She has good grip strength as well.  2 views of the left wrist are obtained and can see there is slight shortening of the fracture but clinically she is doing well and the fracture does appear radiographically healed and clinically healed.  Examination of right hip shows fluid range of motion the hip with no pain at all.  At this point we will see her back in 6 months just for an AP and lateral of her right hip.  We do not need to see the pelvis or other views.  All questions concerns were answered and addressed.  She can stop a Velcro wrist splint as well.

## 2018-05-09 ENCOUNTER — Ambulatory Visit (INDEPENDENT_AMBULATORY_CARE_PROVIDER_SITE_OTHER): Payer: BLUE CROSS/BLUE SHIELD | Admitting: Orthopaedic Surgery

## 2018-05-17 ENCOUNTER — Ambulatory Visit (INDEPENDENT_AMBULATORY_CARE_PROVIDER_SITE_OTHER): Payer: BLUE CROSS/BLUE SHIELD

## 2018-05-17 ENCOUNTER — Ambulatory Visit (INDEPENDENT_AMBULATORY_CARE_PROVIDER_SITE_OTHER): Payer: BLUE CROSS/BLUE SHIELD | Admitting: Orthopaedic Surgery

## 2018-05-17 ENCOUNTER — Encounter (INDEPENDENT_AMBULATORY_CARE_PROVIDER_SITE_OTHER): Payer: Self-pay | Admitting: Orthopaedic Surgery

## 2018-05-17 DIAGNOSIS — Z96641 Presence of right artificial hip joint: Secondary | ICD-10-CM

## 2018-05-17 NOTE — Progress Notes (Signed)
The patient is very well-known to me.  She is a 57 year old female who is now 9 months status post a right total hip arthroplasty through direct anterior approach due to an acute femoral neck fracture.  She says her hip is doing well and she has no issues with it at all.  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  This is a chronic condition.  On exam I can easily put her right hip through full internal and external rotation with no pain at all.  Her leg lengths are equal.  Incisions well-healed.  There is no evidence of infection at all.  X-rays of the right hip and pelvis show well-seated implant with no complicating features.  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  At this point she will follow-up as needed since she is doing well.  She understands if things need to bring her back for that hip or for anything else orthopedic wise if she has any issues at all.  All questions concerns were answered and addressed.

## 2019-01-21 IMAGING — MR MR FOOT*L* W/O CM
4 of 5 series · 29 of 40 positions shown · non-contrast
Comparison: None.

CLINICAL DATA: History of left great toe amputation. Patient has a
open wound along the fifth MTP joint for 3 weeks.

EXAM:
MRI OF THE LEFT FOOT WITHOUT CONTRAST
TECHNIQUE: Multiplanar, multisequence MR imaging of the left foot was
performed. No intravenous contrast was administered.

[Series 4: ti short axis · coronal · left · 4.0mm · 0.31mm/px · 9 of 37 slices shown]
[im 1/37]
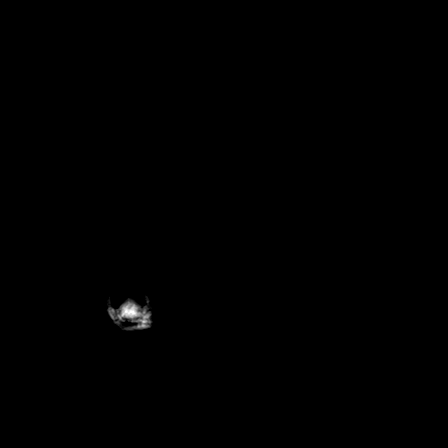
[im 5/37]
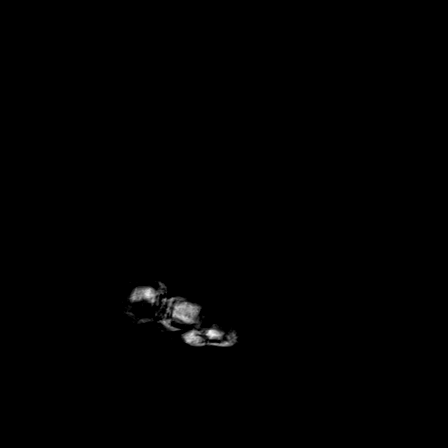
[im 10/37]
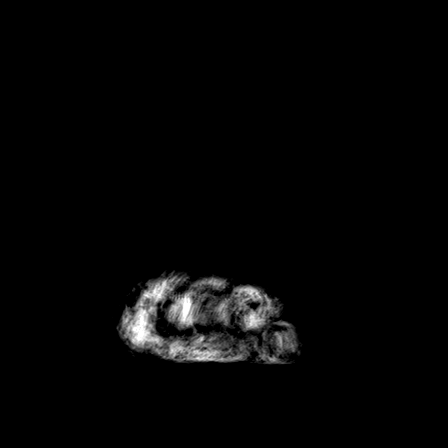
[im 14/37]
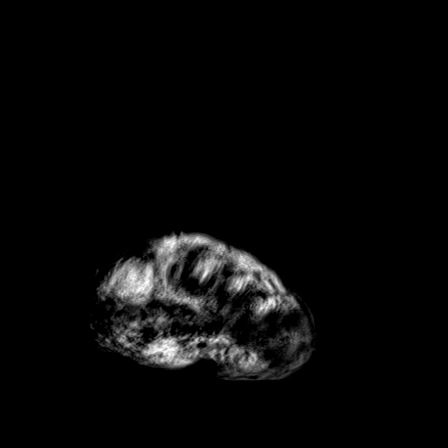
[im 19/37]
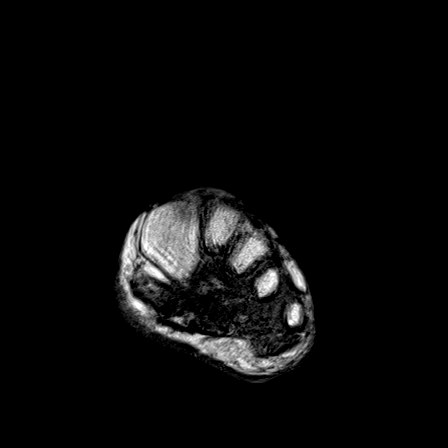
[im 23/37]
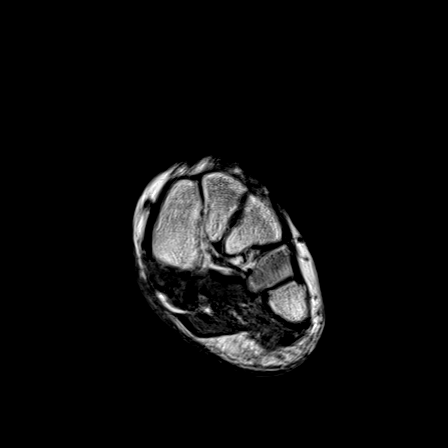
[im 28/37]
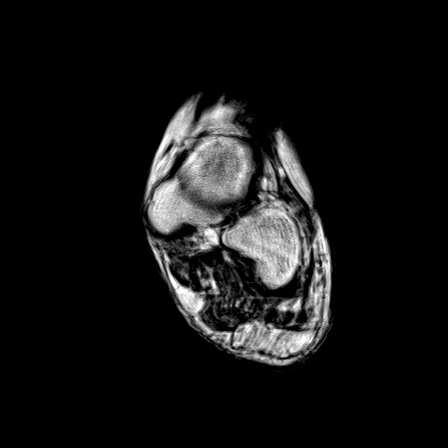
[im 32/37]
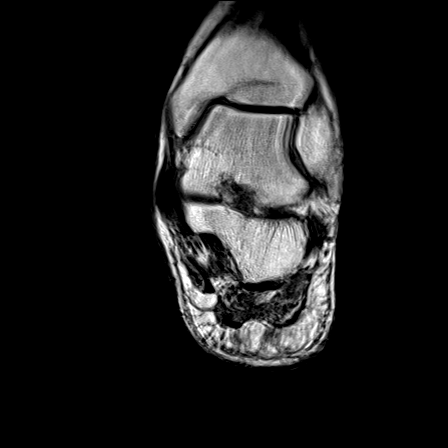
[im 37/37]
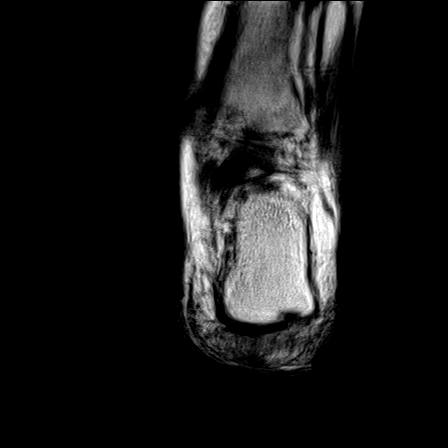

[Series 5: T2 fat-sat · coronal · left · 4.0mm · 0.39mm/px · 8 of 37 slices shown]
[im 1/37]
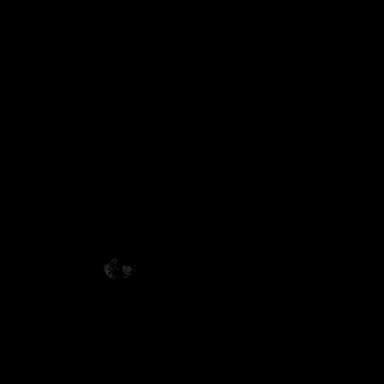
[im 5/37]
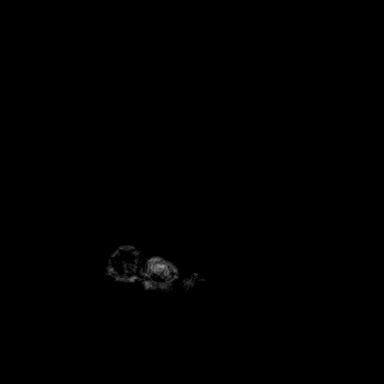
[im 13/37]
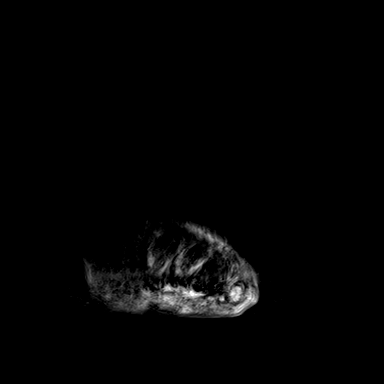
[im 17/37]
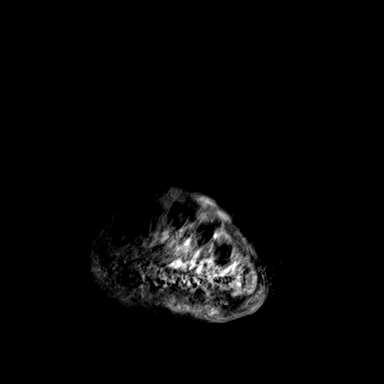
[im 21/37]
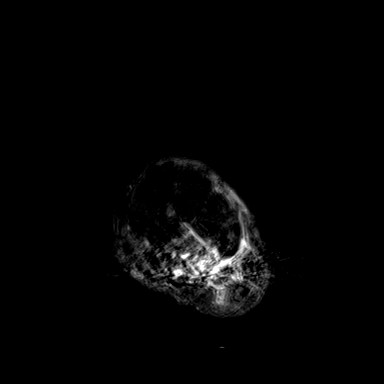
[im 25/37]
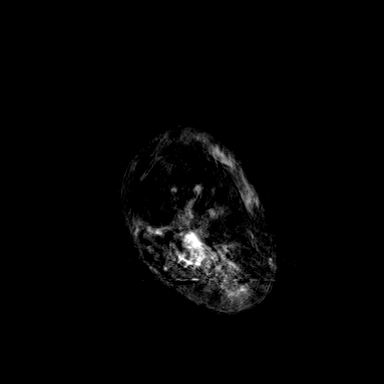
[im 33/37]
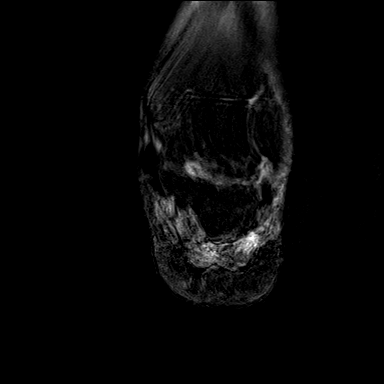
[im 37/37]
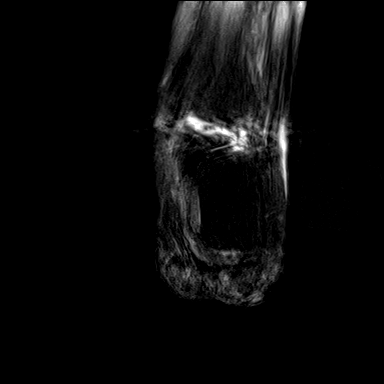

[Series 6: T1 · axial · left · 3.0mm · 0.40mm/px · z∈[-78,+17]mm · 8 of 29 slices shown]
[im 1/29]
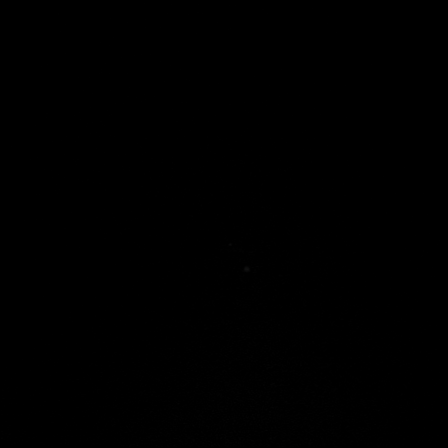
[im 5/29]
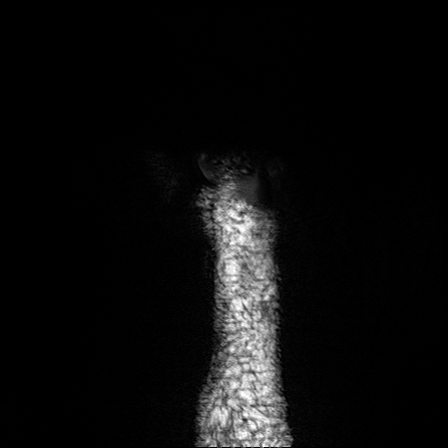
[im 9/29]
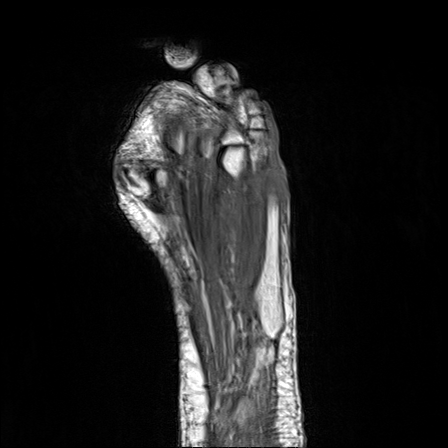
[im 13/29]
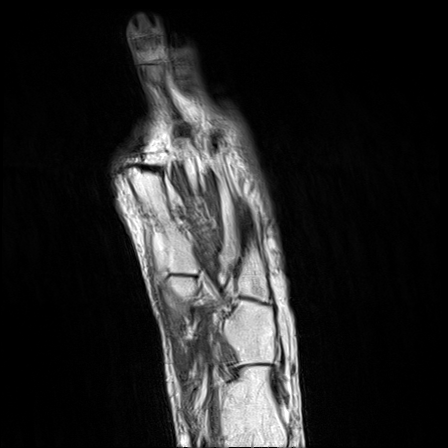
[im 17/29]
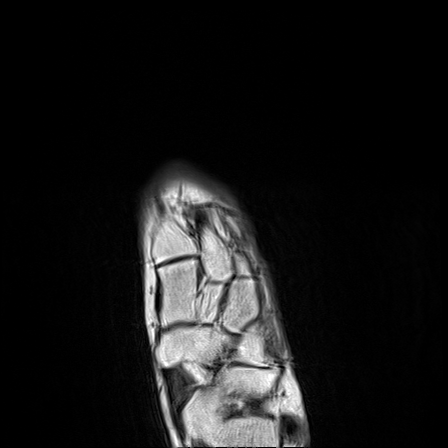
[im 21/29]
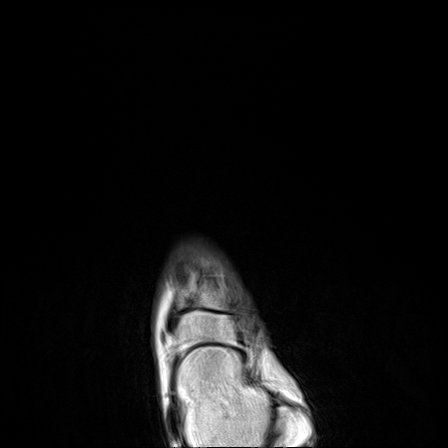
[im 25/29]
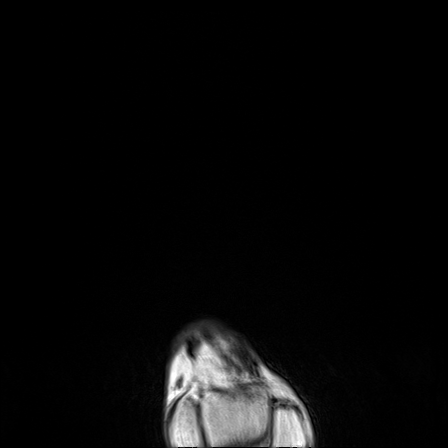
[im 29/29]
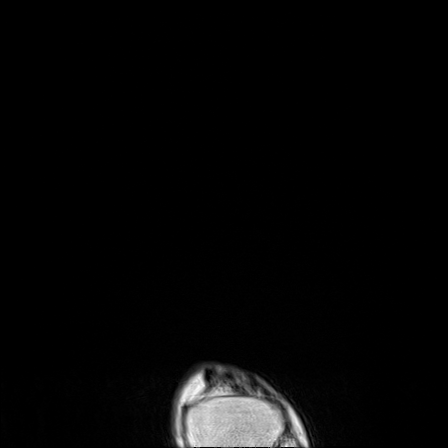

[Series 8: STIR · axial · left · 3.0mm · 0.40mm/px · z∈[-79,+3]mm · 4 of 29 slices shown]
[im 1/29]
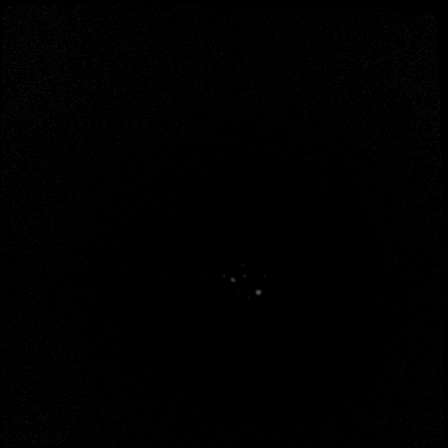
[im 5/29]
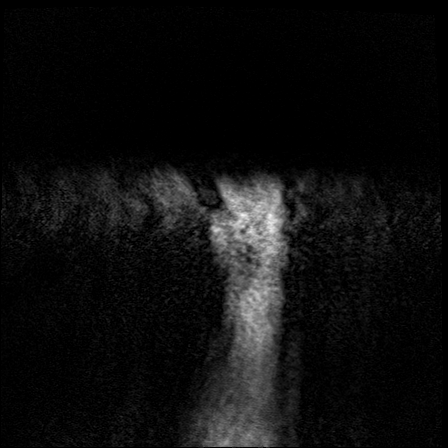
[im 17/29]
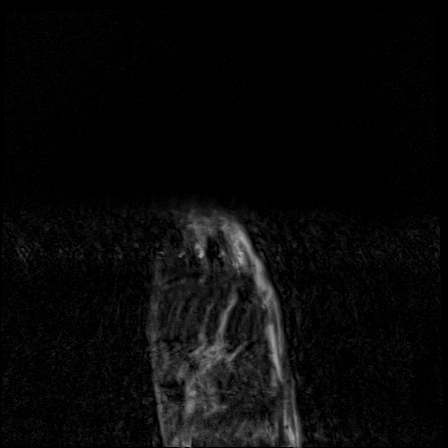
[im 25/29]
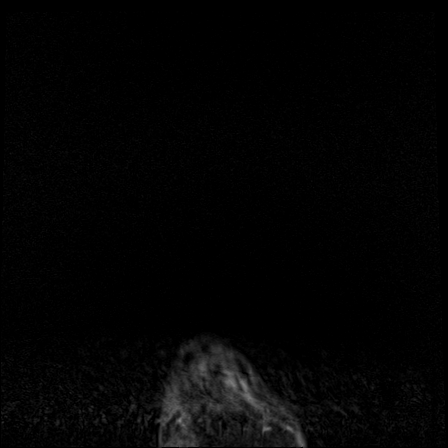

[29 of 40 positions shown; findings below may reference images not displayed]

FINDINGS: Extremely limited examination due to patient motion despite multiple
attempts and coaching, the patient would not or could not hold
still.

The only gross pertinent findings are septic arthritis at the fifth
MTP joint with osteomyelitis involving the fifth metatarsal head and
fifth proximal phalanx. There is also cellulitis and myofasciitis.
IMPRESSION: 1. Severely limited examination as discussed above.
2. Septic arthritis at the fifth MTP joint with osteomyelitis
involving the fifth metatarsal head and fifth proximal phalanx.

## 2019-05-01 ENCOUNTER — Ambulatory Visit (INDEPENDENT_AMBULATORY_CARE_PROVIDER_SITE_OTHER): Payer: BLUE CROSS/BLUE SHIELD

## 2019-05-01 ENCOUNTER — Other Ambulatory Visit: Payer: Self-pay

## 2019-05-01 ENCOUNTER — Encounter: Payer: Self-pay | Admitting: Orthopaedic Surgery

## 2019-05-01 ENCOUNTER — Ambulatory Visit (INDEPENDENT_AMBULATORY_CARE_PROVIDER_SITE_OTHER): Payer: BLUE CROSS/BLUE SHIELD | Admitting: Orthopaedic Surgery

## 2019-05-01 DIAGNOSIS — M4807 Spinal stenosis, lumbosacral region: Secondary | ICD-10-CM

## 2019-05-01 DIAGNOSIS — M79604 Pain in right leg: Secondary | ICD-10-CM

## 2019-05-01 NOTE — Progress Notes (Signed)
Office Visit Note   Patient: Allison Beasley           Date of Birth: [birth year]           MRN: [id number] Visit Date: 05/01/2019              Requested by: Alain Honey, MD [address]  [place] [address] PCP: Alain Honey, MD   Assessment & Plan: Visit Diagnoses:  1. Pain in right leg     Plan: Due to patient is waking pain radicular symptoms down the right leg and an L3 compression fracture of unknown known age but with a definite change since 2018 with no known injury recommend MRI to evaluate the L3 compression fracture and also to rule out HNP as a source of her radicular symptoms down the right leg.  Have her follow-up with [place] after the MRI to go over results discuss further treatment.  Questions encouraged and answered at length today.  Follow-Up Instructions: Return in about 1 month (around 06/01/2019) for After MRI.   Orders:  Orders Placed This Encounter  Procedures  . XR HIP UNILAT W OR W/O PELVIS 1V RIGHT  . XR Lumbar Spine 2-3 Views   No orders of the defined types were placed in this encounter.     Procedures: No procedures performed   Clinical Data: No additional findings.   Subjective: Chief Complaint  Patient presents with  . Right Leg - Pain    HPI Allison Beasley is a 58 year old female comes in today with right hip pain back pain.  This all began about a month ago.  She had no known injury to her back or hip.  She denies any fevers chills shortness of breath chest pain dysuria.  She is having right-sided back pain with numbness and pain down the right leg does extend past the knee at times.  Pain does awaken her.  She notes increased urinary frequency which is been ongoing for some time now she is diabetic and reports good control.  She also reports increased urgency of both bowel and bladder since coming out of the coma.  She takes chronic pain medications on Norco.  Patient has also history of osteopenia.   Review of  Systems  Constitutional: Negative for chills and fever.  Respiratory: Negative for shortness of breath.   Cardiovascular: Negative for chest pain.  Gastrointestinal: Negative for diarrhea.  Genitourinary: Positive for urgency. Negative for dysuria.  Musculoskeletal: Positive for back pain.  Neurological: Positive for numbness.     Objective: Vital Signs: LMP 06/20/2012   Physical Exam Constitutional:      Appearance: She is not ill-appearing or diaphoretic.  Pulmonary:     Effort: Pulmonary effort is normal.  Neurological:     Mental Status: She is alert and oriented to person, place, and time.  Psychiatric:        Mood and Affect: Mood normal.        Behavior: Behavior normal.     Ortho Exam Deep tendon reflexes are 2+ at the knees and ankles and equal and symmetric.  Straight leg raise is negative bilaterally.  Tight hamstrings bilaterally.  5 out of 5 strength throughout lower extremities against resistance.  Sensation grossly intact bilateral feet to light touch.  Status post right great toe amputation and left great toe and fifth toe amputation no impending ulcers rashes skin lesions of either foot.  Good range of motion bilateral hips without pain.  Tenderness  over the lower lumbar spinal column.  Specialty Comments:  No specialty comments available.  Imaging: No results found.   PMFS History: Patient Active Problem List   Diagnosis Date Noted  . Status post right hip replacement 11/09/2017  . Fracture, Colles, left, closed 11/09/2017  . Closed right hip fracture ([place]) 08/27/2017  . RA (rheumatoid arthritis) ([place]) 08/27/2017  . Depression 08/27/2017  . Closed fracture of neck of right femur ([place])   . Closed nondisplaced fracture of lateral end of right clavicle   . Fall   . Knee pain, right   . Pressure injury of skin 05/19/2017  . PAD (peripheral artery disease) ([place]) 05/17/2017  . Abscess of right foot 10/27/2015  . Ulcer of foot ([place]) 08/19/2015  .  Infection 07/21/2015  . Toe infection 05/19/2015  . Pain of great toe 05/07/2015  . Peripheral vascular disease, unspecified ([place]) 04/24/2014  . Pain of right lower extremity 04/10/2014  . Sensation of cold in leg- Right Leg and foot 04/10/2014  . Amputated great toe ([place]) 04/24/2013  . Open wound of great toe 03/21/2013  . Aftercare following surgery of the circulatory system, [place] 02/14/2013  . 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 Atherosclerosis of native arteries of the extremities with gangrene ([place]) 01/24/2013  . Dyspnea 01/13/2013  . Elevated troponin borderline -- no true elevation, resolved. 01/13/2013  . Raynaud's disease 01/13/2013  . Polymyositis ([place]) 01/13/2013  . Fibromyalgia 01/13/2013  . Anemia, iron deficiency 10/25/2012  . GERD (gastroesophageal reflux disease) 07/30/2012  . Interstitial lung disease ([place]) 07/30/2012  . PNA (pneumonia) 04/09/2012  . Polymyositis associated with autoimmune disease ([place]) 08/15/2011   Past Medical History:  Diagnosis Date  . Anemia   . Anxiety   . Constipation   . Depression   . Fibromyalgia   . GERD (gastroesophageal reflux disease)   . H. influenzae infection 2018  . Headache(784.0)   . Hypertension   . Interstitial pulmonary disease, unspecified ([place]) 2018  . Open wound of left buttock 56/2018  . Osteoarthritis   . Osteopenia   . PAD (peripheral artery disease) ([place])   . Peripheral vascular disease ([place])   . Pneumonia 03/2012  . Polymyositis ([place])   . Pressure ulcer of buttock 02/2017   right Buttock-   . Pressure ulcer- Left Heel 2018   unstageable   . Raynaud phenomenon   . Rheumatoid arthritis ([place])   . Seizures ([place])    with chid birth 26f 2 childre  . Sepsis ([place]) 2018  . Shortness of breath    with exertion  . Type II diabetes mellitus ([place])    on no medications    Family History  Problem Relation Age of Onset  . Diabetes Mother   . Hypertension  Mother   . Diabetes Father   . Heart disease Father   . Hypertension Sister   . Heart disease Sister     Past Surgical History:  Procedure Laterality Date  . ABDOMINAL AORTAGRAM N/A 07/25/2014   Procedure: ABDOMINAL Maxcine Ham;  Surgeon: Elam Dutch, MD;  Location: [facility] CATH LAB;  Service: Cardiovascular;  Laterality: N/A;  . AMPUTATION Left 12/24/2012   Procedure: AMPUTATION DIGIT left great toe;  Surgeon: Elam Dutch, MD;  Location: [facility];  Service: Vascular;  Laterality: Left;  GREAT  . AMPUTATION Right 05/19/2015   Procedure: AMPUTATION DIGIT;  Surgeon: Elam Dutch, MD;  Location: [facility];  Service: Vascular;  Laterality: Right;  .  AMPUTATION Right 07/22/2015   Procedure: RAY AMPUTATION OF RIGHT GREAT TOE AMPUTATION SITE;  Surgeon: Sherren Kerns, MD;  Location: [facility] OR;  Service: Vascular;  Laterality: Right;  . AMPUTATION Left 05/17/2017   Procedure: AMPUTATION LEFT FIFTH TOE;  Surgeon: Sherren Kerns, MD;  Location: [facility] OR;  Service: Vascular;  Laterality: Left;  . APPLICATION OF A-CELL OF EXTREMITY Right 11/12/2015   Procedure: INTEGRA;  Surgeon: Alena Bills Dillingham, DO;  [facility] OR;  Service: Plastics;  Laterality: Right;  . CESAREAN SECTION     x3  . COLONOSCOPY    . HYSTEROSCOPY     w/ D&C with excision of endometrial polyps  . I&D EXTREMITY Right 10/26/2015   Procedure: IRRIGATION AND DEBRIDEMENT EXTREMITY/RIGHT FOOT;  Surgeon: Fransisco Hertz, MD;  Location: [facility] OR;  Service: Vascular;  Laterality: Right;  . I&D EXTREMITY Right 10/29/2015   Procedure: IRRIGATION AND DEBRIDEMENT RIGHT FOOT WITH ACELL AND VAC PLACEMENT;  Surgeon: Alena Bills Dillingham, DO;  Location[facility] OR;  Service: Plastics;  Laterality: Right;  . I&D EXTREMITY Right 11/12/2015   Procedure: IRRIGATION AND DEBRIDEMENT RIGHT EXTREMITY;  Surgeon: Alena Bills Dillingham, DO;  [facility] OR;  Service: Plastics;  Laterality: Right;  . IRRIGATION AND DEBRIDEMENT FOOT Right 10/26/2015  . left great toenail removal   Left 2013   done by [place] [facility]  . MINOR APPLICATION OF WOUND VAC Right 11/12/2015   Procedure: MINOR APPLICATION OF WOUND VAC;  Surgeon: Alena Bills Dillingham, DO;  Location: [facility] OR;  Service: Plastics;  Laterality: Right;  . PERIPHERAL VASCULAR CATHETERIZATION N/A 05/15/2015   Procedure: Abdominal Aortogram;  Surgeon: Sherren Kerns, MD;  Location: [facility] INVASIVE CV LAB;  Service: Cardiovascular;  Laterality: N/A;  . SKIN SPLIT GRAFT Right 12/02/2015   Procedure: SKIN GRAFT SPLIT THICKNESS RIGHT FOOT WITH PLACEMENT VAC DRESSING;  Surgeon: Alena Bills Dillingham, DO;  Location[facility] OR;  Service: Plastics;  Laterality: Right;  . TOE AMPUTATION Left 05/17/2017   5th toe  . TONSILLECTOMY Bilateral   . TOTAL HIP ARTHROPLASTY Right 08/27/2017  . TOTAL HIP ARTHROPLASTY Right 08/27/2017   Procedure: TOTAL HIP ARTHROPLASTY ANTERIOR APPROACH;  Surgeon: [name], MD;  Location[facility] OR;  Service: Orthopedics;  Laterality: Right;  . TUBAL LIGATION     Social History   Occupational History  . Not on file  Tobacco Use  . Smoking status: Never Smoker  . Smokeless tobacco: Never Used  Substance and Sexual Activity  . Alcohol use: No    Alcohol/week: 0.0 standard drinks    Comment: 05/19/2015 "might have a couple drinks/yr"  . Drug use: No  . Sexual activity: Not on file

## 2019-05-20 ENCOUNTER — Encounter: Payer: Self-pay | Admitting: Orthopaedic Surgery

## 2019-05-21 ENCOUNTER — Ambulatory Visit (INDEPENDENT_AMBULATORY_CARE_PROVIDER_SITE_OTHER): Payer: Medicare Other | Admitting: Orthopaedic Surgery

## 2019-05-21 DIAGNOSIS — M4807 Spinal stenosis, lumbosacral region: Secondary | ICD-10-CM

## 2019-05-27 ENCOUNTER — Telehealth: Payer: Self-pay | Admitting: Orthopaedic Surgery

## 2019-05-27 NOTE — Telephone Encounter (Signed)
Patient -returned your call. Request for you to call her back. [phone]

## 2019-05-27 NOTE — Telephone Encounter (Signed)
I don't see her MRI anywhere? Not sure where she had it done. But best to come here for a visit to followup, not over phones.

## 2019-05-27 NOTE — Telephone Encounter (Signed)
Patient returned call

## 2019-05-27 NOTE — Telephone Encounter (Signed)
Patient called wanting to get the results of her MRI.  CB#[phone].  Thank you.

## 2019-05-28 ENCOUNTER — Other Ambulatory Visit: Payer: Self-pay

## 2019-05-28 DIAGNOSIS — M4807 Spinal stenosis, lumbosacral region: Secondary | ICD-10-CM

## 2019-05-28 NOTE — Telephone Encounter (Signed)
Called patient will setup for consult with Dr. Ernestina Patches

## 2019-05-28 NOTE — Telephone Encounter (Signed)
Order put in chart

## 2019-05-28 NOTE — Telephone Encounter (Signed)
It looks like she had her MRI at [facility] in [place] on July 23

## 2019-07-30 ENCOUNTER — Encounter: Payer: Self-pay | Admitting: Emergency Medicine

## 2019-07-30 ENCOUNTER — Other Ambulatory Visit: Payer: Self-pay

## 2019-07-30 ENCOUNTER — Emergency Department (INDEPENDENT_AMBULATORY_CARE_PROVIDER_SITE_OTHER)
Admission: EM | Admit: 2019-07-30 | Discharge: 2019-07-30 | Disposition: A | Discharge status: Home / Self Care | Payer: BLUE CROSS/BLUE SHIELD | Source: Home / Self Care | Attending: Family Medicine | Admitting: Family Medicine

## 2019-07-30 DIAGNOSIS — R519 Headache, unspecified: Secondary | ICD-10-CM | POA: Diagnosis not present

## 2019-07-30 NOTE — ED Triage Notes (Signed)
PT reports her vision fogged over from 12:30 - 2:30 and she was unable to see anything past her hand in front of her face.   Headache started behind right eye at 3pm

## 2019-07-30 NOTE — ED Provider Notes (Signed)
[facility]    CSN: [id number] Arrival date & time: 07/30/19  1701      History   Chief Complaint Chief Complaint  Patient presents with  . Headache  . Eye Problem    HPI Allison Beasley is a 58 y.o. female.   Patient reports that about 5 hours ago she suddenly experienced "tunnel vision" with "foggy" peripheral vision that lasted about 2 hours.  At the onset she had difficulty visualizing anything beyond about 2 feet in front of her face.  As her vision improved she developed a dull right headache behind her right eye that has persisted.  She feels that her vision symptoms have resolved but she still has the headache.  She denies other neurologic symptoms. She has a history of polymyositis for which she takes 5mg  prednisone daily.  The history is provided by the patient.    Past Medical History:  Diagnosis Date  . Anemia   . Anxiety   . Constipation   . Depression   . Fibromyalgia   . GERD (gastroesophageal reflux disease)   . H. influenzae infection 2018  . Headache(784.0)   . Hypertension   . Interstitial pulmonary disease, unspecified (HCC) 2018  . Open wound of left buttock 56/2018  . Osteoarthritis   . Osteopenia   . PAD (peripheral artery disease) (HCC)   . Peripheral vascular disease (HCC)   . Pneumonia 03/2012  . Polymyositis (HCC)   . Pressure ulcer of buttock 02/2017   right Buttock-   . Pressure ulcer- Left Heel 2018   unstageable   . Raynaud phenomenon   . Rheumatoid arthritis (HCC)   . Seizures (HCC)    with chid birth 40f 2 childre  . Sepsis (HCC) 2018  . Shortness of breath    with exertion  . Type II diabetes mellitus (HCC)    on no medications    Patient Active Problem List   Diagnosis Date Noted  . Status post right hip replacement 11/09/2017  . Fracture, [place], left, closed 11/09/2017  . Closed right hip fracture (HCC) 08/27/2017  . RA (rheumatoid arthritis) (HCC) 08/27/2017  . Depression 08/27/2017  . Closed fracture of  neck of right femur (HCC)   . Closed nondisplaced fracture of lateral end of right clavicle   . Fall   . Knee pain, right   . Pressure injury of skin 05/19/2017  . PAD (peripheral artery disease) (HCC) 05/17/2017  . Abscess of right foot 10/27/2015  . Ulcer of foot (HCC) 08/19/2015  . Infection 07/21/2015  . Toe infection 05/19/2015  . Pain of great toe 05/07/2015  . Peripheral vascular disease, unspecified (HCC) 04/24/2014  . Pain of right lower extremity 04/10/2014  . Sensation of cold in leg- Right Leg and foot 04/10/2014  . Amputated great toe (HCC) 04/24/2013  . Open wound of great toe 03/21/2013  . Aftercare following surgery of the circulatory system, NEC 02/14/2013  . 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 Atherosclerosis of native arteries of the extremities with gangrene (HCC) 01/24/2013  . Dyspnea 01/13/2013  . Elevated troponin borderline -- no true elevation, resolved. 01/13/2013  . Raynaud's disease 01/13/2013  . Polymyositis (HCC) 01/13/2013  . Fibromyalgia 01/13/2013  . Anemia, iron deficiency 10/25/2012  . GERD (gastroesophageal reflux disease) 07/30/2012  . Interstitial lung disease (HCC) 07/30/2012  . PNA (pneumonia) 04/09/2012  . 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 ABDOMINAL AORTAGRAM N/A 07/25/2014   Procedure: ABDOMINAL Ronny Flurry;  Surgeon: Sherren Kerns, MD;  Location: [facility] CATH LAB;  Service: Cardiovascular;  Laterality: N/A;  . AMPUTATION Left 12/24/2012   Procedure: AMPUTATION DIGIT left great toe;  Surgeon: Sherren Kerns, MD;  Location: [facility] OR;  Service: Vascular;  Laterality: Left;  GREAT  . AMPUTATION Right 05/19/2015   Procedure: AMPUTATION DIGIT;  Surgeon: Sherren Kerns, MD;  Location: [facility] OR;  Service: Vascular;  Laterality: Right;  . AMPUTATION Right 07/22/2015   Procedure: RAY AMPUTATION OF RIGHT GREAT TOE  AMPUTATION SITE;  Surgeon: Sherren Kerns, MD;  Location: [facility] OR;  Service: Vascular;  Laterality: Right;  . AMPUTATION Left 05/17/2017   Procedure: AMPUTATION LEFT FIFTH TOE;  Surgeon: Sherren Kerns, MD;  Location: [facility] OR;  Service: Vascular;  Laterality: Left;  . APPLICATION OF A-CELL OF EXTREMITY Right 11/12/2015   Procedure: INTEGRA;  Surgeon: Alena Bills Dillingham, DO;  Location: [facility] OR;  Service: Plastics;  Laterality: Right;  . CESAREAN SECTION     x3  . COLONOSCOPY    . HYSTEROSCOPY     w/ D&C with excision of endometrial polyps  . I&D EXTREMITY Right 10/26/2015   Procedure: IRRIGATION AND DEBRIDEMENT EXTREMITY/RIGHT FOOT;  Surgeon: Fransisco Hertz, MD;  Location: [facility] OR;  Service: Vascular;  Laterality: Right;  . I&D EXTREMITY Right 10/29/2015   Procedure: IRRIGATION AND DEBRIDEMENT RIGHT FOOT WITH ACELL AND VAC PLACEMENT;  Surgeon: Alena Bills Dillingham, DO;  Location: [facility] OR;  Service: Plastics;  Laterality: Right;  . I&D EXTREMITY Right 11/12/2015   Procedure: IRRIGATION AND DEBRIDEMENT RIGHT EXTREMITY;  Surgeon: Alena Bills Dillingham, DO;  Location: [facility] OR;  Service: Plastics;  Laterality: Right;  . IRRIGATION AND DEBRIDEMENT FOOT Right 10/26/2015  . left great toenail removal  Left 2013   done by [place] [facility]  . MINOR APPLICATION OF WOUND VAC Right 11/12/2015   Procedure: MINOR APPLICATION OF WOUND VAC;  Surgeon: Alena Bills Dillingham, DO;  Location: [facility] OR;  Service: Plastics;  Laterality: Right;  . PERIPHERAL VASCULAR CATHETERIZATION N/A 05/15/2015   Procedure: Abdominal Aortogram;  Surgeon: Sherren Kerns, MD;  Location: [facility] INVASIVE CV LAB;  Service: Cardiovascular;  Laterality: N/A;  . SKIN SPLIT GRAFT Right 12/02/2015   Procedure: SKIN GRAFT SPLIT THICKNESS RIGHT FOOT WITH PLACEMENT VAC DRESSING;  Surgeon: Alena Bills Dillingham, DO;  Location: [facility] OR;  Service: Plastics;  Laterality: Right;  . TOE AMPUTATION Left 05/17/2017   5th toe  . TONSILLECTOMY Bilateral   . TOTAL HIP ARTHROPLASTY  Right 08/27/2017  . TOTAL HIP ARTHROPLASTY Right 08/27/2017   Procedure: TOTAL HIP ARTHROPLASTY ANTERIOR APPROACH;  Surgeon: [name], MD;  Location: [facility] OR;  Service: Orthopedics;  Laterality: Right;  . TUBAL LIGATION      OB History    Gravida  4   Para  3   Term      Preterm      AB  1   Living  3     SAB      TAB      Ectopic      Multiple      Live Births               Home Medications    Prior to Admission medications   Medication Sig Start Date End Date Taking? Authorizing Provider  Cholecalciferol 1000 units tablet Take 1,000 Units by mouth daily. (0900)   Yes [provider]  DULoxetine (CYMBALTA) 30 MG capsule Take 30 mg by mouth daily. 08/22/17  Yes [provider]  famotidine (PEPCID) 20 MG tablet Take 20 mg by mouth 2 (two) times daily.   Yes [provider]  metoprolol tartrate (LOPRESSOR) 25 MG tablet Take 25 mg by mouth 2 (two) times daily.    Yes [provider]  amLODipine (NORVASC) 5 MG tablet Take 5 mg by mouth daily. (0900)    [provider]  aspirin EC 81 MG tablet Take 1 tablet (81 mg total) 2 (two) times daily after a meal by mouth. (0900) 08/29/17   Mcarthur Rossetti, MD  feeding supplement, ENSURE ENLIVE, (ENSURE ENLIVE) LIQD Take 237 mLs 2 (two) times daily between meals by mouth. 08/31/17   Arrien, Jimmy Picket, MD  Melatonin 3 MG TABS Take 3 mg by mouth at bedtime.    [provider]  Multiple Vitamin (MULTIVITAMIN) tablet Take 1 tablet by mouth daily. (0900)    [provider]  Omega-3 Fatty Acids (FISH OIL) 1000 MG CAPS Take 1,000 mg by mouth daily. (0900)    [provider]  oxyCODONE-acetaminophen (ROXICET) 5-325 MG tablet Take 1-2 tablets every 4 (four) hours as needed by mouth. 08/29/17   Mcarthur Rossetti, MD  polyethylene glycol [facility] / Floria Raveling) packet Take 17 g 2 (two) times daily by mouth. 08/31/17   Arrien, Jimmy Picket, MD   predniSONE (DELTASONE) 5 MG tablet Take 5 mg by mouth daily with breakfast.    [provider]  risperiDONE (RISPERDAL) 0.5 MG tablet Take 0.5 mg by mouth at bedtime. (2100)    [provider]  sennosides-docusate sodium (SENOKOT-S) 8.6-50 MG tablet Take 1 tablet by mouth at bedtime.    [provider]  Skin Protectants, Misc. (EUCERIN) cream Apply 1 application topically daily. Apply to whole body    [provider]    Family History Family History  Problem Relation Age of Onset  . Diabetes Mother   . Hypertension Mother   . Diabetes Father   . Heart disease Father   . Hypertension Sister   . Heart disease Sister     Social History Social History   Tobacco Use  . Smoking status: Never Smoker  . Smokeless tobacco: Never Used  Substance Use Topics  . Alcohol use: No    Alcohol/week: 0.0 standard drinks    Comment: 05/19/2015 "might have a couple drinks/yr"  . Drug use: No     Allergies   Clindamycin/lincomycin, Doxycycline, Percocet [oxycodone-acetaminophen], Ciprofloxacin, and Mycophenolate   Review of Systems Review of Systems  Constitutional: Positive for activity change. Negative for appetite change, chills, diaphoresis, fatigue and fever.  HENT: Negative.   Eyes: Positive for visual disturbance.  Respiratory: Negative.   Cardiovascular: Negative.   Gastrointestinal: Negative.   Genitourinary: Negative.   Musculoskeletal: Negative.   Skin: Negative.   Neurological: Positive for headaches. Negative for dizziness, tremors, seizures, syncope, facial asymmetry, speech difficulty, weakness, light-headedness and numbness.     Physical Exam Triage Vital Signs ED Triage Vitals  Enc Vitals Group     BP 07/30/19 1722 (!) 157/86     Pulse Rate 07/30/19 1722 (!) 103     Resp 07/30/19 1722 18     Temp 07/30/19 1722 98.5 F (36.9 C)     Temp Source 07/30/19 1722 Oral     SpO2 07/30/19 1722 100 %     Weight --      Height --  Head Circumference --      Peak Flow --      Pain Score 07/30/19 1723 7     Pain Loc --      Pain Edu? --      Excl. in GC? --    No data found.  Updated Vital Signs BP (!) 157/86   Pulse (!) 103   Temp 98.5 F (36.9 C) (Oral)   Resp 18   LMP 06/20/2012   SpO2 100%   Visual Acuity Right Eye Distance:   Left Eye Distance:   Bilateral Distance:    Right Eye Near:   Left Eye Near:    Bilateral Near:     Physical Exam Nursing notes and Vital Signs reviewed. Appearance:  Patient appears stated age, and in no acute distress.  She is alert and oriented.  Eyes:  Pupils are equal, round, and reactive to light and accomodation.  Extraocular movement is intact.  Conjunctivae are not inflamed.  Fundi are benign.  Ears:  Canals normal.  Tympanic membranes normal.  Nose:  Normal turbinates.  No sinus tenderness.  Pharynx:  Normal Neck:  Supple. No thyromegaly or adenopathy.  Carotids have normal upstrokes without bruits. Lungs:  Clear to auscultation.  Breath sounds are equal.  Moving air well. Heart:  Regular rate and rhythm without murmurs, rubs, or gallops.  Abdomen:  Nontender without masses or hepatosplenomegaly.  Bowel sounds are present.  No CVA or flank tenderness.  Extremities:  No edema.  Skin:  No rash present.   Neurologic:  Cranial nerves 2 through 12 are normal.  Patellar, achilles, and elbow reflexes are normal.  Cerebellar function is intact (finger-to-nose and rapid alternating hand movement).  Gait and station are normal.  Grip strength symmetric bilaterally.  Romberg negative.  Visual fields are intact.  UC Treatments / Results  Labs (all labs ordered are listed, but only abnormal results are displayed) Labs Reviewed - No data to display  EKG   Radiology No results found.  Procedures Procedures (including critical care time)  Medications Ordered in UC Medications - No data to display  Initial Impression / Assessment and Plan / UC Course  I have  reviewed the triage vital signs and the nursing notes.  Pertinent labs & imaging results that were available during my care of the patient were reviewed by me and considered in my medical decision making (see chart for details).    ?TIA.  Normal neurologic exam reassuring. [facility] (husband to drive).  Vital signs stable.   Final Clinical Impressions(s) / UC Diagnoses   Final diagnoses:  Right sided temporal headache   Discharge Instructions   None    ED Prescriptions    None        Lattie HawBeese, Stephen A, MD 08/07/19 1413

## 2020-09-30 ENCOUNTER — Ambulatory Visit: Payer: BC Managed Care – PPO | Admitting: Podiatry

## 2020-09-30 ENCOUNTER — Other Ambulatory Visit: Payer: Self-pay

## 2020-09-30 DIAGNOSIS — B351 Tinea unguium: Secondary | ICD-10-CM | POA: Diagnosis not present

## 2020-09-30 DIAGNOSIS — M778 Other enthesopathies, not elsewhere classified: Secondary | ICD-10-CM

## 2020-09-30 DIAGNOSIS — M79675 Pain in left toe(s): Secondary | ICD-10-CM | POA: Diagnosis not present

## 2020-09-30 DIAGNOSIS — E1151 Type 2 diabetes mellitus with diabetic peripheral angiopathy without gangrene: Secondary | ICD-10-CM | POA: Diagnosis not present

## 2020-09-30 DIAGNOSIS — M79674 Pain in right toe(s): Secondary | ICD-10-CM | POA: Diagnosis not present

## 2020-10-01 ENCOUNTER — Encounter: Payer: Self-pay | Admitting: Podiatry

## 2020-10-01 DIAGNOSIS — M778 Other enthesopathies, not elsewhere classified: Secondary | ICD-10-CM | POA: Diagnosis not present

## 2020-10-01 MED ORDER — TRIAMCINOLONE ACETONIDE 10 MG/ML IJ SUSP
10.0000 mg | Freq: Once | INTRAMUSCULAR | Status: AC
Start: 2020-10-01 — End: 2020-10-01
  Administered 2020-10-01: 10 mg via INTRA_ARTICULAR

## 2020-10-01 NOTE — Progress Notes (Signed)
  Subjective:  Patient ID: Allison Beasley, female    DOB: July 20, 1961,  MRN: [id number]  Chief Complaint  Patient presents with  . Callouses    Bilateral foot pain due to callouses on both feet    59 y.o. female returns for the above complaint.  Patient presents with thickened elongated dystrophic toenails x7.  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  Patient is a diabetic with A1c of 6.5.  She states that she is not able to debride herself.  She would like for me to take care of it.  She also has secondary complaint of right midfoot pain with underlying porokeratosis.  Patient states it painful to touch she would like for me to debride that down as well.  She denies any other acute complaints.  Objective:  There were no vitals filed for this visit. Podiatric Exam: Vascular: dorsalis pedis and posterior tibial pulses are non palpable bilateral. Capillary return is immediate. Temperature gradient is WNL. Skin turgor WNL  Sensorium: Normal Semmes Weinstein monofilament test. Normal tactile sensation bilaterally. Nail Exam: Pt has thick disfigured discolored nails with subungual debris noted bilateral entire nail hallux through fifth toenails except bilateral hallux and left fifth digit pain on palpation to the nails. Ulcer Exam: There is no evidence of ulcer or pre-ulcerative changes or infection. Orthopedic Exam: Muscle tone and strength are WNL. No limitations in general ROM. No crepitus or effusions noted.  Hammertoes noted to all the remaining digits Skin: Porokeratosis noted to the right plantar midfoot.  No pinpoint bleeding noted upon debridement.. No infection or ulcers    Assessment & Plan:  No diagnosis found.  Patient was evaluated and treated and all questions answered.  Right midfoot porokeratosis with underlying capsulitis -I explained the patient the etiology of capsulitis and various treatment options were discussed.  I believe patient will  benefit from steroid injection to help decrease the acute inflammatory component associated with pain followed by aggressive debridement.  Patient agrees with the plan like to proceed with a steroid injection. -A steroid injection was performed at right plantar midfoot using 1% plain Lidocaine and 10 mg of Kenalog. This was well tolerated.   Onychomycosis with pain  -Nails palliatively debrided as below. -Educated on self-care  Procedure: Nail Debridement Rationale: pain  Type of Debridement: manual, sharp debridement. Instrumentation: Nail nipper, rotary burr. Number of Nails: 10  Procedures and Treatment: Consent by patient was obtained for treatment procedures. The patient understood the discussion of treatment and procedures well. All questions were answered thoroughly reviewed. Debridement of mycotic and hypertrophic toenails, 1 through 5 bilateral and clearing of subungual debris. No ulceration, no infection noted.  Return Visit-Office Procedure: Patient instructed to return to the office for a follow up visit 3 months for continued evaluation and treatment.  Nicholes Rough, DPM    No follow-ups on file.

## 2020-11-09 ENCOUNTER — Ambulatory Visit (HOSPITAL_COMMUNITY): Payer: Self-pay | Admitting: Licensed Clinical Social Worker

## 2020-12-30 ENCOUNTER — Ambulatory Visit: Payer: Medicare Other | Admitting: Podiatry

## 2021-06-04 ENCOUNTER — Other Ambulatory Visit: Payer: Self-pay

## 2021-06-04 ENCOUNTER — Encounter: Payer: Self-pay | Admitting: Plastic Surgery

## 2021-06-04 ENCOUNTER — Ambulatory Visit (INDEPENDENT_AMBULATORY_CARE_PROVIDER_SITE_OTHER): Payer: Medicare (Managed Care) | Admitting: Plastic Surgery

## 2021-06-04 VITALS — BP 133/79 | HR 62 | Ht 61.0 in | Wt 146.2 lb

## 2021-06-04 DIAGNOSIS — M79604 Pain in right leg: Secondary | ICD-10-CM | POA: Diagnosis not present

## 2021-06-04 NOTE — Progress Notes (Signed)
Patient ID: Allison Beasley, female    DOB: 1961/10/07, 60 y.o.   MRN: [id number]   Chief Complaint  Patient presents with   consult    The patient is a 34 female here for evaluation of her right foot.  The patient had a large wound on her foot in 2017.  She was treated with Integra and has healed the area.  She has a long history of peripheral vascular disease, polymyositis, diabetes, rheumatoid arthritis, seizure disorder and calciphylaxis.  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  There is no wound and the skin actually looks good.  But she has a little callus that the podiatrist has been trying to soften up.  She sees Dr. Wardell Honour in [place].Marland Kitchen  She was referred by Dr. Loa Socks.  She has extreme tenderness to pressure in that area and would like to see if there is anything that can be done to decrease the pain.  Light touch she does fine but even light pressure she finds very painful.   Review of Systems  Constitutional: Negative.   HENT: Negative.    Eyes: Negative.   Respiratory: Negative.    Cardiovascular: Negative.   Gastrointestinal: Negative.   Endocrine: Negative.   Genitourinary: Negative.   Skin:  Positive for color change.  Neurological: Negative.   Hematological: Negative.   Psychiatric/Behavioral: Negative.     Past Medical History:  Diagnosis Date   Anemia    Anxiety    Constipation    Depression    Fibromyalgia    GERD (gastroesophageal reflux disease)    H. influenzae infection 2018   Headache(784.0)    Hypertension    Interstitial pulmonary disease, unspecified ([place]) 2018   Open wound of left buttock 56/2018   Osteoarthritis    Osteopenia    PAD (peripheral artery disease) (HCC)    Peripheral vascular disease (HCC)    Pneumonia 03/2012   Polymyositis (HCC)    Pressure ulcer of buttock 02/2017   right Buttock-    Pressure ulcer- Left Heel 2018   unstageable    Raynaud phenomenon    Rheumatoid arthritis (HCC)    Seizures ([place])    with chid birth  42f2 childre   Sepsis ([place] 2018   Shortness of breath    with exertion   [place] II diabetes mellitus ([place]    on no medications    Past Surgical History:  Procedure Laterality Date   ABDOMINAL AORTAGRAM N/A 07/25/2014   Procedure: ABDOMINAL AMaxcine Ham  Surgeon: CElam Dutch MD;  [facility];  Service: Cardiovascular;  Laterality: N/A;   AMPUTATION Left 12/24/2012   Procedure: AMPUTATION DIGIT left great toe;  Surgeon: CElam Dutch MD;  [place]  Service: Vascular;  Laterality: Left;  GREAT   AMPUTATION Right 05/19/2015   Procedure: AMPUTATION DIGIT;  Surgeon: CElam Dutch MD;  [place]  Service: Vascular;  Laterality: Right;   AMPUTATION Right 07/22/2015   Procedure: RAY AMPUTATION OF RIGHT GREAT TOE AMPUTATION SITE;  Surgeon: CElam Dutch MD;  [place]  Service: Vascular;  Laterality: Right;   AMPUTATION Left 05/17/2017   Procedure: AMPUTATION LEFT FIFTH TOE;  Surgeon: FElam Dutch MD;  [place]  Service: Vascular;  Laterality: Left;   APPLICATION OF A-CELL OF EXTREMITY Right 11/12/2015   Procedure: IValinda Hoar  Surgeon: CLoel LoftyDillingham, DO;  [place]  Service: Plastics;  Laterality: Right;   CESAREAN SECTION  x3   COLONOSCOPY     HYSTEROSCOPY     w/ D&C with excision of endometrial polyps   I & D EXTREMITY Right 10/26/2015   Procedure: IRRIGATION AND DEBRIDEMENT EXTREMITY/RIGHT FOOT;  Surgeon: Conrad Chautauqua, MD;  Location: [place];  Service: Vascular;  Laterality: Right;   I & D EXTREMITY Right 10/29/2015   Procedure: IRRIGATION AND DEBRIDEMENT RIGHT FOOT WITH ACELL AND VAC PLACEMENT;  Surgeon: Loel Lofty Hagop Mccollam, DO;  Location: [place];  Service: Plastics;  Laterality: Right;   I & D EXTREMITY Right 11/12/2015   Procedure: IRRIGATION AND DEBRIDEMENT RIGHT EXTREMITY;  Surgeon: Loel Lofty Malini Flemings, DO;  Location: [place];  Service: Plastics;  Laterality: Right;   IRRIGATION AND DEBRIDEMENT FOOT Right 10/26/2015   left  great toenail removal  Left 2013   done by [place] [facility]   MINOR APPLICATION OF WOUND VAC Right 11/12/2015   Procedure: MINOR APPLICATION OF WOUND VAC;  Surgeon: Loel Lofty Zeyad Delaguila, DO;  Location: [place];  Service: Plastics;  Laterality: Right;   PERIPHERAL VASCULAR CATHETERIZATION N/A 05/15/2015   Procedure: Abdominal Aortogram;  Surgeon: Elam Dutch, MD;  Location: [place] CV LAB;  Service: Cardiovascular;  Laterality: N/A;   SKIN SPLIT GRAFT Right 12/02/2015   Procedure: SKIN GRAFT SPLIT THICKNESS RIGHT FOOT WITH PLACEMENT VAC DRESSING;  Surgeon: Loel Lofty Capricia Serda, DO;  Location: [place];  Service: Plastics;  Laterality: Right;   TOE AMPUTATION Left 05/17/2017   5th toe   TONSILLECTOMY Bilateral    TOTAL HIP ARTHROPLASTY Right 08/27/2017   TOTAL HIP ARTHROPLASTY Right 08/27/2017   Procedure: TOTAL HIP ARTHROPLASTY ANTERIOR APPROACH;  Surgeon: Mcarthur Rossetti, MD;  Location: [place];  Service: Orthopedics;  Laterality: Right;   TUBAL LIGATION        Current Outpatient Medications:    alendronate (FOSAMAX) 70 MG tablet, Take by mouth., Disp: , Rfl:    amLODipine (NORVASC) 5 MG tablet, Take 5 mg by mouth daily. (0900), Disp: , Rfl:    aspirin EC 81 MG tablet, Take 1 tablet (81 mg total) 2 (two) times daily after a meal by mouth. (0900), Disp: 30 tablet, Rfl: 0   atorvastatin (LIPITOR) 80 MG tablet, Take 80 mg by mouth daily., Disp: , Rfl:    Blood Glucose Monitoring Suppl (FIFTY50 GLUCOSE METER 2.0) w/Device KIT, One Touch glucometer. Test BS daily as directed. Dx E09.9, T38.0X5A, Disp: , Rfl:    Cholecalciferol 1000 units tablet, Take 1,000 Units by mouth daily. (0900), Disp: , Rfl:    clopidogrel (PLAVIX) 75 MG tablet, Take 75 mg by mouth daily., Disp: , Rfl:    feeding supplement, ENSURE ENLIVE, (ENSURE ENLIVE) LIQD, Take 237 mLs 2 (two) times daily between meals by mouth., Disp: 237 mL, Rfl: 0   gabapentin (NEURONTIN) 300 MG capsule, Take 300 mg by mouth daily., Disp: , Rfl:     lisinopril (ZESTRIL) 10 MG tablet, Take 10 mg by mouth daily., Disp: , Rfl:    metFORMIN (GLUCOPHAGE) 500 MG tablet, Take 500 mg by mouth daily., Disp: , Rfl:    metoprolol tartrate (LOPRESSOR) 25 MG tablet, Take 25 mg by mouth 2 (two) times daily. , Disp: , Rfl:    Multiple Vitamin (MULTIVITAMIN) tablet, Take 1 tablet by mouth daily. (0900), Disp: , Rfl:    Omega-3 Fatty Acids (FISH OIL) 1000 MG CAPS, Take 1,000 mg by mouth daily. (0900), Disp: , Rfl:    oxyCODONE-acetaminophen (ROXICET) 5-325 MG tablet, Take 1-2 tablets every 4 (four)  hours as needed by mouth., Disp: 60 tablet, Rfl: 0   polyethylene glycol (MIRALAX / GLYCOLAX) packet, Take 17 g 2 (two) times daily by mouth., Disp: 14 each, Rfl: 0   predniSONE (DELTASONE) 5 MG tablet, Take 5 mg by mouth daily with breakfast., Disp: , Rfl:    risperiDONE (RISPERDAL) 0.5 MG tablet, Take 0.5 mg by mouth at bedtime. (2100), Disp: , Rfl:    Skin Protectants, Misc. (EUCERIN) cream, Apply 1 application topically daily. Apply to whole body, Disp: , Rfl:    buPROPion (WELLBUTRIN XL) 300 MG 24 hr tablet, Take by mouth., Disp: , Rfl:    DULoxetine (CYMBALTA) 30 MG capsule, Take 30 mg by mouth daily., Disp: , Rfl:    famotidine (PEPCID) 20 MG tablet, Take 20 mg by mouth 2 (two) times daily., Disp: , Rfl:    ferrous sulfate 325 (65 FE) MG EC tablet, Take by mouth., Disp: , Rfl:    Melatonin 3 MG TABS, Take 3 mg by mouth at bedtime., Disp: , Rfl:    methylPREDNISolone (MEDROL) 4 MG tablet, Take by mouth., Disp: , Rfl:    sennosides-docusate sodium (SENOKOT-S) 8.6-50 MG tablet, Take 1 tablet by mouth at bedtime., Disp: , Rfl:    Objective:   Vitals:   06/04/21 0844  BP: 133/79  Pulse: 62  SpO2: 95%    Physical Exam Vitals and nursing note reviewed.  Constitutional:      Appearance: Normal appearance.  HENT:     Head: Normocephalic and atraumatic.  Cardiovascular:     Rate and Rhythm: Normal rate.     Pulses: Normal pulses.  Pulmonary:      Effort: Pulmonary effort is normal.  Neurological:     Mental Status: She is alert and oriented to person, place, and time. Mental status is at baseline.  Psychiatric:        Mood and Affect: Mood normal.        Behavior: Behavior normal.    Assessment & Plan:  Pain of right lower extremity I am concerned this could be a neuroma.  I do not see anything that looks like infection or soft tissue injury.  It could also be a deep callus.  I tried to call both Dr. Loa Socks and Dr. Wardell Honour.  I ended up in a never ending phone tree at [facility].  I was transferred a number of times.  Therefore I picked up the phone and called Dr. Doran Durand who agreed to see her.  The consult has been made. Pictures were obtained of the patient and placed in the chart with the patient's or guardian's permission.  [place], DO
